# Patient Record
Sex: Female | Born: 1940
Health system: Southern US, Community
[De-identification: ages and names within clinical notes are randomized; demographics above are authoritative.]

## PROBLEM LIST (undated history)

## (undated) DIAGNOSIS — E785 Hyperlipidemia, unspecified: Secondary | ICD-10-CM

## (undated) DIAGNOSIS — I10 Essential (primary) hypertension: Secondary | ICD-10-CM

## (undated) DIAGNOSIS — M1990 Unspecified osteoarthritis, unspecified site: Secondary | ICD-10-CM

## (undated) DIAGNOSIS — R319 Hematuria, unspecified: Secondary | ICD-10-CM

## (undated) DIAGNOSIS — K219 Gastro-esophageal reflux disease without esophagitis: Secondary | ICD-10-CM

## (undated) DIAGNOSIS — E039 Hypothyroidism, unspecified: Secondary | ICD-10-CM

## (undated) HISTORY — PX: EYE SURGERY: SHX253

## (undated) HISTORY — PX: OTHER SURGICAL HISTORY: SHX169

## (undated) HISTORY — DX: Hematuria, unspecified: R31.9

## (undated) HISTORY — PX: JOINT REPLACEMENT: SHX530

## (undated) HISTORY — PX: BACK SURGERY: SHX140

## (undated) HISTORY — PX: HAND SURGERY: SHX662

## (undated) HISTORY — PX: APPENDECTOMY: SHX54

## (undated) HISTORY — PX: VAGINAL HYSTERECTOMY: SHX2639

## (undated) HISTORY — PX: ABDOMINAL HYSTERECTOMY: SHX81

## (undated) HISTORY — DX: Hyperlipidemia, unspecified: E78.5

---

## 1998-12-09 ENCOUNTER — Other Ambulatory Visit: Admission: RE | Admit: 1998-12-09 | Discharge: 1998-12-09 | Payer: Self-pay | Admitting: Obstetrics and Gynecology

## 2000-03-21 ENCOUNTER — Other Ambulatory Visit: Admission: RE | Admit: 2000-03-21 | Discharge: 2000-03-21 | Payer: Self-pay | Admitting: Obstetrics and Gynecology

## 2000-05-16 ENCOUNTER — Ambulatory Visit (HOSPITAL_COMMUNITY): Admission: RE | Admit: 2000-05-16 | Discharge: 2000-05-16 | Payer: Self-pay | Admitting: Orthopedic Surgery

## 2000-05-16 ENCOUNTER — Encounter: Payer: Self-pay | Admitting: Orthopedic Surgery

## 2000-05-31 ENCOUNTER — Encounter: Payer: Self-pay | Admitting: Orthopedic Surgery

## 2000-05-31 ENCOUNTER — Ambulatory Visit (HOSPITAL_COMMUNITY): Admission: RE | Admit: 2000-05-31 | Discharge: 2000-05-31 | Payer: Self-pay | Admitting: Orthopedic Surgery

## 2000-06-17 ENCOUNTER — Encounter: Payer: Self-pay | Admitting: Orthopedic Surgery

## 2000-06-17 ENCOUNTER — Ambulatory Visit (HOSPITAL_COMMUNITY): Admission: RE | Admit: 2000-06-17 | Discharge: 2000-06-17 | Payer: Self-pay | Admitting: Orthopedic Surgery

## 2000-07-01 ENCOUNTER — Ambulatory Visit (HOSPITAL_COMMUNITY): Admission: RE | Admit: 2000-07-01 | Discharge: 2000-07-01 | Payer: Self-pay | Admitting: Internal Medicine

## 2000-07-01 ENCOUNTER — Encounter (HOSPITAL_BASED_OUTPATIENT_CLINIC_OR_DEPARTMENT_OTHER): Payer: Self-pay | Admitting: Internal Medicine

## 2000-10-10 ENCOUNTER — Encounter: Admission: RE | Admit: 2000-10-10 | Discharge: 2000-10-10 | Payer: Self-pay | Admitting: Obstetrics and Gynecology

## 2000-10-10 ENCOUNTER — Encounter: Payer: Self-pay | Admitting: Obstetrics and Gynecology

## 2001-04-03 ENCOUNTER — Other Ambulatory Visit: Admission: RE | Admit: 2001-04-03 | Discharge: 2001-04-03 | Payer: Self-pay | Admitting: Obstetrics and Gynecology

## 2001-11-10 ENCOUNTER — Encounter: Admission: RE | Admit: 2001-11-10 | Discharge: 2001-11-10 | Payer: Self-pay | Admitting: Obstetrics and Gynecology

## 2001-11-10 ENCOUNTER — Encounter: Payer: Self-pay | Admitting: Obstetrics and Gynecology

## 2002-04-05 ENCOUNTER — Other Ambulatory Visit: Admission: RE | Admit: 2002-04-05 | Discharge: 2002-04-05 | Payer: Self-pay | Admitting: Obstetrics and Gynecology

## 2002-11-15 ENCOUNTER — Encounter: Payer: Self-pay | Admitting: Obstetrics and Gynecology

## 2002-11-15 ENCOUNTER — Encounter: Admission: RE | Admit: 2002-11-15 | Discharge: 2002-11-15 | Payer: Self-pay | Admitting: Obstetrics and Gynecology

## 2003-05-13 ENCOUNTER — Encounter: Admission: RE | Admit: 2003-05-13 | Discharge: 2003-05-13 | Payer: Self-pay | Admitting: Obstetrics and Gynecology

## 2003-05-13 ENCOUNTER — Encounter: Payer: Self-pay | Admitting: Obstetrics and Gynecology

## 2003-11-26 ENCOUNTER — Encounter: Admission: RE | Admit: 2003-11-26 | Discharge: 2003-11-26 | Payer: Self-pay | Admitting: Obstetrics and Gynecology

## 2004-12-07 ENCOUNTER — Encounter: Admission: RE | Admit: 2004-12-07 | Discharge: 2004-12-07 | Payer: Self-pay | Admitting: Internal Medicine

## 2005-01-27 ENCOUNTER — Ambulatory Visit: Payer: Self-pay | Admitting: Infectious Diseases

## 2005-01-28 ENCOUNTER — Ambulatory Visit (HOSPITAL_COMMUNITY): Admission: RE | Admit: 2005-01-28 | Discharge: 2005-01-28 | Payer: Self-pay | Admitting: Infectious Diseases

## 2005-03-28 ENCOUNTER — Observation Stay (HOSPITAL_COMMUNITY): Admission: EM | Admit: 2005-03-28 | Discharge: 2005-03-29 | Payer: Self-pay | Admitting: Emergency Medicine

## 2006-02-22 ENCOUNTER — Encounter: Admission: RE | Admit: 2006-02-22 | Discharge: 2006-02-22 | Payer: Self-pay | Admitting: Internal Medicine

## 2006-03-09 ENCOUNTER — Encounter: Admission: RE | Admit: 2006-03-09 | Discharge: 2006-03-09 | Payer: Self-pay | Admitting: Internal Medicine

## 2006-08-15 ENCOUNTER — Ambulatory Visit (HOSPITAL_COMMUNITY): Admission: RE | Admit: 2006-08-15 | Discharge: 2006-08-16 | Payer: Self-pay | Admitting: Surgery

## 2006-08-15 ENCOUNTER — Encounter (INDEPENDENT_AMBULATORY_CARE_PROVIDER_SITE_OTHER): Payer: Self-pay | Admitting: *Deleted

## 2006-11-25 ENCOUNTER — Encounter: Admission: RE | Admit: 2006-11-25 | Discharge: 2006-11-25 | Payer: Self-pay | Admitting: Obstetrics and Gynecology

## 2007-03-23 ENCOUNTER — Encounter: Admission: RE | Admit: 2007-03-23 | Discharge: 2007-03-23 | Payer: Self-pay | Admitting: Internal Medicine

## 2008-04-30 ENCOUNTER — Encounter: Admission: RE | Admit: 2008-04-30 | Discharge: 2008-04-30 | Payer: Self-pay | Admitting: Internal Medicine

## 2009-05-01 ENCOUNTER — Encounter: Admission: RE | Admit: 2009-05-01 | Discharge: 2009-05-01 | Payer: Self-pay | Admitting: Internal Medicine

## 2009-11-07 DIAGNOSIS — I319 Disease of pericardium, unspecified: Secondary | ICD-10-CM | POA: Insufficient documentation

## 2009-11-07 DIAGNOSIS — E785 Hyperlipidemia, unspecified: Secondary | ICD-10-CM | POA: Insufficient documentation

## 2009-11-07 DIAGNOSIS — K2990 Gastroduodenitis, unspecified, without bleeding: Secondary | ICD-10-CM | POA: Insufficient documentation

## 2009-11-14 DIAGNOSIS — Z7901 Long term (current) use of anticoagulants: Secondary | ICD-10-CM | POA: Insufficient documentation

## 2010-05-06 ENCOUNTER — Encounter: Admission: RE | Admit: 2010-05-06 | Discharge: 2010-05-06 | Payer: Self-pay | Admitting: Internal Medicine

## 2010-07-21 ENCOUNTER — Emergency Department (HOSPITAL_COMMUNITY): Admission: EM | Admit: 2010-07-21 | Discharge: 2010-07-21 | Payer: Self-pay | Admitting: Emergency Medicine

## 2010-12-30 LAB — BASIC METABOLIC PANEL
BUN: 23 mg/dL (ref 6–23)
CO2: 26 mEq/L (ref 19–32)
Calcium: 9.5 mg/dL (ref 8.4–10.5)
Chloride: 108 mEq/L (ref 96–112)
Creatinine, Ser: 0.62 mg/dL (ref 0.4–1.2)
GFR calc Af Amer: >60 mL/min (ref >60)
GFR calc non Af Amer: >60 mL/min (ref >60)
Glucose, Bld: 108 mg/dL — ABNORMAL HIGH (ref 70–99)
Potassium: 4.2 mEq/L (ref 3.5–5.1)
Sodium: 142 mEq/L (ref 135–145)

## 2010-12-30 LAB — DIFFERENTIAL
Basophils Absolute: 0 10*3/uL (ref 0.0–0.1)
Basophils Relative: 0 % (ref 0–1)
Eosinophils Absolute: 0.1 10*3/uL (ref 0.0–0.7)
Eosinophils Relative: 2 % (ref 0–5)
Lymphocytes Relative: 39 % (ref 12–46)
Lymphs Abs: 2 10*3/uL (ref 0.7–4.0)
Monocytes Absolute: 0.4 10*3/uL (ref 0.1–1.0)
Monocytes Relative: 9 % (ref 3–12)
Neutro Abs: 2.6 10*3/uL (ref 1.7–7.7)
Neutrophils Relative %: 50 % (ref 43–77)

## 2010-12-30 LAB — URINE MICROSCOPIC-ADD ON

## 2010-12-30 LAB — URINALYSIS, ROUTINE W REFLEX MICROSCOPIC
Bilirubin Urine: NEGATIVE
Ketones, ur: NEGATIVE mg/dL
Leukocytes, UA: NEGATIVE
Nitrite: NEGATIVE
Protein, ur: NEGATIVE mg/dL
Specific Gravity, Urine: 1.018 (ref 1.005–1.030)
Urine Glucose, Fasting: NEGATIVE mg/dL
Urobilinogen, UA: 0.2 mg/dL (ref 0.0–1.0)
pH: 7 (ref 5.0–8.0)

## 2010-12-30 LAB — CBC
HCT: 40.5 % (ref 36.0–46.0)
Hemoglobin: 13.4 g/dL (ref 12.0–15.0)
MCH: 31.3 pg (ref 26.0–34.0)
MCHC: 33.1 g/dL (ref 30.0–36.0)
MCV: 94.6 fL (ref 78.0–100.0)
Platelets: 287 10*3/uL (ref 150–400)
RBC: 4.28 MIL/uL (ref 3.87–5.11)
RDW: 13 % (ref 11.5–15.5)
WBC: 5.1 10*3/uL (ref 4.0–10.5)

## 2010-12-30 LAB — PROTIME-INR
INR: 0.97 (ref 0.00–1.49)
Prothrombin Time: 13.1 seconds (ref 11.6–15.2)

## 2010-12-30 LAB — TYPE AND SCREEN
ABO/RH(D): A POS
Antibody Screen: NEGATIVE

## 2010-12-30 LAB — ABO/RH: ABO/RH(D): A POS

## 2010-12-30 LAB — APTT: aPTT: 27 seconds (ref 24–37)

## 2011-01-04 ENCOUNTER — Ambulatory Visit (HOSPITAL_COMMUNITY)
Admission: RE | Admit: 2011-01-04 | Discharge: 2011-01-06 | Payer: Self-pay | Source: Home / Self Care | Attending: Orthopedic Surgery | Admitting: Orthopedic Surgery

## 2011-01-11 LAB — CBC
HCT: 36.3 % (ref 36.0–46.0)
Hemoglobin: 12.2 g/dL (ref 12.0–15.0)
MCH: 32 pg (ref 26.0–34.0)
MCHC: 33.6 g/dL (ref 30.0–36.0)
MCV: 95.3 fL (ref 78.0–100.0)
Platelets: 254 10*3/uL (ref 150–400)
RBC: 3.81 MIL/uL — ABNORMAL LOW (ref 3.87–5.11)
RDW: 13 % (ref 11.5–15.5)
WBC: 7.4 10*3/uL (ref 4.0–10.5)

## 2011-01-11 LAB — BASIC METABOLIC PANEL
BUN: 10 mg/dL (ref 6–23)
CO2: 28 mEq/L (ref 19–32)
Calcium: 8.9 mg/dL (ref 8.4–10.5)
Chloride: 105 mEq/L (ref 96–112)
Creatinine, Ser: 0.62 mg/dL (ref 0.4–1.2)
GFR calc Af Amer: >60 mL/min (ref >60)
GFR calc non Af Amer: >60 mL/min (ref >60)
Glucose, Bld: 125 mg/dL — ABNORMAL HIGH (ref 70–99)
Potassium: 3.7 mEq/L (ref 3.5–5.1)
Sodium: 140 mEq/L (ref 135–145)

## 2011-01-17 ENCOUNTER — Encounter (HOSPITAL_BASED_OUTPATIENT_CLINIC_OR_DEPARTMENT_OTHER): Payer: Self-pay | Admitting: Internal Medicine

## 2011-01-18 NOTE — H&P (Signed)
NAMESHARLENE, MCCLUSKEY              ACCOUNT NO.:  [id number]  MEDICAL RECORD NO.:  [id number]          PATIENT TYPE:  INP  LOCATION:  NA                           FACILITY:  [facility]  PHYSICIAN:  Madlyn Frankel. Stephanie Baker, M.D.  DATE OF BIRTH:  [birth year]  DATE OF ADMISSION: DATE OF DISCHARGE:                             HISTORY & PHYSICAL   ADMISSION DIAGNOSIS:  Right knee medial osteoarthritis with a planned right hemi-knee arthroplasty.  BRIEF HISTORY:  This patient is well known to this practice and has been seen by Dr. Shelle Iron with this practice for some time due to right knee pain.  She has had progressive problems and pain on the medial side of her knee and underwent an MRI which Dr. Charlann Baker reviewed with her at last appointment.  She has been using a cane and feels like the discomfort worsened to the point that she has failed conservative treatment and ready to proceed with hemiarthroplasty on that side.  PAST MEDICAL HISTORY:  Significant for: 1. Impaired vision.  She does wear corrective lenses. 2. She has hypertension, is well controlled. 3. She is postmenopausal and she has generalized osteoarthritis with     some joint pain, swelling, and morning stiffness.  SURGICAL HISTORY:  Significant for: 1. Repair of MP joint of her left thumb. 2. She had an appendectomy in the past and hysterectomy in the past.  CURRENT MEDICATION LIST:  Benicar HCT 40/12.5 mg every day, amlodipine 3.5 mg a day, potassium 20 mEq a day, vitamin D daily, Biotin daily, Premarin and fish oil omega 3 daily, low-dose aspirin every day, and meloxicam 7.5 daily.  ALLERGIES:  She has no medicine allergies.  SOCIAL HISTORY:  The patient is married.  She was a physical therapist. She has a past history of tobacco use.  She drinks alcohol socially. She has no history of street drug abuse.  FAMILY HISTORY:  Her father died at 8 of melanoma.  Mother had a heart attack at 90.  She has 2 children.  DISPOSITION PLAN:   Home after discharge.  REVIEW OF SYSTEMS:  Notable for the difficulties described in the history of present illness and past medical history.  Review of systems is otherwise unremarkable.  PHYSICAL EXAM:  GENERAL:  Shows the patient to be at 550 pounds. General health is good.  She is postmenopausal. VITAL SIGNS:  Her blood pressure today is 104/66, respirations 20, pulse 80. HEENT:  Shows corrective lenses only. NECK AND CHEST:  Unremarkable.  Clear to auscultation. HEART:  S1, S2.  No murmurs, rubs, or gallops.  She does have history of hypertension. ABDOMEN:  Soft, nondistended. GI/GU:  Otherwise unremarkable. EXTREMITIES:  Exam shows history osteoarthritis. DERMATOLOGIC/NEUROLOGIC:  She is intact.  LABS:  EKG and chest x-ray are pending through [facility].  IMPRESSION:  Right medial knee osteoarthritis with pending hemiarthroplasty.  PLAN:  She will be admitted on the 9th for the hemiarthroplasty with Dr. Charlann Baker.  Her questions were encouraged and answered.     Russell L. Webb Silversmith, RN   ______________________________ Madlyn Frankel Stephanie Baker, M.D.    RLW/MEDQ  D:  12/31/2010  T:  12/31/2010  Job:  [id number]  Electronically Signed by Lauree Chandler NP-C on 01/15/2011 09:46:46 AM Electronically Signed by Durene Romans M.D. on 01/18/2011 09:33:01 AM

## 2011-01-21 LAB — SURGICAL PCR SCREEN: MRSA, PCR: NEGATIVE

## 2011-04-19 ENCOUNTER — Other Ambulatory Visit (HOSPITAL_BASED_OUTPATIENT_CLINIC_OR_DEPARTMENT_OTHER): Payer: Self-pay | Admitting: Internal Medicine

## 2011-04-19 DIAGNOSIS — Z1231 Encounter for screening mammogram for malignant neoplasm of breast: Secondary | ICD-10-CM

## 2011-05-12 ENCOUNTER — Ambulatory Visit
Admission: RE | Admit: 2011-05-12 | Discharge: 2011-05-12 | Disposition: A | Discharge status: Home / Self Care | Payer: Medicare Other | Source: Ambulatory Visit | Attending: Internal Medicine | Admitting: Internal Medicine

## 2011-05-12 DIAGNOSIS — Z1231 Encounter for screening mammogram for malignant neoplasm of breast: Secondary | ICD-10-CM

## 2011-05-14 NOTE — Op Note (Signed)
NAMEDEXTER, SAUSER              ACCOUNT NO.:  [id number]   MEDICAL RECORD NO.:  [id number]          PATIENT TYPE:  AMB   LOCATION:  DAY                          FACILITY:  [facility]   PHYSICIAN:  Currie Paris, M.D.DATE OF BIRTH:  March 28, 1941   DATE OF PROCEDURE:  08/15/2006  DATE OF DISCHARGE:                                 OPERATIVE REPORT   PREOPERATIVE DIAGNOSIS:  Chronic appendicitis.   POSTOPERATIVE DIAGNOSIS:  Chronic appendicitis.   OPERATION:  Laparoscopic appendectomy.   SURGEON:  Dr. Jamey Ripa.   ANESTHESIA:  General.   CLINICAL HISTORY:  This is a 70 year old lady who about 7 or 8 weeks ago had  an episode of appendicitis which presented as a phlegmon.  She was treated  nonoperatively with IV antibiotics and oral antibiotics.  Follow-up CT scan  has showed marked resolution of the inflammatory process.  She was brought  in electively for an interval appendectomy.   DESCRIPTION OF PROCEDURE:  The patient was seen in the holding area, and she  had no further questions.  We confirmed appendectomy as the planned  procedure.   She was taken to the operating room, and after satisfactory general  anesthesia had been obtained, Foley catheter was placed and the abdomen  prepped and draped.  The time-out occurred.   A 0.25% plain Marcaine for each incision.  The umbilical incision was made.  The fascia identified and opened, and the peritoneal cavity entered under  direct vision.  A pursestring was placed, the [facility] introduced and the  abdomen insufflated to 15.  With the camera placed, I put a 5-mm trocar in  the right upper quadrant and a 10/11 in the left lower quadrant.  Exploration of the abdomen with a scope showed no gross abnormalities.  There was some inflammatory process over by the appendix.   With the camera in the left lower quadrant port, I was able to free up some  of the chronic inflammatory changes around lateral edge of the cecum and  identify the  tip of the appendix.  With that grasped, I was able to see that  there was a little fat from the terminal ileum that was stuck to the  appendix, and this was taken down with the harmonic.  This gave me a little  better exposure, and it appeared she had perforated her appendix near the  tip.  I was able to grasp the appendix just below that.  I then was able to  divide the mesoappendix with the harmonic.   Once I had this divided down to the base the appendix, the GIA was placed  through the umbilical port and the appendix divided at the base.  Appendix  was placed in a bag and brought out the umbilical port.   The abdomen was reinsufflated.  I irrigated and made sure the area was dry,  that staple line appeared intact and there  were no other problems.  The ports were then removed.  The 5-mm port was  removed, and there was no bleeding.  I then removed the 10/11 port under  direct vision.  There was no bleeding.  The umbilical port was removed and  the  abdomen deflated and a pursestring tied down to close this.  The skin was  closed with 4-0 Monocryl subcuticular.  I alsoput a fascial suture in left  lower quadrant incision as well using a 0 Vicryl.   The patient tolerated the procedure well.  There were no complications, and  all counts were correct.      Currie Paris, M.D.  Electronically Signed     CJS/MEDQ  D:  08/15/2006  T:  08/15/2006  Job:  [id number]   cc:   Barry Dienes. Eloise Harman, M.D.  Fax: [phone]   Vania Rea. Jarold Motto, MD, FACG, FACP  [address]  [place]  [place] [address]

## 2011-05-14 NOTE — H&P (Signed)
NAMENICOLETTA, HUSH NO.:  [id number]   MEDICAL RECORD NO.:  [id number]          PATIENT TYPE:  INP   LOCATION:  2017                         FACILITY:  [facility]   PHYSICIAN:  Darden Palmer., M.D.DATE OF BIRTH:  [birth year]   DATE OF ADMISSION:  03/28/2005  DATE OF DISCHARGE:                                HISTORY & PHYSICAL   HISTORY:  This is a 70 year old female who was admitted as an emergency  patient for evaluation of chest pain. The patient has a long history of  hypertension which has been somewhat difficult to control by her primary  doctor and was recently started on Norvasc. She reports a 2 to 3 day history  of atypical right-sided chest pain. She describes this pain as being worse  with lying down and it was described as an intense right-sided pain that  would occasionally become worse and stabbing. The pain has persisted but  intensified last night. It worsened when she was up or about and perhaps  when she would bend over. Associated with this would be a numbness and heavy  feeling involving her right arm. Because of the pain she presented to the  emergency room. She is having minimal pain now but still some pleuritic  features to it and is admitted to rule out a myocardial infarction.   PAST MEDICAL HISTORY:  1.  Remarkable for hypertension.  2.  Remote history of hyperlipidemia.  3.  There have been some mild reflux in the past.   PREVIOUS SURGERY:  Hysterectomy and hand surgery.   ALLERGIES:  None.   CURRENT MEDICATIONS:  1.  Micardis 80 mg daily.  2.  Toprol XL 12.5 mg daily.  3.  Norvasc 2.5 mg daily.  4.  Aspirin daily.  5.  Premarin.   FAMILY HISTORY:  Mother died of an infarction at age 62. Father died of  melanoma at age 66. She is the oldest of four children. Her three siblings  are healthy except for a sister with diabetes.   SOCIAL HISTORY:  She smoked heavily but quit 15 years ago, smoked 1 to 1.5  packs of cigarettes  per day for around 30 years. She drinks 2 to 3 glasses  of wine per day. She is married. Her husband is a retired Company secretary. 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   REVIEW OF SYSTEMS:  A 10 pound weight gain. No skin problems. No eye, ear,  nose or throat problems. No difficulty swallowing. Remote history of hiatal  hernia and reflux symptoms but these do not resemble that. No urinary tract  infections, or other symptoms.  Occasional mild arthritis. No history of  stroke or TIA.   PHYSICAL EXAMINATION:  GENERAL:  She is a pleasant female appearing her  stated age and in no acute distress.  VITAL SIGNS:  Blood pressure is 160/70, pulse 72.  SKIN:  Warm and dry.  HEENT:  PERRLA, CNS clear. Fundi not examined. Pharynx negative.  NECK:  Supple without masses, JVD, thyromegaly or bruits.  LUNGS:  Clear to A&P.  CARDIAC EXAM:  Normal S1 and S2, no S3. No murmur.  ABDOMEN:  Soft, nontender, no mass, hepatosplenomegaly or aneurysm. Femoral  distal pulses 2+, no edema.   A 12-lead EKG shows left anterior fascicular block, there is left atrial  enlargement also noted.  Chest x-ray is normal. She has a slight monocytosis  on her lab work. CBC and initial cardiac enzymes were all normal.   IMPRESSION:  1.  Right-sided chest pain with some atypical features. Rule out pulmonary      embolus, myocardial infarction or primary pulmonary problem.  2.  Hypertension which has been difficult to control.  3.  History of hyperlipidemia previously tried on Zetia. Questionable      intolerance to statins in the past but incomplete data base.   RECOMMENDATIONS:  Admit, begin Lovenox, increase Norvasc. Check CT scan of  chest. Further work up per Dr. Patty Sermons.      WST/MEDQ  D:  03/28/2005  T:  03/28/2005  Job:  [id number]   cc:   Barry Dienes. Eloise Harman, M.D.  [address]  [place]  [place] [address]  Fax: [phone]   Cassell Clement, M.D.  [address]  [place]  [place] [address]  Fax: [phone]

## 2012-01-07 DIAGNOSIS — M19079 Primary osteoarthritis, unspecified ankle and foot: Secondary | ICD-10-CM | POA: Diagnosis not present

## 2012-03-01 DIAGNOSIS — D1801 Hemangioma of skin and subcutaneous tissue: Secondary | ICD-10-CM | POA: Diagnosis not present

## 2012-03-01 DIAGNOSIS — L821 Other seborrheic keratosis: Secondary | ICD-10-CM | POA: Diagnosis not present

## 2012-03-01 DIAGNOSIS — D239 Other benign neoplasm of skin, unspecified: Secondary | ICD-10-CM | POA: Diagnosis not present

## 2012-03-03 DIAGNOSIS — H02059 Trichiasis without entropian unspecified eye, unspecified eyelid: Secondary | ICD-10-CM | POA: Diagnosis not present

## 2012-03-03 DIAGNOSIS — H01009 Unspecified blepharitis unspecified eye, unspecified eyelid: Secondary | ICD-10-CM | POA: Diagnosis not present

## 2012-04-03 ENCOUNTER — Other Ambulatory Visit: Payer: Self-pay | Admitting: Internal Medicine

## 2012-04-03 DIAGNOSIS — Z1231 Encounter for screening mammogram for malignant neoplasm of breast: Secondary | ICD-10-CM

## 2012-04-06 DIAGNOSIS — H04129 Dry eye syndrome of unspecified lacrimal gland: Secondary | ICD-10-CM | POA: Diagnosis not present

## 2012-04-06 DIAGNOSIS — H01009 Unspecified blepharitis unspecified eye, unspecified eyelid: Secondary | ICD-10-CM | POA: Diagnosis not present

## 2012-05-12 DIAGNOSIS — M1710 Unilateral primary osteoarthritis, unspecified knee: Secondary | ICD-10-CM | POA: Diagnosis not present

## 2012-05-12 DIAGNOSIS — IMO0002 Reserved for concepts with insufficient information to code with codable children: Secondary | ICD-10-CM | POA: Diagnosis not present

## 2012-05-15 ENCOUNTER — Ambulatory Visit
Admission: RE | Admit: 2012-05-15 | Discharge: 2012-05-15 | Disposition: A | Discharge status: Home / Self Care | Payer: PRIVATE HEALTH INSURANCE | Source: Ambulatory Visit | Attending: Internal Medicine | Admitting: Internal Medicine

## 2012-05-15 DIAGNOSIS — Z1231 Encounter for screening mammogram for malignant neoplasm of breast: Secondary | ICD-10-CM | POA: Diagnosis not present

## 2012-09-18 DIAGNOSIS — L820 Inflamed seborrheic keratosis: Secondary | ICD-10-CM | POA: Diagnosis not present

## 2012-09-18 DIAGNOSIS — D485 Neoplasm of uncertain behavior of skin: Secondary | ICD-10-CM | POA: Diagnosis not present

## 2012-09-18 DIAGNOSIS — L819 Disorder of pigmentation, unspecified: Secondary | ICD-10-CM | POA: Diagnosis not present

## 2012-09-18 DIAGNOSIS — L578 Other skin changes due to chronic exposure to nonionizing radiation: Secondary | ICD-10-CM | POA: Diagnosis not present

## 2012-10-10 DIAGNOSIS — Z23 Encounter for immunization: Secondary | ICD-10-CM | POA: Diagnosis not present

## 2012-10-20 DIAGNOSIS — IMO0002 Reserved for concepts with insufficient information to code with codable children: Secondary | ICD-10-CM | POA: Diagnosis not present

## 2012-10-20 DIAGNOSIS — M1710 Unilateral primary osteoarthritis, unspecified knee: Secondary | ICD-10-CM | POA: Diagnosis not present

## 2012-10-25 DIAGNOSIS — M1710 Unilateral primary osteoarthritis, unspecified knee: Secondary | ICD-10-CM | POA: Diagnosis not present

## 2012-10-25 DIAGNOSIS — IMO0002 Reserved for concepts with insufficient information to code with codable children: Secondary | ICD-10-CM | POA: Diagnosis not present

## 2012-10-26 ENCOUNTER — Encounter (HOSPITAL_COMMUNITY): Payer: Self-pay | Admitting: Pharmacy Technician

## 2012-11-01 ENCOUNTER — Encounter (HOSPITAL_COMMUNITY): Payer: Self-pay

## 2012-11-01 ENCOUNTER — Encounter (HOSPITAL_COMMUNITY)
Admission: RE | Admit: 2012-11-01 | Discharge: 2012-11-01 | Disposition: A | Discharge status: Home / Self Care | Payer: Medicare Other | Source: Ambulatory Visit | Attending: Orthopedic Surgery | Admitting: Orthopedic Surgery

## 2012-11-01 ENCOUNTER — Ambulatory Visit (HOSPITAL_COMMUNITY)
Admission: RE | Admit: 2012-11-01 | Discharge: 2012-11-01 | Disposition: A | Discharge status: Home / Self Care | Payer: Medicare Other | Source: Ambulatory Visit | Attending: Orthopedic Surgery | Admitting: Orthopedic Surgery

## 2012-11-01 DIAGNOSIS — I10 Essential (primary) hypertension: Secondary | ICD-10-CM | POA: Insufficient documentation

## 2012-11-01 DIAGNOSIS — Z87891 Personal history of nicotine dependence: Secondary | ICD-10-CM | POA: Insufficient documentation

## 2012-11-01 DIAGNOSIS — Z01818 Encounter for other preprocedural examination: Secondary | ICD-10-CM | POA: Insufficient documentation

## 2012-11-01 HISTORY — DX: Essential (primary) hypertension: I10

## 2012-11-01 HISTORY — DX: Gastro-esophageal reflux disease without esophagitis: K21.9

## 2012-11-01 LAB — URINALYSIS, ROUTINE W REFLEX MICROSCOPIC
Bilirubin Urine: NEGATIVE
Nitrite: NEGATIVE
Protein, ur: NEGATIVE mg/dL
Specific Gravity, Urine: 1.018 (ref 1.005–1.030)
Urobilinogen, UA: 0.2 mg/dL (ref 0.0–1.0)

## 2012-11-01 LAB — CBC
Hemoglobin: 14.1 g/dL (ref 12.0–15.0)
RBC: 4.5 MIL/uL (ref 3.87–5.11)

## 2012-11-01 LAB — BASIC METABOLIC PANEL
GFR calc Af Amer: >90 mL/min (ref >90)
GFR calc non Af Amer: 89 mL/min — ABNORMAL LOW (ref >90)
Potassium: 3.8 mEq/L (ref 3.5–5.1)
Sodium: 137 mEq/L (ref 135–145)

## 2012-11-01 LAB — SURGICAL PCR SCREEN: Staphylococcus aureus: NEGATIVE

## 2012-11-01 LAB — APTT: aPTT: 28 seconds (ref 24–37)

## 2012-11-01 LAB — PROTIME-INR
INR: 0.89 (ref 0.00–1.49)
Prothrombin Time: 12 seconds (ref 11.6–15.2)

## 2012-11-01 NOTE — Patient Instructions (Signed)
[address] T Ammirati  11/01/2012   Your procedure is scheduled on:  11/14/12  Tuesday   Surgery  [id number]  Report to [facility] at    0515   AM.  Call this number if you have problems the morning of surgery: [phone]      Remember:   Do not eat food  Or drink :After Midnight.   Monday NIGHT   Take these medicines the morning of surgery with A SIP OF WATER: AMLODIPINE   .  Contacts, dentures or partial plates can not be worn to surgery  Leave suitcase in the car. After surgery it may be brought to your room.  For patients admitted to the hospital, checkout time is 11:00 AM day of  discharge.             SPECIAL INSTRUCTIONS- SEE [place] PREPARING FOR SURGERY INSTRUCTION SHEET-     DO NOT WEAR JEWELRY, LOTIONS, POWDERS, OR PERFUMES.  WOMEN-- DO NOT SHAVE LEGS OR UNDERARMS FOR 12 HOURS BEFORE SHOWERS. MEN MAY SHAVE FACE.  Patients discharged the day of surgery will not be allowed to drive home. IF going home the day of surgery, you must have a driver and someone to stay with you for the first 24 hours  Name and phone number of your driver: will be admitted                                                                       Please read over the following fact sheets that you were given: MRSA Information, Incentive Spirometry Sheet, Blood Transfusion Sheet  Information                                                                                   Pattiann Solanki  PST [phone]  [phone]

## 2012-11-01 NOTE — Progress Notes (Signed)
Faxed abnormal urine with micro, CMET to Dr Charlann Boxer with note at 1230 that patient is leaving to go out of country and will be unavailable after 0530 tomorrow  with confirmation was received.   1505- patient notified of neg pcr at her request- she stated she was informed by Dr Boneta Lucks office that medication has been called in for UTI

## 2012-11-02 NOTE — Progress Notes (Signed)
Clearance Dr Paterson on chart 

## 2012-11-12 NOTE — H&P (Signed)
TOTAL KNEE ADMISSION H&P  Patient is being admitted for left medial unicompartment knee arthroplasty.  Subjective:  Chief Complaint:  Left knee medial compartment OA / pain.  HPI: Stephanie Baker, 71 y.o. female, has a history of pain and functional disability in the left knee due to arthritis and has failed non-surgical conservative treatments for greater than 12 weeks to includeNSAID's and/or analgesics and activity modification.  Onset of symptoms was gradual, starting 3 years ago with gradually worsening course since that time. The patient noted no past surgery on the left knee(s).  Patient currently rates pain in the left knee(s) at 7 out of 10 with activity. Patient has worsening of pain with activity and weight bearing, pain that interferes with activities of daily living, crepitus and joint swelling.  Patient has evidence of periarticular osteophytes and joint space narrowing by imaging studies. Risks, benefits and expectations were discussed with the patient. Patient understand the risks, benefits and expectations and wishes to proceed with surgery.   D/C Plans:  Home with HHPT  Post-op Meds:   Rx given for ASA, Robaxin, Celebrex, Iron, Colace and MiraLax  Tranexamic Acid:  To be given  Decadron:   To be given  FYI:  Hydrocodone is ok to try at first, if causes any effects then switch to Toradol and tramadol  Past Medical History  Diagnosis Date  . Hypertension   . GERD (gastroesophageal reflux disease)     Past Surgical History  Procedure Date  . Vaginal hysterectomy   . Appendectomy   . Joint replacement     right knee  . Thumb surgery     left  . Eye surgery     bilateral cataract extraction with IOL    No prescriptions prior to admission   No Known Allergies  History  Substance Use Topics  . Smoking status: Never Smoker   . Smokeless tobacco: Never Used  . Alcohol Use: Yes     Comment: wine nightly   1 glass       Review of Systems  Constitutional:  Negative.   HENT: Negative.   Eyes: Negative.   Respiratory: Negative.   Cardiovascular: Negative.   Gastrointestinal: Negative.   Genitourinary: Negative.   Musculoskeletal: Positive for joint pain.  Skin: Negative.   Neurological: Negative.   Endo/Heme/Allergies: Negative.   Psychiatric/Behavioral: Negative.     Objective:  Physical Exam  Constitutional: She is oriented to person, place, and time. She appears well-developed and well-nourished.  HENT:  Head: Normocephalic and atraumatic.  Mouth/Throat: Oropharynx is clear and moist.  Eyes: Pupils are equal, round, and reactive to light.  Neck: Neck supple. No JVD present. No tracheal deviation present. No thyromegaly present.  Cardiovascular: Normal rate, regular rhythm, normal heart sounds and intact distal pulses.   Respiratory: Effort normal and breath sounds normal. No stridor. No respiratory distress. She has no wheezes.  GI: Soft. There is no tenderness. There is no guarding.  Lymphadenopathy:    She has no cervical adenopathy.  Neurological: She is alert and oriented to person, place, and time.  Skin: Skin is warm and dry.  Psychiatric: She has a normal mood and affect.    Vital signs in last 24 hours: BP : 110/70 ; HR : 77 ; Resp : 14 ;   Imaging Review Plain radiographs demonstrate mild degenerative joint disease of the left knee(s). The overall alignment is neutral. The bone quality appears to be good for age and reported activity level.  Assessment/Plan:  End stage arthritis, left knee   The patient history, physical examination, clinical judgment of the provider and imaging studies are consistent with end stage degenerative joint disease of the left knee(s) and total knee arthroplasty is deemed medically necessary. The treatment options including medical management, injection therapy arthroscopy and arthroplasty were discussed at length. The risks and benefits of total knee arthroplasty were presented and  reviewed. The risks due to aseptic loosening, infection, stiffness, patella tracking problems, thromboembolic complications and other imponderables were discussed. The patient acknowledged the explanation, agreed to proceed with the plan and consent was signed. Patient is being admitted for inpatient treatment for surgery, pain control, PT, OT, prophylactic antibiotics, VTE prophylaxis, progressive ambulation and ADL's and discharge planning. The patient is planning to be discharged home with home health services.     Anastasio Auerbach Marcelis Wissner   PAC  11/12/2012, 7:32 PM

## 2012-11-13 MED ORDER — TRANEXAMIC ACID 100 MG/ML IV SOLN
15.0000 mg/kg | Freq: Once | INTRAVENOUS | Status: AC
  Administered 2012-11-14: 1050 mg via INTRAVENOUS
  Filled 2012-11-13: qty 10.5

## 2012-11-14 ENCOUNTER — Encounter (HOSPITAL_COMMUNITY)
Admission: RE | Disposition: A | Discharge status: Home Health | Payer: Self-pay | Source: Ambulatory Visit | Attending: Orthopedic Surgery

## 2012-11-14 ENCOUNTER — Encounter (HOSPITAL_COMMUNITY): Payer: Self-pay | Admitting: Anesthesiology

## 2012-11-14 ENCOUNTER — Encounter (HOSPITAL_COMMUNITY): Payer: Self-pay | Admitting: *Deleted

## 2012-11-14 ENCOUNTER — Inpatient Hospital Stay (HOSPITAL_COMMUNITY)
Admission: RE | Admit: 2012-11-14 | Discharge: 2012-11-15 | DRG: 470 | Disposition: A | Discharge status: Home Health | Payer: Medicare Other | Source: Ambulatory Visit | Attending: Orthopedic Surgery | Admitting: Orthopedic Surgery

## 2012-11-14 ENCOUNTER — Inpatient Hospital Stay (HOSPITAL_COMMUNITY): Payer: Medicare Other | Admitting: Anesthesiology

## 2012-11-14 DIAGNOSIS — Z9089 Acquired absence of other organs: Secondary | ICD-10-CM | POA: Diagnosis not present

## 2012-11-14 DIAGNOSIS — Z96652 Presence of left artificial knee joint: Secondary | ICD-10-CM

## 2012-11-14 DIAGNOSIS — Z90710 Acquired absence of both cervix and uterus: Secondary | ICD-10-CM | POA: Diagnosis not present

## 2012-11-14 DIAGNOSIS — E663 Overweight: Secondary | ICD-10-CM | POA: Diagnosis present

## 2012-11-14 DIAGNOSIS — Z6825 Body mass index (BMI) 25.0-25.9, adult: Secondary | ICD-10-CM

## 2012-11-14 DIAGNOSIS — M1710 Unilateral primary osteoarthritis, unspecified knee: Secondary | ICD-10-CM | POA: Diagnosis not present

## 2012-11-14 DIAGNOSIS — K219 Gastro-esophageal reflux disease without esophagitis: Secondary | ICD-10-CM | POA: Diagnosis not present

## 2012-11-14 DIAGNOSIS — D62 Acute posthemorrhagic anemia: Secondary | ICD-10-CM | POA: Diagnosis not present

## 2012-11-14 DIAGNOSIS — Z96659 Presence of unspecified artificial knee joint: Secondary | ICD-10-CM | POA: Diagnosis not present

## 2012-11-14 DIAGNOSIS — I10 Essential (primary) hypertension: Secondary | ICD-10-CM | POA: Diagnosis not present

## 2012-11-14 DIAGNOSIS — Z79899 Other long term (current) drug therapy: Secondary | ICD-10-CM | POA: Diagnosis not present

## 2012-11-14 DIAGNOSIS — IMO0002 Reserved for concepts with insufficient information to code with codable children: Secondary | ICD-10-CM | POA: Diagnosis not present

## 2012-11-14 DIAGNOSIS — M13869 Other specified arthritis, unspecified knee: Secondary | ICD-10-CM | POA: Diagnosis not present

## 2012-11-14 DIAGNOSIS — D500 Iron deficiency anemia secondary to blood loss (chronic): Secondary | ICD-10-CM

## 2012-11-14 HISTORY — PX: PARTIAL KNEE ARTHROPLASTY: SHX2174

## 2012-11-14 LAB — TYPE AND SCREEN: ABO/RH(D): A POS

## 2012-11-14 SURGERY — ARTHROPLASTY, KNEE, UNICOMPARTMENTAL
Anesthesia: Spinal | Site: Knee | Laterality: Left | Wound class: Clean

## 2012-11-14 MED ORDER — RIVAROXABAN 10 MG PO TABS
10.0000 mg | ORAL_TABLET | Freq: Every day | ORAL | Status: DC
  Administered 2012-11-15: 10 mg via ORAL
  Filled 2012-11-14 (×2): qty 1

## 2012-11-14 MED ORDER — HYDROCHLOROTHIAZIDE 12.5 MG PO CAPS
12.5000 mg | ORAL_CAPSULE | Freq: Every day | ORAL | Status: DC
  Administered 2012-11-14 – 2012-11-15 (×2): 12.5 mg via ORAL
  Filled 2012-11-14 (×2): qty 1

## 2012-11-14 MED ORDER — ACETAMINOPHEN 650 MG RE SUPP: 650.0000 mg | Freq: Four times a day (QID) | RECTAL | Status: DC | PRN

## 2012-11-14 MED ORDER — KETOROLAC TROMETHAMINE 30 MG/ML IJ SOLN
INTRAMUSCULAR | Status: DC | PRN
  Administered 2012-11-14: 30 mg

## 2012-11-14 MED ORDER — POTASSIUM CHLORIDE CRYS ER 20 MEQ PO TBCR
20.0000 meq | EXTENDED_RELEASE_TABLET | Freq: Every day | ORAL | Status: DC
  Administered 2012-11-14 – 2012-11-15 (×2): 20 meq via ORAL
  Filled 2012-11-14 (×2): qty 1

## 2012-11-14 MED ORDER — BUPIVACAINE-EPINEPHRINE 0.25% -1:200000 IJ SOLN
INTRAMUSCULAR | Status: DC | PRN
  Administered 2012-11-14: 30 mL

## 2012-11-14 MED ORDER — ONDANSETRON HCL 4 MG/2ML IJ SOLN: 4.0000 mg | Freq: Four times a day (QID) | INTRAMUSCULAR | Status: DC | PRN

## 2012-11-14 MED ORDER — HYDROMORPHONE HCL PF 1 MG/ML IJ SOLN: 0.2500 mg | INTRAMUSCULAR | Status: DC | PRN

## 2012-11-14 MED ORDER — ONDANSETRON HCL 4 MG PO TABS: 4.0000 mg | ORAL_TABLET | Freq: Four times a day (QID) | ORAL | Status: DC | PRN

## 2012-11-14 MED ORDER — EPHEDRINE SULFATE 50 MG/ML IJ SOLN
INTRAMUSCULAR | Status: DC | PRN
  Administered 2012-11-14: 5 mg via INTRAVENOUS

## 2012-11-14 MED ORDER — OLMESARTAN MEDOXOMIL-HCTZ 40-12.5 MG PO TABS: 1.0000 | ORAL_TABLET | Freq: Every day | ORAL | Status: DC

## 2012-11-14 MED ORDER — AMLODIPINE BESYLATE 2.5 MG PO TABS
2.5000 mg | ORAL_TABLET | Freq: Every day | ORAL | Status: DC
  Administered 2012-11-15: 2.5 mg via ORAL
  Filled 2012-11-14: qty 1

## 2012-11-14 MED ORDER — SODIUM CHLORIDE 0.9 % IV SOLN
INTRAVENOUS | Status: DC
  Administered 2012-11-14 – 2012-11-15 (×2): via INTRAVENOUS

## 2012-11-14 MED ORDER — ONDANSETRON HCL 4 MG/2ML IJ SOLN
INTRAMUSCULAR | Status: DC | PRN
  Administered 2012-11-14: 4 mg via INTRAVENOUS

## 2012-11-14 MED ORDER — DOCUSATE SODIUM 100 MG PO CAPS
100.0000 mg | ORAL_CAPSULE | Freq: Two times a day (BID) | ORAL | Status: DC
  Administered 2012-11-14 – 2012-11-15 (×2): 100 mg via ORAL

## 2012-11-14 MED ORDER — TRAMADOL HCL 50 MG PO TABS: 50.0000 mg | ORAL_TABLET | Freq: Four times a day (QID) | ORAL | Status: DC | PRN

## 2012-11-14 MED ORDER — DEXAMETHASONE SODIUM PHOSPHATE 10 MG/ML IJ SOLN
10.0000 mg | Freq: Once | INTRAMUSCULAR | Status: AC
  Administered 2012-11-14: 10 mg via INTRAVENOUS

## 2012-11-14 MED ORDER — METHOCARBAMOL 100 MG/ML IJ SOLN
500.0000 mg | Freq: Four times a day (QID) | INTRAMUSCULAR | Status: DC | PRN
  Administered 2012-11-14: 500 mg via INTRAVENOUS
  Filled 2012-11-14: qty 5

## 2012-11-14 MED ORDER — FERROUS SULFATE 325 (65 FE) MG PO TABS
325.0000 mg | ORAL_TABLET | Freq: Three times a day (TID) | ORAL | Status: DC
  Administered 2012-11-14 – 2012-11-15 (×3): 325 mg via ORAL
  Filled 2012-11-14 (×6): qty 1

## 2012-11-14 MED ORDER — SODIUM CHLORIDE 0.9 % IV SOLN: INTRAVENOUS | Status: DC

## 2012-11-14 MED ORDER — ZOLPIDEM TARTRATE 5 MG PO TABS
5.0000 mg | ORAL_TABLET | Freq: Every evening | ORAL | Status: DC | PRN
  Administered 2012-11-14: 5 mg via ORAL
  Filled 2012-11-14: qty 1

## 2012-11-14 MED ORDER — CELECOXIB 200 MG PO CAPS
200.0000 mg | ORAL_CAPSULE | Freq: Two times a day (BID) | ORAL | Status: DC
  Administered 2012-11-14 – 2012-11-15 (×3): 200 mg via ORAL
  Filled 2012-11-14 (×4): qty 1

## 2012-11-14 MED ORDER — ACETAMINOPHEN 325 MG PO TABS: 650.0000 mg | ORAL_TABLET | Freq: Four times a day (QID) | ORAL | Status: DC | PRN

## 2012-11-14 MED ORDER — CEFAZOLIN SODIUM-DEXTROSE 2-3 GM-% IV SOLR
2.0000 g | INTRAVENOUS | Status: AC
  Administered 2012-11-14: 2 g via INTRAVENOUS

## 2012-11-14 MED ORDER — MEPERIDINE HCL 50 MG/ML IJ SOLN: 6.2500 mg | INTRAMUSCULAR | Status: DC | PRN

## 2012-11-14 MED ORDER — DIPHENHYDRAMINE HCL 12.5 MG/5ML PO ELIX: 25.0000 mg | ORAL_SOLUTION | Freq: Four times a day (QID) | ORAL | Status: DC | PRN

## 2012-11-14 MED ORDER — LACTATED RINGERS IV SOLN: INTRAVENOUS | Status: DC

## 2012-11-14 MED ORDER — FENTANYL CITRATE 0.05 MG/ML IJ SOLN
INTRAMUSCULAR | Status: DC | PRN
  Administered 2012-11-14: 100 ug via INTRAVENOUS

## 2012-11-14 MED ORDER — PHENOL 1.4 % MT LIQD
1.0000 | OROMUCOSAL | Status: DC | PRN
  Filled 2012-11-14: qty 177

## 2012-11-14 MED ORDER — MENTHOL 3 MG MT LOZG
1.0000 | LOZENGE | OROMUCOSAL | Status: DC | PRN
  Filled 2012-11-14: qty 9

## 2012-11-14 MED ORDER — PROPOFOL 10 MG/ML IV EMUL
INTRAVENOUS | Status: DC | PRN
  Administered 2012-11-14: 75 ug/kg/min via INTRAVENOUS

## 2012-11-14 MED ORDER — ACETAMINOPHEN 10 MG/ML IV SOLN
INTRAVENOUS | Status: DC | PRN
  Administered 2012-11-14: 1000 mg via INTRAVENOUS

## 2012-11-14 MED ORDER — POLYETHYLENE GLYCOL 3350 17 G PO PACK
17.0000 g | PACK | Freq: Every day | ORAL | Status: DC
  Administered 2012-11-14: 17 g via ORAL

## 2012-11-14 MED ORDER — TRAMADOL HCL 50 MG PO TABS
50.0000 mg | ORAL_TABLET | Freq: Four times a day (QID) | ORAL | Status: DC | PRN
  Administered 2012-11-14: 100 mg via ORAL
  Administered 2012-11-14 (×2): 50 mg via ORAL
  Administered 2012-11-15: 100 mg via ORAL
  Filled 2012-11-14 (×2): qty 1
  Filled 2012-11-14 (×2): qty 2

## 2012-11-14 MED ORDER — PROMETHAZINE HCL 25 MG/ML IJ SOLN: 6.2500 mg | INTRAMUSCULAR | Status: DC | PRN

## 2012-11-14 MED ORDER — MIDAZOLAM HCL 5 MG/5ML IJ SOLN
INTRAMUSCULAR | Status: DC | PRN
  Administered 2012-11-14: 2 mg via INTRAVENOUS

## 2012-11-14 MED ORDER — BUPIVACAINE IN DEXTROSE 0.75-8.25 % IT SOLN
INTRATHECAL | Status: DC | PRN
  Administered 2012-11-14: 1.6 mL via INTRATHECAL

## 2012-11-14 MED ORDER — ALUMINUM HYDROXIDE GEL 320 MG/5ML PO SUSP
15.0000 mL | ORAL | Status: DC | PRN
  Filled 2012-11-14: qty 30

## 2012-11-14 MED ORDER — METHOCARBAMOL 500 MG PO TABS
500.0000 mg | ORAL_TABLET | Freq: Four times a day (QID) | ORAL | Status: DC | PRN
  Administered 2012-11-14 – 2012-11-15 (×2): 500 mg via ORAL
  Filled 2012-11-14 (×2): qty 1

## 2012-11-14 MED ORDER — IRBESARTAN 300 MG PO TABS
300.0000 mg | ORAL_TABLET | Freq: Every day | ORAL | Status: DC
  Administered 2012-11-14 – 2012-11-15 (×2): 300 mg via ORAL
  Filled 2012-11-14 (×2): qty 1

## 2012-11-14 MED ORDER — LACTATED RINGERS IV SOLN
INTRAVENOUS | Status: DC | PRN
  Administered 2012-11-14 (×3): via INTRAVENOUS

## 2012-11-14 MED ORDER — HYDROMORPHONE HCL PF 1 MG/ML IJ SOLN: 0.5000 mg | INTRAMUSCULAR | Status: DC | PRN

## 2012-11-14 MED ORDER — CEFAZOLIN SODIUM 1-5 GM-% IV SOLN
1.0000 g | Freq: Four times a day (QID) | INTRAVENOUS | Status: AC
  Administered 2012-11-14 (×2): 1 g via INTRAVENOUS
  Filled 2012-11-14 (×2): qty 50

## 2012-11-14 MED ORDER — 0.9 % SODIUM CHLORIDE (POUR BTL) OPTIME
TOPICAL | Status: DC | PRN
  Administered 2012-11-14: 1000 mL

## 2012-11-14 SURGICAL SUPPLY — 48 items
BAG ZIPLOCK 12X15 (MISCELLANEOUS) ×2 IMPLANT
BANDAGE ELASTIC 6 VELCRO ST LF (GAUZE/BANDAGES/DRESSINGS) ×2 IMPLANT
BANDAGE ESMARK 6X9 LF (GAUZE/BANDAGES/DRESSINGS) ×1 IMPLANT
BLADE SAW RECIPROCATING 77.5 (BLADE) ×2 IMPLANT
BLADE SAW SGTL 13.0X1.19X90.0M (BLADE) ×2 IMPLANT
BNDG ESMARK 6X9 LF (GAUZE/BANDAGES/DRESSINGS) ×2
BOWL SMART MIX CTS (DISPOSABLE) ×2 IMPLANT
CEMENT HV SMART SET (Cement) ×2 IMPLANT
CLOTH BEACON ORANGE TIMEOUT ST (SAFETY) ×2 IMPLANT
COVER SURGICAL LIGHT HANDLE (MISCELLANEOUS) ×2 IMPLANT
CUFF TOURN SGL QUICK 34 (TOURNIQUET CUFF) ×1
CUFF TRNQT CYL 34X4X40X1 (TOURNIQUET CUFF) ×1 IMPLANT
DERMABOND ADVANCED (GAUZE/BANDAGES/DRESSINGS) ×1
DERMABOND ADVANCED .7 DNX12 (GAUZE/BANDAGES/DRESSINGS) ×1 IMPLANT
DRAPE EXTREMITY T 121X128X90 (DRAPE) ×2 IMPLANT
DRAPE POUCH INSTRU U-SHP 10X18 (DRAPES) ×2 IMPLANT
DRSG AQUACEL AG ADV 3.5X 6 (GAUZE/BANDAGES/DRESSINGS) ×2 IMPLANT
DRSG TEGADERM 4X4.75 (GAUZE/BANDAGES/DRESSINGS) ×2 IMPLANT
DURAPREP 26ML APPLICATOR (WOUND CARE) ×2 IMPLANT
ELECT REM PT RETURN 9FT ADLT (ELECTROSURGICAL) ×2
ELECTRODE REM PT RTRN 9FT ADLT (ELECTROSURGICAL) ×1 IMPLANT
EVACUATOR 1/8 PVC DRAIN (DRAIN) ×2 IMPLANT
FACESHIELD LNG OPTICON STERILE (SAFETY) ×8 IMPLANT
GAUZE SPONGE 2X2 8PLY STRL LF (GAUZE/BANDAGES/DRESSINGS) ×1 IMPLANT
GLOVE BIOGEL PI IND STRL 7.5 (GLOVE) ×1 IMPLANT
GLOVE BIOGEL PI IND STRL 8 (GLOVE) ×1 IMPLANT
GLOVE BIOGEL PI INDICATOR 7.5 (GLOVE) ×1
GLOVE BIOGEL PI INDICATOR 8 (GLOVE) ×1
GLOVE ECLIPSE 8.0 STRL XLNG CF (GLOVE) ×2 IMPLANT
GLOVE ORTHO TXT STRL SZ7.5 (GLOVE) ×4 IMPLANT
GOWN BRE IMP PREV XXLGXLNG (GOWN DISPOSABLE) ×4 IMPLANT
GOWN STRL NON-REIN LRG LVL3 (GOWN DISPOSABLE) ×2 IMPLANT
KIT BASIN OR (CUSTOM PROCEDURE TRAY) ×2 IMPLANT
LEGGING LITHOTOMY PAIR STRL (DRAPES) ×2 IMPLANT
MANIFOLD NEPTUNE II (INSTRUMENTS) ×2 IMPLANT
NDL SAFETY ECLIPSE 18X1.5 (NEEDLE) ×1 IMPLANT
NEEDLE HYPO 18GX1.5 SHARP (NEEDLE) ×1
PACK TOTAL JOINT (CUSTOM PROCEDURE TRAY) ×2 IMPLANT
POSITIONER SURGICAL ARM (MISCELLANEOUS) ×2 IMPLANT
SPONGE GAUZE 2X2 STER 10/PKG (GAUZE/BANDAGES/DRESSINGS) ×1
SUCTION FRAZIER TIP 10 FR DISP (SUCTIONS) ×2 IMPLANT
SUT MNCRL AB 4-0 PS2 18 (SUTURE) ×2 IMPLANT
SUT VIC AB 1 CT1 36 (SUTURE) ×2 IMPLANT
SUT VIC AB 2-0 CT1 27 (SUTURE) ×2
SUT VIC AB 2-0 CT1 TAPERPNT 27 (SUTURE) ×2 IMPLANT
SYR 50ML LL SCALE MARK (SYRINGE) ×2 IMPLANT
TOWEL OR 17X26 10 PK STRL BLUE (TOWEL DISPOSABLE) ×4 IMPLANT
TRAY FOLEY CATH 14FRSI W/METER (CATHETERS) ×2 IMPLANT

## 2012-11-14 NOTE — Progress Notes (Signed)
Utilization review completed.  

## 2012-11-14 NOTE — Transfer of Care (Signed)
Immediate Anesthesia Transfer of Care Note  Patient: Stephanie Baker  Procedure(s) Performed: Procedure(s) (LRB) with comments: UNICOMPARTMENTAL KNEE (Left)  Patient Location: PACU  Anesthesia Type:Regional  Level of Consciousness: awake, alert  and oriented  Airway & Oxygen Therapy: Patient Spontanous Breathing and Patient connected to face mask oxygen  Post-op Assessment: Report given to PACU RN and Post -op Vital signs reviewed and stable  Post vital signs: Reviewed and stable  Complications: No apparent anesthesia complications

## 2012-11-14 NOTE — Op Note (Signed)
NAME: ARIANNE KLINGE    MEDICAL RECORD NO.: [id number]   FACILITY: [facility]   DATE OF BIRTH: February 23, 1941  PHYSICIAN: Madlyn Frankel. Charlann Boxer, M.D.    DATE OF PROCEDURE: 11/14/2012    OPERATIVE REPORT   PREOPERATIVE DIAGNOSIS: Left knee medial compartment osteoarthritis.   POSTOPERATIVE DIAGNOSIS: Left knee medial compartment osteoarthritis.  PROCEDURE: Left medial partial knee replacement utilizing Biomet Oxford knee  component, size small femur, a left medial size A tibial tray with a size 4 insert.   SURGEON: Madlyn Frankel. Charlann Boxer, M.D.   ASSISTANT: Leilani Able, PAC.   Please note that Mr. Chabon was present for the entirety of the case,  utilized for preoperative positioning, perioperative retractor  management, general facilitation of the case and primary wound closure.   ANESTHESIA: Spinal.   SPECIMENS: None.   COMPLICATIONS: None.  DRAINS: 1 medium HV   TOURNIQUET TIME: 40 minutes at 250 mmHg.   INDICATIONS FOR PROCEDURE: Mrs Pazos is a 82 patient of mine with history of right partial knee that has done very well.  She now presents for evaluation of left knee pain.  She presented with primary complaints of pain on the medial side of their knee. Radiographs revealed advanced medial compartment arthritis with specifically an antero-medial wear pattern.  There was bone on bone changes noted with subchondral sclerosis and osteophytes present. The patient has had progressive problems failing to respond to conservative measures of medications, injections and activity modification. Risks of infection, DVT, component failure, need for future revision surgery were all discussed and reviewed.  Consent was obtained for benefit of pain relief.   PROCEDURE IN DETAIL: The patient was brought to the operative theater.  Once adequate anesthesia, preoperative antibiotics, 2gm Ancef administered, the patient was positioned in supine position with a left thigh tourniquet  placed. The left lower  extremity was prepped and draped in sterile  fashion with the leg on the Oxford leg holder.  The leg was allowed to flex to 120 degrees. A time-out  was performed identifying the patient, planned procedure, and extremity.  The leg was exsanguinated, tourniquet elevated to 250 mmHg. A midline  incision was made from the proximal pole of the patella to the tibial tubercle. A  soft tissue plane was created and partial median arthrotomy was then  made to allow for subluxation of the patella. Following initial synovectomy and  debridement, the osteophytes were removed off the medial aspect of the  knee.   Attention was first directed to the tibia. The tibial  extramedullary guide was positioned over the anterior crest of the tibia  and pinned into position, and using a measured resection guide from the  Oxford system, a 4 mm resection was made off the proximal tibia. First  the reciprocating saw along the medial aspect of the tibial spines, then the oscillating saw.    At this point, I sized this cut surface seem to be best fit for a size small tibial tray.  With the retractors out of the wound and the knee held at 90 degrees the 4 feeler gauge had appropriate tension on the medial ligament.   At this point, the femoral canal was opened with a drill and the  intramedullary rod passed. Then using the guide for a small femoral resection off  the posterior aspect of the femur was positioned over the mid portion of the medial femoral condyle.  The orientation was set using the guide that mates the femoral guide to the intramedullary rod.  The 2 drill holes were made into the distal femur.  The posterior guide was then impacted into place and the posterior  femoral cut made.  At this point, I milled the distal femur with a size 4 spigot in place. At this point, we did a trial reduction of the small femur, size A tibial tray and a 4 feeler gauge. At 90 degrees of  flexion and at 20 degrees of flexion  the knee had symmetric tension on  the ligaments.   Given these findings, the trial femoral component was removed. Final preparation of tibia was carried out by pinning it in position. Then  using a reciprocating saw I removed bone for the keel. Further bone was  removed with an osteotome.  Trial reduction was now carried out with the small femur, the size A keeled tibia, and a 4 lollipop insert. The balance of the  ligaments appeared to be symmetric at 20 degrees and 90 degrees. Given  all these findings, the trial components were removed.   Cement was mixed. The final components were opened. The knee was irrigated with  normal saline solution. Then final debridements of the  soft tissue was carried out, I also drilled the sclerotic bone with a drill.  The final components were cemented with a single batch of cement in a  two-stage technique with the tibial component cemented first. The knee  was then brought  to 45 degrees of flexion with a 4 feeler gauge, held with pressure for a minute and half.  After this the femoral component was cemented in place.  The knee was again held at 45 degrees of flexion while the cement fully cured.  Excess cement was removed throughout the knee. Tourniquet was let down  after 40 minutes. After the cement had fully cured and excessive cement  was removed throughout the knee there was no visualized cement present.   The final size small left 4 insert was chosen and snapped into position. We re-irrigated  the knee. I placed a medium Hemovac drain deep. The extensor mechanism  was then reapproximated using a #1 Vicryl with the knee in flexion. The  remaining wound was closed with 2-0 Vicryl and a running 4-0 Monocryl.  The knee was cleaned, dried, and dressed sterilely using Dermabond and  Aquacel dressing. The drain site was dressed separately. The patient  was brought to the recovery room, Ace wrap in place, tolerating the  procedure well. He will be in  the hospital for overnight observation.  We will initiate physical therapy and progress to ambulate.     Madlyn Frankel Charlann Boxer, M.D.

## 2012-11-14 NOTE — Anesthesia Preprocedure Evaluation (Addendum)
Anesthesia Evaluation  Patient identified by MRN, date of birth, ID band Patient awake    Reviewed: Allergy & Precautions, H&P , NPO status , Patient's Chart, lab work & pertinent test results  Airway Mallampati: II TM Distance: >3 FB Neck ROM: Full    Dental No notable dental hx.    Pulmonary neg pulmonary ROS,  breath sounds clear to auscultation  Pulmonary exam normal       Cardiovascular hypertension, Pt. on medications negative cardio ROS  Rhythm:Regular Rate:Normal     Neuro/Psych negative neurological ROS  negative psych ROS   GI/Hepatic negative GI ROS, Neg liver ROS,   Endo/Other  negative endocrine ROS  Renal/GU negative Renal ROS  negative genitourinary   Musculoskeletal negative musculoskeletal ROS (+)   Abdominal   Peds negative pediatric ROS (+)  Hematology negative hematology ROS (+)   Anesthesia Other Findings   Reproductive/Obstetrics negative OB ROS                          Anesthesia Physical Anesthesia Plan  ASA: II  Anesthesia Plan: Spinal   Post-op Pain Management:    Induction:   Airway Management Planned: Simple Face Mask  Additional Equipment:   Intra-op Plan:   Post-operative Plan:   Informed Consent: I have reviewed the patients History and Physical, chart, labs and discussed the procedure including the risks, benefits and alternatives for the proposed anesthesia with the patient or authorized representative who has indicated his/her understanding and acceptance.   Dental advisory given  Plan Discussed with: CRNA  Anesthesia Plan Comments:         Anesthesia Quick Evaluation  

## 2012-11-14 NOTE — Interval H&P Note (Signed)
History and Physical Interval Note:  11/14/2012 7:24 AM  Stephanie Baker  has presented today for surgery, with the diagnosis of Left Medial Ostoarthitis of Knee  The various methods of treatment have been discussed with the patient and family. After consideration of risks, benefits and other options for treatment, the patient has consented to  Procedure(s) (LRB) with comments: UNICOMPARTMENTAL KNEE (Left) as a surgical intervention .  The patient's history has been reviewed, patient examined, no change in status, stable for surgery.  I have reviewed the patient's chart and labs.  Questions were answered to the patient's satisfaction.     Shelda Pal

## 2012-11-14 NOTE — Evaluation (Signed)
Physical Therapy Evaluation Patient Details Name: Stephanie Baker MRN: [id number] DOB: Jan 16, 1941 Today's Date: 11/14/2012 Time: 4098-1191 PT Time Calculation (min): 14 min  PT Assessment / Plan / Recommendation Clinical Impression  71 yo female s/p L uni-compartmental knee replacement-POD 0. Mobilizing fairly well-limited by mild-moderate lightheadedness. Anticipate pt will progress well during stay. Recommend HHPT to improve strength, activity tolerance and maximize independence with functional mobility.     PT Assessment  Patient needs continued PT services    Follow Up Recommendations  Home health PT    Does the patient have the potential to tolerate intense rehabilitation      Barriers to Discharge        Equipment Recommendations  None recommended by PT    Recommendations for Other Services     Frequency 7X/week    Precautions / Restrictions Precautions Precautions: Knee Required Braces or Orthoses: Knee Immobilizer - Left Knee Immobilizer - Left: Discontinue once straight leg raise with < 10 degree lag Restrictions Weight Bearing Restrictions: No LLE Weight Bearing: Weight bearing as tolerated   Pertinent Vitals/Pain 4/10 L knee      Mobility  Bed Mobility Bed Mobility: Supine to Sit Supine to Sit: 4: Min assist Details for Bed Mobility Assistance: Assist for L LE off bed. VCs safety, technique.  Transfers Transfers: Sit to Stand;Stand to Sit Sit to Stand: 4: Min assist;From bed Stand to Sit: 4: Min assist;To bed Details for Transfer Assistance: VCs safety, technique, hand placement. Assist to rise, stabilize, control descent.  Ambulation/Gait Ambulation/Gait Assistance: 4: Min assist Ambulation Distance (Feet): 55 Feet Assistive device: Rolling walker Ambulation/Gait Assistance Details: assist to stabilize. VCs safety, sequence, step length. Pt c/o mild-moderate lightheadedness while ambulating.  Gait Pattern: Step-to pattern;Decreased stride  length;Decreased step length - right;Decreased step length - left;Antalgic    Shoulder Instructions     Exercises     PT Diagnosis: Difficulty walking;Abnormality of gait;Acute pain  PT Problem List: Decreased strength;Decreased activity tolerance;Decreased range of motion;Decreased mobility;Pain PT Treatment Interventions: DME instruction;Gait training;Stair training;Functional mobility training;Therapeutic activities;Therapeutic exercise;Patient/family education   PT Goals Acute Rehab PT Goals PT Goal Formulation: With patient Time For Goal Achievement: 11/21/12 Potential to Achieve Goals: Good Pt will go Supine/Side to Sit: with supervision PT Goal: Supine/Side to Sit - Progress: Goal set today Pt will go Sit to Supine/Side: with supervision PT Goal: Sit to Supine/Side - Progress: Goal set today Pt will go Sit to Stand: with supervision PT Goal: Sit to Stand - Progress: Goal set today Pt will Ambulate: 51 - 150 feet;with supervision;with least restrictive assistive device PT Goal: Ambulate - Progress: Goal set today Pt will Go Up / Down Stairs: 3-5 stairs;with supervision;with rail(s);with least restrictive assistive device PT Goal: Up/Down Stairs - Progress: Goal set today  Visit Information  Last PT Received On: 11/14/12 Assistance Needed: +1    Subjective Data  Subjective: "I'm ready" Patient Stated Goal: Home   Prior Functioning  Home Living Lives With: Spouse Available Help at Discharge: Family Type of Home:  (condo) Home Access: Stairs to enter Secretary/administrator of Steps: 3+1 Entrance Stairs-Rails: Right;Left Home Layout: Two level;Able to live on main level with bedroom/bathroom Home Adaptive Equipment: Walker - rolling;Shower chair with back;Crutches;Straight cane;Bedside commode/3-in-1 Prior Function Level of Independence: Independent Able to Take Stairs?: Yes Driving: Yes Vocation: Retired Musician: No difficulties     Cognition  Overall Cognitive Status: Appears within functional limits for tasks assessed/performed Arousal/Alertness: Awake/alert Orientation Level: Appears intact for tasks assessed Behavior  During Session: [facility] for tasks performed    Extremity/Trunk Assessment Right Lower Extremity Assessment RLE ROM/Strength/Tone: [facility] for tasks assessed Left Lower Extremity Assessment LLE ROM/Strength/Tone: Deficits LLE ROM/Strength/Tone Deficits: SLR 3-/5, moves ankle well.  Trunk Assessment Trunk Assessment: Normal   Balance    End of Session PT - End of Session Equipment Utilized During Treatment: Gait belt Activity Tolerance: Patient tolerated treatment well Patient left: in chair;with call bell/phone within reach;with family/visitor present  GP     Rebeca Alert [facility] 11/14/2012, 4:29 PM [id number]

## 2012-11-14 NOTE — Plan of Care (Signed)
Problem: Consults Goal: Diagnosis- Total Joint Replacement Partial/Uniknee     

## 2012-11-14 NOTE — Progress Notes (Signed)
Orthopedic Tech Progress Note Patient Details:  Stephanie Baker [birth year] [id number]  Patient ID: [name], female   DOB: 1941-03-01, 71 y.o.   MRN: [id number]   [name] 11/14/2012, 11:10 AM LEFT KNEE IMMOBILIZER

## 2012-11-14 NOTE — Anesthesia Procedure Notes (Signed)
Spinal  Patient location during procedure: OR End time: 11/14/2012 7:40 AM Staffing CRNA/Resident: Enriqueta Shutter Performed by: anesthesiologist and resident/CRNA  Preanesthetic Checklist Completed: patient identified, site marked, surgical consent, pre-op evaluation, timeout performed, IV checked, risks and benefits discussed and monitors and equipment checked Spinal Block Patient position: sitting Prep: Betadine Patient monitoring: heart rate, continuous pulse ox and blood pressure Approach: midline Location: L3-4 Injection technique: single-shot Needle Needle type: Sprotte  Needle gauge: 24 G Needle length: 9 cm Assessment Sensory level: T6 Additional Notes Expiration date of kit checked and confirmed. Patient tolerated procedure well, without complications.

## 2012-11-14 NOTE — Anesthesia Postprocedure Evaluation (Signed)
  Anesthesia Post-op Note  Patient: Stephanie Baker  Procedure(s) Performed: Procedure(s) (LRB): UNICOMPARTMENTAL KNEE (Left)  Patient Location: PACU  Anesthesia Type: Spinal  Level of Consciousness: awake and alert   Airway and Oxygen Therapy: Patient Spontanous Breathing  Post-op Pain: mild  Post-op Assessment: Post-op Vital signs reviewed, Patient's Cardiovascular Status Stable, Respiratory Function Stable, Patent Airway and No signs of Nausea or vomiting  Post-op Vital Signs: stable  Complications: No apparent anesthesia complications

## 2012-11-15 DIAGNOSIS — Z96652 Presence of left artificial knee joint: Secondary | ICD-10-CM

## 2012-11-15 DIAGNOSIS — E663 Overweight: Secondary | ICD-10-CM

## 2012-11-15 DIAGNOSIS — D500 Iron deficiency anemia secondary to blood loss (chronic): Secondary | ICD-10-CM

## 2012-11-15 LAB — BASIC METABOLIC PANEL
BUN: 18 mg/dL (ref 6–23)
GFR calc Af Amer: >90 mL/min (ref >90)
GFR calc non Af Amer: >90 mL/min (ref >90)
Potassium: 3.7 mEq/L (ref 3.5–5.1)
Sodium: 136 mEq/L (ref 135–145)

## 2012-11-15 LAB — CBC
MCHC: 34.5 g/dL (ref 30.0–36.0)
Platelets: 264 10*3/uL (ref 150–400)
RDW: 13 % (ref 11.5–15.5)

## 2012-11-15 MED ORDER — FERROUS SULFATE 325 (65 FE) MG PO TABS: 325.0000 mg | ORAL_TABLET | Freq: Three times a day (TID) | ORAL | Status: DC

## 2012-11-15 MED ORDER — POLYETHYLENE GLYCOL 3350 17 G PO PACK: 17.0000 g | PACK | Freq: Every day | ORAL | Status: DC

## 2012-11-15 MED ORDER — CELECOXIB 200 MG PO CAPS: 200.0000 mg | ORAL_CAPSULE | Freq: Two times a day (BID) | ORAL | Status: DC

## 2012-11-15 MED ORDER — ASPIRIN EC 325 MG PO TBEC: 325.0000 mg | DELAYED_RELEASE_TABLET | Freq: Two times a day (BID) | ORAL | Status: DC

## 2012-11-15 MED ORDER — METHOCARBAMOL 500 MG PO TABS: 500.0000 mg | ORAL_TABLET | Freq: Four times a day (QID) | ORAL | Status: DC | PRN

## 2012-11-15 MED ORDER — TRAMADOL HCL 50 MG PO TABS: 50.0000 mg | ORAL_TABLET | Freq: Four times a day (QID) | ORAL | Status: DC | PRN

## 2012-11-15 MED ORDER — DSS 100 MG PO CAPS: 100.0000 mg | ORAL_CAPSULE | Freq: Two times a day (BID) | ORAL | Status: DC

## 2012-11-15 NOTE — Discharge Summary (Signed)
Physician Discharge Summary  Patient ID: Stephanie Baker MRN: [id number] DOB/AGE: [birth year] 71 y.o.  Admit date: 11/14/2012 Discharge date: 11/15/2012   Procedures:  Procedure(s) (LRB): UNICOMPARTMENTAL KNEE (Left)  Attending Physician:  Dr. Durene Romans   Admission Diagnoses:   Left knee medial compartment OA / pain  Discharge Diagnoses:  Principal Problem:  *S/P left UKR Active Problems:  Expected blood loss anemia  Overweight (BMI 25.0-29.9) HTN GERD  HPI: Stephanie Baker, 71 y.o. female, has a history of pain and functional disability in the left knee due to arthritis and has failed non-surgical conservative treatments for greater than 12 weeks to includeNSAID's and/or analgesics and activity modification. Onset of symptoms was gradual, starting 3 years ago with gradually worsening course since that time. The patient noted no past surgery on the left knee(s). Patient currently rates pain in the left knee(s) at 7 out of 10 with activity. Patient has worsening of pain with activity and weight bearing, pain that interferes with activities of daily living, crepitus and joint swelling. Patient has evidence of periarticular osteophytes and joint space narrowing by imaging studies. Risks, benefits and expectations were discussed with the patient. Patient understand the risks, benefits and expectations and wishes to proceed with surgery.  PCP: Garlan Fillers, MD   Discharged Condition: good  Hospital Course:  Patient underwent the above stated procedure on 11/14/2012. Patient tolerated the procedure well and brought to the recovery room in good condition and subsequently to the floor.  POD #1 BP: 108/61 ; Pulse: 56 ; Temp: 97.3 F (36.3 C) ; Resp: 16  Pt's foley was removed, as well as the hemovac drain removed. IV was changed to a saline lock. Patient reports pain as mild, pain well controlled. No events throughout the night. Ready to be discharged home. Neurovascular  intact, dorsiflexion/plantar flexion intact, incision: dressing C/D/I, no cellulitis present and compartment soft.   LABS  Basename  11/15/12 0425   HGB  11.9  HCT  34.5    Discharge Exam: General appearance: alert, cooperative and no distress Extremities: Homans sign is negative, no sign of DVT, no edema, redness or tenderness in the calves or thighs and no ulcers, gangrene or trophic changes  Disposition: Home or Self Care with follow up in 2 weeks   Follow-up Information    Follow up with Shelda Pal, MD. In 2 weeks.   Contact information:   [address] [place] [address] [phone]          Discharge Orders    Future Orders Please Complete By Expires   Diet - low sodium heart healthy      Call MD / Call 911      Comments:   If you experience chest pain or shortness of breath, CALL 911 and be transported to the hospital emergency room.  If you develope a fever above 101 F, pus (white drainage) or increased drainage or redness at the wound, or calf pain, call your surgeon's office.   Discharge instructions      Comments:   Maintain surgical dressing for 10-14 days, then replace with gauze and tape. Keep the area dry and clean until follow up. Follow up in 2 weeks at [facility]. Call with any questions or concerns.   Constipation Prevention      Comments:   Drink plenty of fluids.  Prune juice may be helpful.  You may use a stool softener, such as Colace (over the counter) 100 mg twice a  day.  Use MiraLax (over the counter) for constipation as needed.   Increase activity slowly as tolerated      TED hose      Comments:   Use stockings (TED hose) for 2 weeks on both leg(s).  You may remove them at night for sleeping.   Change dressing      Comments:   Maintain surgical dressing for 10-14 days, then change the dressing daily with sterile 4 x 4 inch gauze dressing and tape. Keep the area dry and clean.      Discharge Medication List as  of 11/15/2012 12:25 PM    START taking these medications   Details  celecoxib (CELEBREX) 200 MG capsule Take 1 capsule (200 mg total) by mouth every 12 (twelve) hours., Starting 11/15/2012, Until Discontinued, No Print    docusate sodium 100 MG CAPS Take 100 mg by mouth 2 (two) times daily., Starting 11/15/2012, Until Discontinued, No Print    ferrous sulfate 325 (65 FE) MG tablet Take 1 tablet (325 mg total) by mouth 3 (three) times daily after meals., Starting 11/15/2012, Until Discontinued, No Print    methocarbamol (ROBAXIN) 500 MG tablet Take 1 tablet (500 mg total) by mouth every 6 (six) hours as needed (muscle spasms)., Starting 11/15/2012, Until Discontinued, No Print    polyethylene glycol (MIRALAX / GLYCOLAX) packet Take 17 g by mouth daily., Starting 11/15/2012, Until Discontinued, No Print    traMADol (ULTRAM) 50 MG tablet Take 1-2 tablets (50-100 mg total) by mouth every 6 (six) hours as needed., Starting 11/15/2012, Until Discontinued, Print      CONTINUE these medications which have CHANGED   Details  aspirin EC 325 MG tablet Take 1 tablet (325 mg total) by mouth 2 (two) times daily. X 4 weeks, Starting 11/15/2012, Until Discontinued, No Print      CONTINUE these medications which have NOT CHANGED   Details  amLODipine (NORVASC) 2.5 MG tablet Take 2.5 mg by mouth daily before breakfast., Until Discontinued, Historical Med    Biotin 5000 MCG CAPS Take 1 capsule by mouth daily., Until Discontinued, Historical Med    CALCIUM & MAGNESIUM CARBONATES PO Take 1 tablet by mouth at bedtime., Until Discontinued, Historical Med    Cholecalciferol (VITAMIN D) 2000 UNITS CAPS Take 1 capsule by mouth at bedtime., Until Discontinued, Historical Med    Evening Primrose Oil CAPS Take 1 capsule by mouth 2 (two) times daily., Until Discontinued, Historical Med    glucosamine-chondroitin 500-400 MG tablet Take 1-2 tablets by mouth 2 (two) times daily. Takes 2 in the morning and 1 at  night, Until Discontinued, Historical Med    Misc Natural Products (TART CHERRY ADVANCED PO) Take 1 tablet by mouth daily., Until Discontinued, Historical Med    olmesartan-hydrochlorothiazide (BENICAR HCT) 40-12.5 MG per tablet Take 1 tablet by mouth daily before breakfast., Until Discontinued, Historical Med    potassium chloride SA (K-DUR,KLOR-CON) 20 MEQ tablet Take 20 mEq by mouth daily before breakfast., Until Discontinued, Historical Med    Vitamin D, Ergocalciferol, (DRISDOL) 50000 UNITS CAPS Take 50,000 Units by mouth every 30 (thirty) days. On the first of the month, Until Discontinued, Historical Med    fish oil-omega-3 fatty acids 1000 MG capsule Take 1 g by mouth 3 (three) times daily., Until Discontinued, Historical Med    Multiple Vitamins-Minerals (ULTRA VITA TIME IRON FREE PO) Take 1 tablet by mouth daily., Until Discontinued, Historical Med         Signed: Anastasio Auerbach. Herma Uballe  PAC  11/15/2012, 6:48 PM

## 2012-11-15 NOTE — Evaluation (Signed)
Occupational Therapy Evaluation Patient Details Name: Stephanie Baker MRN: [id number] DOB: January 23, 1941 Today's Date: 11/15/2012 Time: 4098-1191 OT Time Calculation (min): 26 min  OT Assessment / Plan / Recommendation Clinical Impression  Pt doing well POD 1 LTKR. All education completed. Pt will have necessary level of A from family upon d/c. Pt has all necessary DME.    OT Assessment  Patient does not need any further OT services    Follow Up Recommendations  No OT follow up    Barriers to Discharge      Equipment Recommendations  None recommended by OT    Recommendations for Other Services    Frequency       Precautions / Restrictions Precautions Precautions: Knee Required Braces or Orthoses: Knee Immobilizer - Left Knee Immobilizer - Left: Discontinue once straight leg raise with < 10 degree lag Restrictions Weight Bearing Restrictions: No LLE Weight Bearing: Weight bearing as tolerated   Pertinent Vitals/Pain Pt initially c/o dizziness-BP 132/75. C/o mild pain. Repositioned and cold applied.    ADL  Grooming: Performed;Teeth care;Supervision/safety Where Assessed - Grooming: Supported standing Toilet Transfer: Research scientist (life sciences) Method: Sit to Barista: Raised toilet seat with arms (or 3-in-1 over toilet) Toileting - Clothing Manipulation and Hygiene: Performed;Supervision/safety Where Assessed - Engineer, mining and Hygiene: Sit to stand from 3-in-1 or toilet Tub/Shower Transfer: Performed;Supervision/safety Tub/Shower Transfer Method: Science writer: Walk in Scientist, research (physical sciences) Used: Rolling walker;Gait belt Transfers/Ambulation Related to ADLs: Pt ambulated to the bathroom with supervision. ADL Comments: Pt educated on commode transfer, shower transfer with good return demo. Pt stated husband would assist with LB ADLs.    OT Diagnosis:    OT Problem List:   OT Treatment  Interventions:     OT Goals    Visit Information  Last OT Received On: 11/15/12 Assistance Needed: +1    Subjective Data  Subjective: I'm leaving today. Patient Stated Goal: Not asked.   Prior Functioning     Home Living Lives With: Spouse Available Help at Discharge: Family Type of Home: Other (Comment) (condo) Home Access: Stairs to enter Entrance Stairs-Number of Steps: 3+1 Entrance Stairs-Rails: Right;Left Home Layout: Two level;Able to live on main level with bedroom/bathroom Bathroom Shower/Tub: Health visitor: Standard Home Adaptive Equipment: Shower chair with back;Walker - rolling;Crutches;Straight cane;Bedside commode/3-in-1 Prior Function Level of Independence: Independent Able to Take Stairs?: Yes Driving: Yes Vocation: Retired Musician: No difficulties Dominant Hand: Right         Vision/Perception     Cognition  Overall Cognitive Status: Appears within functional limits for tasks assessed/performed Arousal/Alertness: Awake/alert Orientation Level: Appears intact for tasks assessed Behavior During Session: [facility] for tasks performed    Extremity/Trunk Assessment Right Upper Extremity Assessment RUE ROM/Strength/Tone: [facility] for tasks assessed Left Upper Extremity Assessment LUE ROM/Strength/Tone: WFL for tasks assessed     Mobility Bed Mobility Supine to Sit: 5: Supervision;HOB flat Details for Bed Mobility Assistance: min cues for technique and use of RLE to self assist. Transfers Sit to Stand: 5: Supervision;With upper extremity assist;From bed;From chair/3-in-1 Stand to Sit: 5: Supervision;With upper extremity assist;With armrests;To chair/3-in-1 Details for Transfer Assistance: VCs safety, technique, hand placement.      Shoulder Instructions     Exercise     Balance     End of Session OT - End of Session Equipment Utilized During Treatment: Gait belt Activity Tolerance: Patient tolerated treatment  well Patient left: in chair;with call bell/phone within reach  GO  Gladstone Rosas A OTR/L [phone] 11/15/2012, 10:46 AM

## 2012-11-15 NOTE — Progress Notes (Signed)
Physical Therapy Treatment Patient Details Name: Stephanie Baker MRN: [id number] DOB: 1941/06/01 Today's Date: 11/15/2012 Time: 8657-8469 PT Time Calculation (min): 14 min  PT Assessment / Plan / Recommendation Comments on Treatment Session  All education completed. Practiced steps. No further questions/concerns from pt. Ready for d/c home. HHPT    Follow Up Recommendations  Home health PT     Does the patient have the potential to tolerate intense rehabilitation     Barriers to Discharge        Equipment Recommendations  None recommended by PT;None recommended by OT    Recommendations for Other Services    Frequency 7X/week   Plan Discharge plan remains appropriate    Precautions / Restrictions Precautions Precautions: Knee Required Braces or Orthoses: Knee Immobilizer - Left (DC'd KI 11/15/12-able to SLR) Knee Immobilizer - Left: Discontinue once straight leg raise with < 10 degree lag Restrictions Weight Bearing Restrictions: No LLE Weight Bearing: Weight bearing as tolerated   Pertinent Vitals/Pain 2-3/10 L knee    Mobility  Bed Mobility Supine to Sit: 5: Supervision;HOB flat Details for Bed Mobility Assistance: min cues for technique and use of RLE to self assist. Transfers Transfers: Sit to Stand;Stand to Sit Sit to Stand: 5: Supervision;From chair/3-in-1 Stand to Sit: 5: Supervision;To chair/3-in-1 Details for Transfer Assistance: VCs safety, technique, hand placement.  Ambulation/Gait Ambulation/Gait Assistance: 5: Supervision Ambulation Distance (Feet): 200 Feet Assistive device: Rolling walker Ambulation/Gait Assistance Details: Min-guard only due to pt feeling a little lightheaded from pain meds. Slow gait speed.  Gait Pattern: Step-to pattern;Step-through pattern;Antalgic Stairs: Yes Stairs Assistance: 4: Min guard Stair Management Technique: One rail Left;With crutches Number of Stairs: 4     Exercises Total Joint Exercises Ankle Circles/Pumps:  AROM;Both;15 reps;Seated Quad Sets: AROM;Both;15 reps;Seated Short Arc Quad: AROM;Left;15 reps;Seated Heel Slides: AAROM;Left;15 reps;Seated Hip ABduction/ADduction: AROM;Left;15 reps;Seated Straight Leg Raises: AROM;Left;15 reps;Seated   PT Diagnosis:    PT Problem List:   PT Treatment Interventions:     PT Goals Acute Rehab PT Goals Pt will go Supine/Side to Sit: with modified independence PT Goal: Supine/Side to Sit - Progress: Progressing toward goal Pt will go Sit to Stand: with supervision PT Goal: Sit to Stand - Progress: Met Pt will Ambulate: 51 - 150 feet;with supervision;with least restrictive assistive device PT Goal: Ambulate - Progress: Met Pt will Go Up / Down Stairs: 3-5 stairs;with supervision;with least restrictive assistive device PT Goal: Up/Down Stairs - Progress: Progressing toward goal  Visit Information  Last PT Received On: 11/15/12 Assistance Needed: +1    Subjective Data  Subjective: "I'm ready" Patient Stated Goal: Home   Cognition  Overall Cognitive Status: Appears within functional limits for tasks assessed/performed Arousal/Alertness: Awake/alert Orientation Level: Appears intact for tasks assessed Behavior During Session: [facility] for tasks performed    Balance     End of Session PT - End of Session Equipment Utilized During Treatment: Gait belt Activity Tolerance: Patient tolerated treatment well Patient left: in chair;with call bell/phone within reach   GP     Rebeca Alert [facility] 11/15/2012, 12:33 PM [phone]

## 2012-11-15 NOTE — Care Management Note (Signed)
    Page 1 of 2   11/15/2012     6:17:35 PM   CARE MANAGEMENT NOTE 11/15/2012  Patient:  Stephanie Baker, Stephanie Baker   Account Number:  [id number]  Date Initiated:  11/15/2012  Documentation initiated by:  Colleen Can  Subjective/Objective Assessment:   dx osteoarthritis knee-left; partial knee replacemnt     Action/Plan:   CM spoke with patient. Plans are to return to her home in [place] where spouse will be caregiver. She will need RW. Maryjean Ka for [facility] services   Anticipated DC Date:  11/15/2012   Anticipated DC Plan:  HOME W HOME HEALTH SERVICES  In-house referral  Clinical Social Worker      DC Planning Services  CM consult      [facility] Choice  HOME HEALTH  DURABLE MEDICAL EQUIPMENT   Choice offered to / List presented to:  C-1 Patient   DME arranged  Levan Hurst      DME agency  [facility]     HH arranged  HH-2 PT      [facility] agency  [facility]   Status of service:  Completed, signed off Medicare Important Message given?  NA - LOS <3 / Initial given by admissions (If response is "NO", the following Medicare IM given date fields will be blank) Date Medicare IM given:   Date Additional Medicare IM given:    Discharge Disposition:  HOME W HOME HEALTH SERVICES  Per UR Regulation:    If discussed at Long Length of Stay Meetings, dates discussed:    Comments:  11/15/2012 Damaris Schooner RN CCM [phone] Genevieve Norlander will provide [facility] services with start date of tomorrow 11/16/2012. RW has been del to pt's room.

## 2012-11-15 NOTE — Progress Notes (Signed)
   Subjective: 1 Day Post-Op Procedure(s) (LRB): UNICOMPARTMENTAL KNEE (Left)   Patient reports pain as mild, pain well controlled. No events throughout the night. Ready to be discharged home.  Objective:   VITALS:   Filed Vitals:   11/15/12  BP: 108/61  Pulse: 56  Temp: 97.3 F (36.3 C)   Resp: 16    Neurovascular intact Dorsiflexion/Plantar flexion intact Incision: dressing C/D/I No cellulitis present Compartment soft  LABS  Basename 11/15/12 0425  HGB 11.9*  HCT 34.5*  WBC 11.4*  PLT 264     Basename 11/15/12 0425  NA 136  K 3.7  BUN 18  CREATININE 0.54  GLUCOSE 124*     Assessment/Plan: 1 Day Post-Op Procedure(s) (LRB): UNICOMPARTMENTAL KNEE (Left) Foley cath d/c'ed HV drain d/c'ed Advance diet Up with therapy D/C IV fluids Discharge home with home health Follow up in 2 weeks at [facility]. Follow up with OLIN,Samadhi Mahurin D in 2 weeks.  Contact information:  [facility] [address] [place] [address] [phone]    Expected ABLA  Treated with iron and will observe  Overweight (BMI 25-29.9)  Estimated Body mass index is 25.79 kg/(m^2) as calculated from the following:   Height as of this encounter: 5\' 5" (1.651 m).   Weight as of this encounter: 155 lb(70.308 kg). Patient also counseled that weight may inhibit the healing process Patient counseled that losing weight will help with future health issues      Anastasio Auerbach. Shiva Karis   PAC  11/15/2012, 11:14 AM

## 2012-11-15 NOTE — Progress Notes (Signed)
Physical Therapy Treatment Patient Details Name: Stephanie Baker MRN: [id number] DOB: 04-17-1941 Today's Date: 11/15/2012 Time: 6213-0865 PT Time Calculation (min): 23 min  PT Assessment / Plan / Recommendation Comments on Treatment Session  Progressing well. Plan is for d/c home later today. Will practice steps on next session. HHPT.     Follow Up Recommendations  Home health PT     Does the patient have the potential to tolerate intense rehabilitation     Barriers to Discharge        Equipment Recommendations  None recommended by PT;None recommended by OT    Recommendations for Other Services    Frequency 7X/week   Plan Discharge plan remains appropriate    Precautions / Restrictions Precautions Precautions: Knee Required Braces or Orthoses: Knee Immobilizer - Left (DC'd KI 11/15/12-able to SLR) Knee Immobilizer - Left: Discontinue once straight leg raise with < 10 degree lag Restrictions Weight Bearing Restrictions: No LLE Weight Bearing: Weight bearing as tolerated   Pertinent Vitals/Pain 2-3/10 L knee    Mobility  Bed Mobility Supine to Sit: 5: Supervision;HOB flat Details for Bed Mobility Assistance: min cues for technique and use of RLE to self assist. Transfers Transfers: Sit to Stand;Stand to Sit Sit to Stand: 5: Supervision;From chair/3-in-1 Stand to Sit: 5: Supervision;To chair/3-in-1 Details for Transfer Assistance: VCs safety, technique, hand placement.  Ambulation/Gait Ambulation/Gait Assistance: 4: Min guard Ambulation Distance (Feet): 120 Feet Ambulation/Gait Assistance Details: Min-guard only due to pt feeling a little lightheaded from pain meds. Slow gait speed.  Gait Pattern: Step-to pattern;Step-through pattern;Decreased stride length    Exercises Total Joint Exercises Ankle Circles/Pumps: AROM;Both;15 reps;Seated Quad Sets: AROM;Both;15 reps;Seated Short Arc Quad: AROM;Left;15 reps;Seated Heel Slides: AAROM;Left;15 reps;Seated Hip  ABduction/ADduction: AROM;Left;15 reps;Seated Straight Leg Raises: AROM;Left;15 reps;Seated   PT Diagnosis:    PT Problem List:   PT Treatment Interventions:     PT Goals Acute Rehab PT Goals Pt will go Supine/Side to Sit: with modified independence PT Goal: Supine/Side to Sit - Progress: Progressing toward goal Pt will go Sit to Stand: with supervision PT Goal: Sit to Stand - Progress: Progressing toward goal Pt will Ambulate: 51 - 150 feet;with supervision;with least restrictive assistive device PT Goal: Ambulate - Progress: Progressing toward goal  Visit Information  Last PT Received On: 11/15/12 Assistance Needed: +1    Subjective Data  Subjective: "I'm going home today" Patient Stated Goal: Home   Cognition  Overall Cognitive Status: Appears within functional limits for tasks assessed/performed Arousal/Alertness: Awake/alert Orientation Level: Appears intact for tasks assessed Behavior During Session: [facility] for tasks performed    Balance     End of Session PT - End of Session Equipment Utilized During Treatment: Gait belt Activity Tolerance: Patient tolerated treatment well Patient left: in chair;with call bell/phone within reach   GP     Rebeca Alert [facility] 11/15/2012, 11:53 AM [phone]

## 2012-11-15 NOTE — Progress Notes (Signed)
CSW consulted for SNF placement. PN reviewed. PT is recommending HHPT following hospital d/c. RNCM will assist with d/c planning. CSW is available to assist with d/c planning if plan changes an SNF is required.  Cori Razor LCSW [phone]

## 2012-11-16 DIAGNOSIS — Z96659 Presence of unspecified artificial knee joint: Secondary | ICD-10-CM | POA: Diagnosis not present

## 2012-11-16 DIAGNOSIS — IMO0001 Reserved for inherently not codable concepts without codable children: Secondary | ICD-10-CM | POA: Diagnosis not present

## 2012-11-16 DIAGNOSIS — Z7982 Long term (current) use of aspirin: Secondary | ICD-10-CM | POA: Diagnosis not present

## 2012-11-16 DIAGNOSIS — M1710 Unilateral primary osteoarthritis, unspecified knee: Secondary | ICD-10-CM | POA: Diagnosis not present

## 2012-11-16 DIAGNOSIS — I10 Essential (primary) hypertension: Secondary | ICD-10-CM | POA: Diagnosis not present

## 2012-11-16 DIAGNOSIS — Z471 Aftercare following joint replacement surgery: Secondary | ICD-10-CM | POA: Diagnosis not present

## 2012-11-16 DIAGNOSIS — IMO0002 Reserved for concepts with insufficient information to code with codable children: Secondary | ICD-10-CM | POA: Diagnosis not present

## 2012-11-17 DIAGNOSIS — Z471 Aftercare following joint replacement surgery: Secondary | ICD-10-CM | POA: Diagnosis not present

## 2012-11-17 DIAGNOSIS — Z96659 Presence of unspecified artificial knee joint: Secondary | ICD-10-CM | POA: Diagnosis not present

## 2012-11-17 DIAGNOSIS — IMO0001 Reserved for inherently not codable concepts without codable children: Secondary | ICD-10-CM | POA: Diagnosis not present

## 2012-11-17 DIAGNOSIS — I10 Essential (primary) hypertension: Secondary | ICD-10-CM | POA: Diagnosis not present

## 2012-11-17 DIAGNOSIS — Z7982 Long term (current) use of aspirin: Secondary | ICD-10-CM | POA: Diagnosis not present

## 2012-11-21 DIAGNOSIS — R82998 Other abnormal findings in urine: Secondary | ICD-10-CM | POA: Diagnosis not present

## 2012-11-21 DIAGNOSIS — Z471 Aftercare following joint replacement surgery: Secondary | ICD-10-CM | POA: Diagnosis not present

## 2012-11-21 DIAGNOSIS — Z7982 Long term (current) use of aspirin: Secondary | ICD-10-CM | POA: Diagnosis not present

## 2012-11-21 DIAGNOSIS — I10 Essential (primary) hypertension: Secondary | ICD-10-CM | POA: Diagnosis not present

## 2012-11-21 DIAGNOSIS — Z96659 Presence of unspecified artificial knee joint: Secondary | ICD-10-CM | POA: Diagnosis not present

## 2012-11-21 DIAGNOSIS — IMO0001 Reserved for inherently not codable concepts without codable children: Secondary | ICD-10-CM | POA: Diagnosis not present

## 2012-11-21 DIAGNOSIS — R300 Dysuria: Secondary | ICD-10-CM | POA: Diagnosis not present

## 2012-11-22 DIAGNOSIS — Z7982 Long term (current) use of aspirin: Secondary | ICD-10-CM | POA: Diagnosis not present

## 2012-11-22 DIAGNOSIS — Z471 Aftercare following joint replacement surgery: Secondary | ICD-10-CM | POA: Diagnosis not present

## 2012-11-22 DIAGNOSIS — IMO0001 Reserved for inherently not codable concepts without codable children: Secondary | ICD-10-CM | POA: Diagnosis not present

## 2012-11-22 DIAGNOSIS — Z96659 Presence of unspecified artificial knee joint: Secondary | ICD-10-CM | POA: Diagnosis not present

## 2012-11-22 DIAGNOSIS — I10 Essential (primary) hypertension: Secondary | ICD-10-CM | POA: Diagnosis not present

## 2012-11-24 DIAGNOSIS — IMO0001 Reserved for inherently not codable concepts without codable children: Secondary | ICD-10-CM | POA: Diagnosis not present

## 2012-11-24 DIAGNOSIS — Z471 Aftercare following joint replacement surgery: Secondary | ICD-10-CM | POA: Diagnosis not present

## 2012-11-24 DIAGNOSIS — Z96659 Presence of unspecified artificial knee joint: Secondary | ICD-10-CM | POA: Diagnosis not present

## 2012-11-24 DIAGNOSIS — Z7982 Long term (current) use of aspirin: Secondary | ICD-10-CM | POA: Diagnosis not present

## 2012-11-24 DIAGNOSIS — I10 Essential (primary) hypertension: Secondary | ICD-10-CM | POA: Diagnosis not present

## 2012-11-27 DIAGNOSIS — Z96659 Presence of unspecified artificial knee joint: Secondary | ICD-10-CM | POA: Diagnosis not present

## 2012-11-27 DIAGNOSIS — Z7982 Long term (current) use of aspirin: Secondary | ICD-10-CM | POA: Diagnosis not present

## 2012-11-27 DIAGNOSIS — Z471 Aftercare following joint replacement surgery: Secondary | ICD-10-CM | POA: Diagnosis not present

## 2012-11-27 DIAGNOSIS — I10 Essential (primary) hypertension: Secondary | ICD-10-CM | POA: Diagnosis not present

## 2012-11-27 DIAGNOSIS — IMO0001 Reserved for inherently not codable concepts without codable children: Secondary | ICD-10-CM | POA: Diagnosis not present

## 2012-11-28 DIAGNOSIS — IMO0001 Reserved for inherently not codable concepts without codable children: Secondary | ICD-10-CM | POA: Diagnosis not present

## 2012-11-28 DIAGNOSIS — Z471 Aftercare following joint replacement surgery: Secondary | ICD-10-CM | POA: Diagnosis not present

## 2012-11-28 DIAGNOSIS — Z7982 Long term (current) use of aspirin: Secondary | ICD-10-CM | POA: Diagnosis not present

## 2012-11-28 DIAGNOSIS — I10 Essential (primary) hypertension: Secondary | ICD-10-CM | POA: Diagnosis not present

## 2012-11-28 DIAGNOSIS — Z96659 Presence of unspecified artificial knee joint: Secondary | ICD-10-CM | POA: Diagnosis not present

## 2012-11-30 DIAGNOSIS — Z4789 Encounter for other orthopedic aftercare: Secondary | ICD-10-CM | POA: Diagnosis not present

## 2012-12-04 DIAGNOSIS — E785 Hyperlipidemia, unspecified: Secondary | ICD-10-CM | POA: Diagnosis not present

## 2012-12-04 DIAGNOSIS — IMO0002 Reserved for concepts with insufficient information to code with codable children: Secondary | ICD-10-CM | POA: Diagnosis not present

## 2012-12-04 DIAGNOSIS — E559 Vitamin D deficiency, unspecified: Secondary | ICD-10-CM | POA: Diagnosis not present

## 2012-12-04 DIAGNOSIS — R82998 Other abnormal findings in urine: Secondary | ICD-10-CM | POA: Diagnosis not present

## 2012-12-04 DIAGNOSIS — I10 Essential (primary) hypertension: Secondary | ICD-10-CM | POA: Diagnosis not present

## 2012-12-04 DIAGNOSIS — M1710 Unilateral primary osteoarthritis, unspecified knee: Secondary | ICD-10-CM | POA: Diagnosis not present

## 2012-12-05 DIAGNOSIS — H02059 Trichiasis without entropian unspecified eye, unspecified eyelid: Secondary | ICD-10-CM | POA: Diagnosis not present

## 2012-12-05 DIAGNOSIS — H04129 Dry eye syndrome of unspecified lacrimal gland: Secondary | ICD-10-CM | POA: Diagnosis not present

## 2012-12-05 DIAGNOSIS — H43819 Vitreous degeneration, unspecified eye: Secondary | ICD-10-CM | POA: Diagnosis not present

## 2012-12-05 DIAGNOSIS — H01009 Unspecified blepharitis unspecified eye, unspecified eyelid: Secondary | ICD-10-CM | POA: Diagnosis not present

## 2012-12-07 DIAGNOSIS — M1710 Unilateral primary osteoarthritis, unspecified knee: Secondary | ICD-10-CM | POA: Diagnosis not present

## 2012-12-07 DIAGNOSIS — IMO0002 Reserved for concepts with insufficient information to code with codable children: Secondary | ICD-10-CM | POA: Diagnosis not present

## 2012-12-11 DIAGNOSIS — I10 Essential (primary) hypertension: Secondary | ICD-10-CM | POA: Diagnosis not present

## 2012-12-11 DIAGNOSIS — Z1331 Encounter for screening for depression: Secondary | ICD-10-CM | POA: Diagnosis not present

## 2012-12-11 DIAGNOSIS — Z79899 Other long term (current) drug therapy: Secondary | ICD-10-CM | POA: Diagnosis not present

## 2012-12-11 DIAGNOSIS — Z0000 Encounter for general adult medical examination without abnormal findings: Secondary | ICD-10-CM | POA: Diagnosis not present

## 2012-12-11 DIAGNOSIS — E785 Hyperlipidemia, unspecified: Secondary | ICD-10-CM | POA: Diagnosis not present

## 2012-12-12 DIAGNOSIS — M1710 Unilateral primary osteoarthritis, unspecified knee: Secondary | ICD-10-CM | POA: Diagnosis not present

## 2012-12-12 DIAGNOSIS — IMO0002 Reserved for concepts with insufficient information to code with codable children: Secondary | ICD-10-CM | POA: Diagnosis not present

## 2012-12-14 DIAGNOSIS — Z1212 Encounter for screening for malignant neoplasm of rectum: Secondary | ICD-10-CM | POA: Diagnosis not present

## 2013-01-10 DIAGNOSIS — Z96659 Presence of unspecified artificial knee joint: Secondary | ICD-10-CM | POA: Diagnosis not present

## 2013-01-22 ENCOUNTER — Encounter: Payer: Self-pay | Admitting: Gastroenterology

## 2013-02-14 ENCOUNTER — Encounter: Payer: Self-pay | Admitting: Gastroenterology

## 2013-03-06 DIAGNOSIS — H04129 Dry eye syndrome of unspecified lacrimal gland: Secondary | ICD-10-CM | POA: Diagnosis not present

## 2013-03-06 DIAGNOSIS — H01009 Unspecified blepharitis unspecified eye, unspecified eyelid: Secondary | ICD-10-CM | POA: Diagnosis not present

## 2013-03-12 ENCOUNTER — Ambulatory Visit (AMBULATORY_SURGERY_CENTER): Payer: Medicare Other | Admitting: *Deleted

## 2013-03-12 VITALS — Ht 65.0 in | Wt 151.2 lb

## 2013-03-12 DIAGNOSIS — Z1211 Encounter for screening for malignant neoplasm of colon: Secondary | ICD-10-CM

## 2013-03-12 MED ORDER — MOVIPREP 100 G PO SOLR: ORAL | Status: DC

## 2013-03-20 DIAGNOSIS — L821 Other seborrheic keratosis: Secondary | ICD-10-CM | POA: Diagnosis not present

## 2013-03-20 DIAGNOSIS — D1801 Hemangioma of skin and subcutaneous tissue: Secondary | ICD-10-CM | POA: Diagnosis not present

## 2013-03-20 DIAGNOSIS — L739 Follicular disorder, unspecified: Secondary | ICD-10-CM | POA: Diagnosis not present

## 2013-03-20 DIAGNOSIS — L7211 Pilar cyst: Secondary | ICD-10-CM | POA: Diagnosis not present

## 2013-03-20 DIAGNOSIS — D235 Other benign neoplasm of skin of trunk: Secondary | ICD-10-CM | POA: Diagnosis not present

## 2013-03-26 ENCOUNTER — Encounter: Payer: Medicare Other | Admitting: Gastroenterology

## 2013-04-11 ENCOUNTER — Encounter: Payer: Self-pay | Admitting: Gastroenterology

## 2013-04-11 ENCOUNTER — Ambulatory Visit (AMBULATORY_SURGERY_CENTER): Payer: Medicare Other | Admitting: Gastroenterology

## 2013-04-11 VITALS — BP 107/64 | HR 57 | Temp 97.2°F | Resp 25 | Ht 65.0 in | Wt 151.0 lb

## 2013-04-11 DIAGNOSIS — Z9889 Other specified postprocedural states: Secondary | ICD-10-CM | POA: Diagnosis not present

## 2013-04-11 DIAGNOSIS — D649 Anemia, unspecified: Secondary | ICD-10-CM | POA: Diagnosis not present

## 2013-04-11 DIAGNOSIS — Z1211 Encounter for screening for malignant neoplasm of colon: Secondary | ICD-10-CM | POA: Diagnosis not present

## 2013-04-11 DIAGNOSIS — D126 Benign neoplasm of colon, unspecified: Secondary | ICD-10-CM | POA: Diagnosis not present

## 2013-04-11 MED ORDER — SODIUM CHLORIDE 0.9 % IV SOLN: 500.0000 mL | INTRAVENOUS | Status: DC

## 2013-04-11 NOTE — Patient Instructions (Signed)
Colon polyp x 1 removed today, see polyp handout given. Await pathology results. Resume current medications. Call us with any questions or concerns.   Thank you!  YOU HAD AN ENDOSCOPIC PROCEDURE TODAY AT THE [facility]: Refer to the procedure report that was given to you for any specific questions about what was found during the examination.  If the procedure report does not answer your questions, please call your gastroenterologist to clarify.  If you requested that your care partner not be given the details of your procedure findings, then the procedure report has been included in a sealed envelope for you to review at your convenience later.  YOU SHOULD EXPECT: Some feelings of bloating in the abdomen. Passage of more gas than usual.  Walking can help get rid of the air that was put into your GI tract during the procedure and reduce the bloating. If you had a lower endoscopy (such as a colonoscopy or flexible sigmoidoscopy) you may notice spotting of blood in your stool or on the toilet paper. If you underwent a bowel prep for your procedure, then you may not have a normal bowel movement for a few days.  DIET: Your first meal following the procedure should be a light meal and then it is ok to progress to your normal diet.  A half-sandwich or bowl of soup is an example of a good first meal.  Heavy or fried foods are harder to digest and may make you feel nauseous or bloated.  Likewise meals heavy in dairy and vegetables can cause extra gas to form and this can also increase the bloating.  Drink plenty of fluids but you should avoid alcoholic beverages for 24 hours.  ACTIVITY: Your care partner should take you home directly after the procedure.  You should plan to take it easy, moving slowly for the rest of the day.  You can resume normal activity the day after the procedure however you should NOT DRIVE or use heavy machinery for 24 hours (because of the sedation medicines used during the  test).    SYMPTOMS TO REPORT IMMEDIATELY: A gastroenterologist can be reached at any hour.  During normal business hours, 8:30 AM to 5:00 PM Monday through Friday, call [phone].  After hours and on weekends, please call the GI answering service at [phone] who will take a message and have the physician on call contact you.   Following lower endoscopy (colonoscopy or flexible sigmoidoscopy):  Excessive amounts of blood in the stool  Significant tenderness or worsening of abdominal pains  Swelling of the abdomen that is new, acute  Fever of 100F or higher  Following upper endoscopy (EGD)  Vomiting of blood or coffee ground material  New chest pain or pain under the shoulder blades  Painful or persistently difficult swallowing  New shortness of breath  Fever of 100F or higher  Black, tarry-looking stools  FOLLOW UP: If any biopsies were taken you will be contacted by phone or by letter within the next 1-3 weeks.  Call your gastroenterologist if you have not heard about the biopsies in 3 weeks.  Our staff will call the home number listed on your records the next business day following your procedure to check on you and address any questions or concerns that you may have at that time regarding the information given to you following your procedure. This is a courtesy call and so if there is no answer at the home number and we have not heard  from you through the emergency physician on call, we will assume that you have returned to your regular daily activities without incident.  SIGNATURES/CONFIDENTIALITY: You and/or your care partner have signed paperwork which will be entered into your electronic medical record.  These signatures attest to the fact that that the information above on your After Visit Summary has been reviewed and is understood.  Full responsibility of the confidentiality of this discharge information lies with you and/or your care-partner.

## 2013-04-11 NOTE — Progress Notes (Signed)
NO EGG OR SOY ALLERGY. EWM 

## 2013-04-11 NOTE — Progress Notes (Signed)
Called to room to assist during endoscopic procedure.  Patient ID and intended procedure confirmed with present staff. Received instructions for my participation in the procedure from the performing physician.  

## 2013-04-11 NOTE — Op Note (Signed)
[facility] [address] [place], [address]   COLONOSCOPY PROCEDURE REPORT  PATIENT: Stephanie Baker, Stephanie Baker  MR#: [id number] BIRTHDATE: 1941-05-31 , 71  yrs. old GENDER: Female ENDOSCOPIST: Mardella Layman, MD, [facility] REFERRED BY: PROCEDURE DATE:  04/11/2013 PROCEDURE:   Colonoscopy with snare polypectomy ASA CLASS:   Class II INDICATIONS:Average risk patient for colon cancer. MEDICATIONS: propofol (Diprivan) 250mg  IV  DESCRIPTION OF PROCEDURE:   After the risks and benefits and of the procedure were explained, informed consent was obtained.  A digital rectal exam revealed no abnormalities of the rectum.    The LB [id number]  endoscope was introduced through the anus and advanced to the cecum, which was identified by both the appendix and ileocecal valve .  The quality of the prep was excellent, using MoviPrep .  The instrument was then slowly withdrawn as the colon was fully examined.     COLON FINDINGS: A smooth sessile polyp ranging between 3-61mm in size was found at the cecum.  A polypectomy was performed with a cold snare.  The resection was complete and the polyp tissue was completely retrieved.   The colon was otherwise normal.  There was no diverticulosis, inflammation, polyps or cancers unless previously stated.     Retroflexed views revealed no abnormalities. The scope was then withdrawn from the patient and the procedure completed.  COMPLICATIONS: There were no complications. ENDOSCOPIC IMPRESSION: 1.   Sessile polyp ranging between 3-51mm in size was found at the cecum; polypectomy was performed with a cold snare.Marland KitchenMarland KitchenR/O adenoma. 2.   The colon was otherwise normal  RECOMMENDATIONS: 1.  Await pathology results 2.  Repeat colonoscopy in 5 years if polyp adenomatous; otherwise 10 years   REPEAT EXAM:  XB:JYNWGN Eloise Harman, MD  _______________________________ eSigned:  Mardella Layman, MD, [facility] 04/11/2013 11:18 AM

## 2013-04-11 NOTE — Progress Notes (Signed)
Patient did not have preoperative order for IV antibiotic SSI prophylaxis. (G8918)  Patient did not experience any of the following events: a burn prior to discharge; a fall within the facility; wrong site/side/patient/procedure/implant event; or a hospital transfer or hospital admission upon discharge from the facility. (G8907)  

## 2013-04-12 ENCOUNTER — Telehealth: Payer: Self-pay | Admitting: *Deleted

## 2013-04-12 NOTE — Telephone Encounter (Signed)
  Follow up Call-  Call back number 04/11/2013  Post procedure Call Back phone  # [phone]  Permission to leave phone message Yes     Patient questions:  Do you have a fever, pain , or abdominal swelling? no Pain Score  0 *  Have you tolerated food without any problems? yes  Have you been able to return to your normal activities? yes  Do you have any questions about your discharge instructions: Diet   no Medications  no Follow up visit  no  Do you have questions or concerns about your Care? no  Actions: * If pain score is 4 or above: No action needed, pain <4.

## 2013-04-18 ENCOUNTER — Encounter: Payer: Self-pay | Admitting: Gastroenterology

## 2013-04-23 ENCOUNTER — Other Ambulatory Visit: Payer: Self-pay

## 2013-04-23 DIAGNOSIS — Z1231 Encounter for screening mammogram for malignant neoplasm of breast: Secondary | ICD-10-CM

## 2013-05-07 DIAGNOSIS — H04129 Dry eye syndrome of unspecified lacrimal gland: Secondary | ICD-10-CM | POA: Diagnosis not present

## 2013-05-07 DIAGNOSIS — H01009 Unspecified blepharitis unspecified eye, unspecified eyelid: Secondary | ICD-10-CM | POA: Diagnosis not present

## 2013-05-10 ENCOUNTER — Ambulatory Visit
Admission: RE | Admit: 2013-05-10 | Discharge: 2013-05-10 | Disposition: A | Discharge status: Home / Self Care | Payer: Medicare Other | Source: Ambulatory Visit

## 2013-05-10 DIAGNOSIS — Z1231 Encounter for screening mammogram for malignant neoplasm of breast: Secondary | ICD-10-CM | POA: Diagnosis not present

## 2013-07-30 DIAGNOSIS — H04129 Dry eye syndrome of unspecified lacrimal gland: Secondary | ICD-10-CM | POA: Diagnosis not present

## 2013-07-30 DIAGNOSIS — H02059 Trichiasis without entropian unspecified eye, unspecified eyelid: Secondary | ICD-10-CM | POA: Diagnosis not present

## 2013-07-30 DIAGNOSIS — H01009 Unspecified blepharitis unspecified eye, unspecified eyelid: Secondary | ICD-10-CM | POA: Diagnosis not present

## 2013-10-02 ENCOUNTER — Other Ambulatory Visit: Payer: Self-pay | Admitting: Internal Medicine

## 2013-10-02 DIAGNOSIS — Z6825 Body mass index (BMI) 25.0-25.9, adult: Secondary | ICD-10-CM | POA: Diagnosis not present

## 2013-10-02 DIAGNOSIS — E041 Nontoxic single thyroid nodule: Secondary | ICD-10-CM | POA: Diagnosis not present

## 2013-10-02 DIAGNOSIS — Z23 Encounter for immunization: Secondary | ICD-10-CM | POA: Diagnosis not present

## 2013-10-03 ENCOUNTER — Ambulatory Visit
Admission: RE | Admit: 2013-10-03 | Discharge: 2013-10-03 | Disposition: A | Discharge status: Home / Self Care | Payer: Medicare Other | Source: Ambulatory Visit | Attending: Internal Medicine | Admitting: Internal Medicine

## 2013-10-03 DIAGNOSIS — E041 Nontoxic single thyroid nodule: Secondary | ICD-10-CM | POA: Diagnosis not present

## 2013-10-04 ENCOUNTER — Other Ambulatory Visit: Payer: Self-pay | Admitting: Internal Medicine

## 2013-10-04 DIAGNOSIS — E041 Nontoxic single thyroid nodule: Secondary | ICD-10-CM

## 2013-10-08 ENCOUNTER — Other Ambulatory Visit (HOSPITAL_COMMUNITY): Payer: Medicare Other

## 2013-10-09 ENCOUNTER — Other Ambulatory Visit (HOSPITAL_COMMUNITY)
Admission: RE | Admit: 2013-10-09 | Discharge: 2013-10-09 | Disposition: A | Discharge status: Home / Self Care | Payer: Medicare Other | Source: Ambulatory Visit | Attending: Interventional Radiology | Admitting: Interventional Radiology

## 2013-10-09 ENCOUNTER — Ambulatory Visit
Admission: RE | Admit: 2013-10-09 | Discharge: 2013-10-09 | Disposition: A | Discharge status: Home / Self Care | Payer: Medicare Other | Source: Ambulatory Visit | Attending: Internal Medicine | Admitting: Internal Medicine

## 2013-10-09 DIAGNOSIS — E049 Nontoxic goiter, unspecified: Secondary | ICD-10-CM | POA: Diagnosis not present

## 2013-10-09 DIAGNOSIS — E041 Nontoxic single thyroid nodule: Secondary | ICD-10-CM | POA: Insufficient documentation

## 2013-10-10 DIAGNOSIS — M19049 Primary osteoarthritis, unspecified hand: Secondary | ICD-10-CM | POA: Diagnosis not present

## 2013-10-11 DIAGNOSIS — M19049 Primary osteoarthritis, unspecified hand: Secondary | ICD-10-CM | POA: Diagnosis not present

## 2013-10-17 DIAGNOSIS — M19049 Primary osteoarthritis, unspecified hand: Secondary | ICD-10-CM | POA: Diagnosis not present

## 2013-10-23 DIAGNOSIS — M19049 Primary osteoarthritis, unspecified hand: Secondary | ICD-10-CM | POA: Diagnosis not present

## 2013-11-06 DIAGNOSIS — M19039 Primary osteoarthritis, unspecified wrist: Secondary | ICD-10-CM | POA: Diagnosis not present

## 2013-11-06 DIAGNOSIS — G8918 Other acute postprocedural pain: Secondary | ICD-10-CM | POA: Diagnosis not present

## 2013-11-19 DIAGNOSIS — M19039 Primary osteoarthritis, unspecified wrist: Secondary | ICD-10-CM | POA: Diagnosis not present

## 2013-12-03 DIAGNOSIS — Z4789 Encounter for other orthopedic aftercare: Secondary | ICD-10-CM | POA: Diagnosis not present

## 2013-12-03 DIAGNOSIS — M19039 Primary osteoarthritis, unspecified wrist: Secondary | ICD-10-CM | POA: Diagnosis not present

## 2013-12-10 DIAGNOSIS — E559 Vitamin D deficiency, unspecified: Secondary | ICD-10-CM | POA: Diagnosis not present

## 2013-12-10 DIAGNOSIS — R82998 Other abnormal findings in urine: Secondary | ICD-10-CM | POA: Diagnosis not present

## 2013-12-10 DIAGNOSIS — E041 Nontoxic single thyroid nodule: Secondary | ICD-10-CM | POA: Diagnosis not present

## 2013-12-10 DIAGNOSIS — E785 Hyperlipidemia, unspecified: Secondary | ICD-10-CM | POA: Diagnosis not present

## 2013-12-10 DIAGNOSIS — I10 Essential (primary) hypertension: Secondary | ICD-10-CM | POA: Diagnosis not present

## 2013-12-17 DIAGNOSIS — E559 Vitamin D deficiency, unspecified: Secondary | ICD-10-CM | POA: Diagnosis not present

## 2013-12-17 DIAGNOSIS — R3121 Asymptomatic microscopic hematuria: Secondary | ICD-10-CM | POA: Insufficient documentation

## 2013-12-17 DIAGNOSIS — E785 Hyperlipidemia, unspecified: Secondary | ICD-10-CM | POA: Diagnosis not present

## 2013-12-17 DIAGNOSIS — Z1331 Encounter for screening for depression: Secondary | ICD-10-CM | POA: Diagnosis not present

## 2013-12-17 DIAGNOSIS — M19039 Primary osteoarthritis, unspecified wrist: Secondary | ICD-10-CM | POA: Diagnosis not present

## 2013-12-17 DIAGNOSIS — Z79899 Other long term (current) drug therapy: Secondary | ICD-10-CM | POA: Diagnosis not present

## 2013-12-17 DIAGNOSIS — E041 Nontoxic single thyroid nodule: Secondary | ICD-10-CM | POA: Diagnosis not present

## 2013-12-17 DIAGNOSIS — E039 Hypothyroidism, unspecified: Secondary | ICD-10-CM | POA: Diagnosis not present

## 2013-12-17 DIAGNOSIS — I10 Essential (primary) hypertension: Secondary | ICD-10-CM | POA: Diagnosis not present

## 2013-12-17 DIAGNOSIS — R3129 Other microscopic hematuria: Secondary | ICD-10-CM | POA: Diagnosis not present

## 2013-12-17 DIAGNOSIS — Z0000 Encounter for general adult medical examination without abnormal findings: Secondary | ICD-10-CM | POA: Diagnosis not present

## 2013-12-25 DIAGNOSIS — E559 Vitamin D deficiency, unspecified: Secondary | ICD-10-CM | POA: Diagnosis not present

## 2013-12-25 DIAGNOSIS — Z780 Asymptomatic menopausal state: Secondary | ICD-10-CM | POA: Diagnosis not present

## 2013-12-28 DIAGNOSIS — Z1212 Encounter for screening for malignant neoplasm of rectum: Secondary | ICD-10-CM | POA: Diagnosis not present

## 2013-12-31 DIAGNOSIS — M19039 Primary osteoarthritis, unspecified wrist: Secondary | ICD-10-CM | POA: Diagnosis not present

## 2013-12-31 DIAGNOSIS — Z4789 Encounter for other orthopedic aftercare: Secondary | ICD-10-CM | POA: Diagnosis not present

## 2014-01-08 DIAGNOSIS — W57XXXA Bitten or stung by nonvenomous insect and other nonvenomous arthropods, initial encounter: Secondary | ICD-10-CM | POA: Diagnosis not present

## 2014-01-08 DIAGNOSIS — T148 Other injury of unspecified body region: Secondary | ICD-10-CM | POA: Diagnosis not present

## 2014-01-09 DIAGNOSIS — N281 Cyst of kidney, acquired: Secondary | ICD-10-CM | POA: Diagnosis not present

## 2014-01-09 DIAGNOSIS — R3129 Other microscopic hematuria: Secondary | ICD-10-CM | POA: Diagnosis not present

## 2014-01-09 DIAGNOSIS — M19039 Primary osteoarthritis, unspecified wrist: Secondary | ICD-10-CM | POA: Diagnosis not present

## 2014-01-11 DIAGNOSIS — L821 Other seborrheic keratosis: Secondary | ICD-10-CM | POA: Diagnosis not present

## 2014-01-11 DIAGNOSIS — L723 Sebaceous cyst: Secondary | ICD-10-CM | POA: Diagnosis not present

## 2014-01-11 DIAGNOSIS — D239 Other benign neoplasm of skin, unspecified: Secondary | ICD-10-CM | POA: Diagnosis not present

## 2014-01-11 DIAGNOSIS — T148 Other injury of unspecified body region: Secondary | ICD-10-CM | POA: Diagnosis not present

## 2014-01-11 DIAGNOSIS — L739 Follicular disorder, unspecified: Secondary | ICD-10-CM | POA: Diagnosis not present

## 2014-01-11 DIAGNOSIS — L738 Other specified follicular disorders: Secondary | ICD-10-CM | POA: Diagnosis not present

## 2014-01-11 DIAGNOSIS — D1801 Hemangioma of skin and subcutaneous tissue: Secondary | ICD-10-CM | POA: Diagnosis not present

## 2014-01-14 DIAGNOSIS — N281 Cyst of kidney, acquired: Secondary | ICD-10-CM | POA: Diagnosis not present

## 2014-01-17 DIAGNOSIS — M19039 Primary osteoarthritis, unspecified wrist: Secondary | ICD-10-CM | POA: Diagnosis not present

## 2014-01-18 DIAGNOSIS — R3129 Other microscopic hematuria: Secondary | ICD-10-CM | POA: Diagnosis not present

## 2014-01-18 DIAGNOSIS — N281 Cyst of kidney, acquired: Secondary | ICD-10-CM | POA: Diagnosis not present

## 2014-01-21 DIAGNOSIS — H01009 Unspecified blepharitis unspecified eye, unspecified eyelid: Secondary | ICD-10-CM | POA: Diagnosis not present

## 2014-01-21 DIAGNOSIS — H04129 Dry eye syndrome of unspecified lacrimal gland: Secondary | ICD-10-CM | POA: Diagnosis not present

## 2014-01-21 DIAGNOSIS — M19039 Primary osteoarthritis, unspecified wrist: Secondary | ICD-10-CM | POA: Diagnosis not present

## 2014-01-21 DIAGNOSIS — H2640 Unspecified secondary cataract: Secondary | ICD-10-CM | POA: Diagnosis not present

## 2014-01-21 DIAGNOSIS — Z961 Presence of intraocular lens: Secondary | ICD-10-CM | POA: Diagnosis not present

## 2014-02-18 DIAGNOSIS — M19039 Primary osteoarthritis, unspecified wrist: Secondary | ICD-10-CM | POA: Diagnosis not present

## 2014-02-25 DIAGNOSIS — Z4789 Encounter for other orthopedic aftercare: Secondary | ICD-10-CM | POA: Diagnosis not present

## 2014-02-25 DIAGNOSIS — M19039 Primary osteoarthritis, unspecified wrist: Secondary | ICD-10-CM | POA: Diagnosis not present

## 2014-03-08 DIAGNOSIS — Z4789 Encounter for other orthopedic aftercare: Secondary | ICD-10-CM | POA: Diagnosis not present

## 2014-03-08 DIAGNOSIS — M19049 Primary osteoarthritis, unspecified hand: Secondary | ICD-10-CM | POA: Diagnosis not present

## 2014-03-08 DIAGNOSIS — M19039 Primary osteoarthritis, unspecified wrist: Secondary | ICD-10-CM | POA: Diagnosis not present

## 2014-03-26 DIAGNOSIS — M19039 Primary osteoarthritis, unspecified wrist: Secondary | ICD-10-CM | POA: Diagnosis not present

## 2014-04-02 ENCOUNTER — Other Ambulatory Visit: Payer: Self-pay

## 2014-04-02 DIAGNOSIS — Z1231 Encounter for screening mammogram for malignant neoplasm of breast: Secondary | ICD-10-CM

## 2014-04-15 DIAGNOSIS — Z4789 Encounter for other orthopedic aftercare: Secondary | ICD-10-CM | POA: Diagnosis not present

## 2014-04-30 DIAGNOSIS — E039 Hypothyroidism, unspecified: Secondary | ICD-10-CM | POA: Diagnosis not present

## 2014-05-02 DIAGNOSIS — Z23 Encounter for immunization: Secondary | ICD-10-CM | POA: Diagnosis not present

## 2014-05-09 DIAGNOSIS — M76899 Other specified enthesopathies of unspecified lower limb, excluding foot: Secondary | ICD-10-CM | POA: Diagnosis not present

## 2014-05-13 ENCOUNTER — Ambulatory Visit
Admission: RE | Admit: 2014-05-13 | Discharge: 2014-05-13 | Disposition: A | Discharge status: Home / Self Care | Payer: Medicare Other | Source: Ambulatory Visit

## 2014-05-13 ENCOUNTER — Encounter (INDEPENDENT_AMBULATORY_CARE_PROVIDER_SITE_OTHER): Payer: Self-pay

## 2014-05-13 DIAGNOSIS — Z1231 Encounter for screening mammogram for malignant neoplasm of breast: Secondary | ICD-10-CM

## 2014-05-27 DIAGNOSIS — Z4789 Encounter for other orthopedic aftercare: Secondary | ICD-10-CM | POA: Diagnosis not present

## 2014-07-01 DIAGNOSIS — M25539 Pain in unspecified wrist: Secondary | ICD-10-CM | POA: Diagnosis not present

## 2014-07-01 DIAGNOSIS — Z4789 Encounter for other orthopedic aftercare: Secondary | ICD-10-CM | POA: Diagnosis not present

## 2014-07-17 IMAGING — US US THYROID BIOPSY
1 series · 14 of 15 positions shown · non-contrast
Comparison: 10/03/2013

CLINICAL DATA: Dominant complex cystic left thyroid nodule

ULTRASOUND GUIDED NEEDLE ASPIRATE BIOPSY OF THE THYROID GLAND

[Series 1: us thyroid biopsy · 0.08mm/px · 15 acquisitions, 14 frames shown]
[im 1/15]
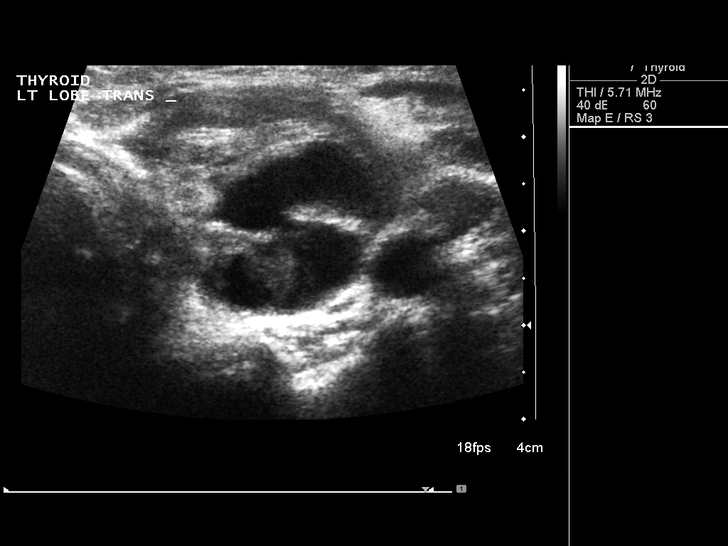
[im 2/15]
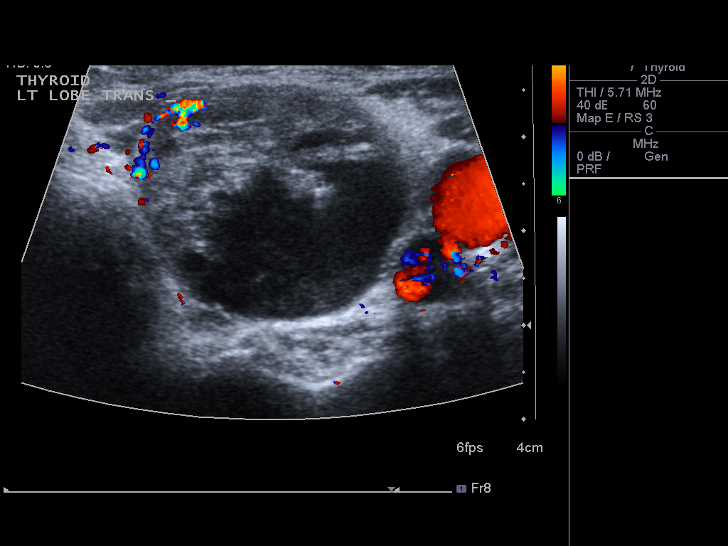
[im 3/15]
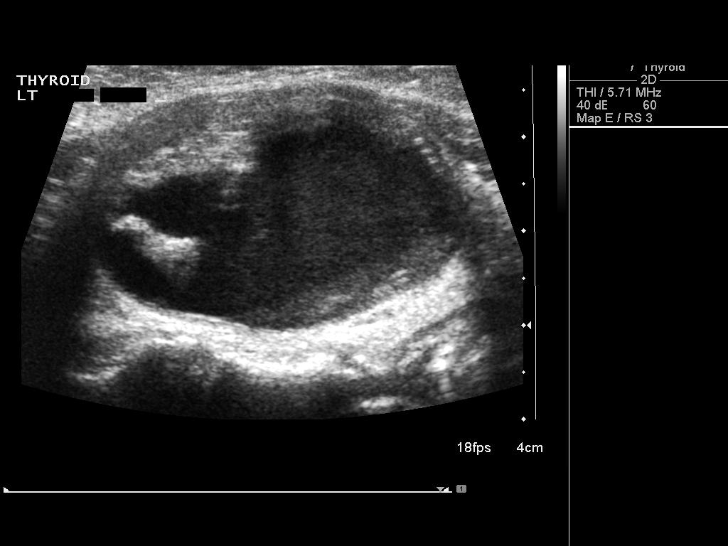
[im 4/15]
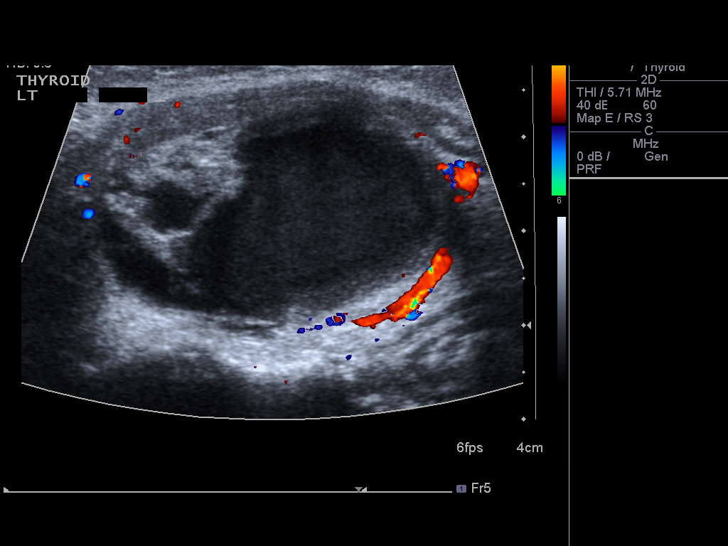
[im 5/15]
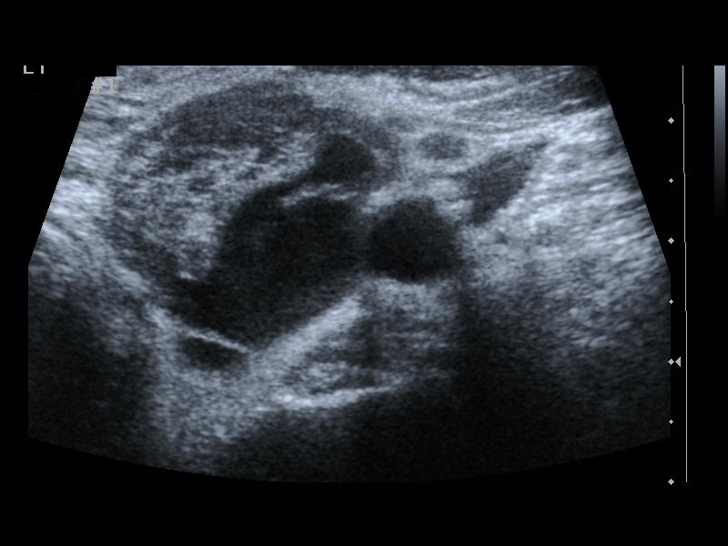
[im 6/15]
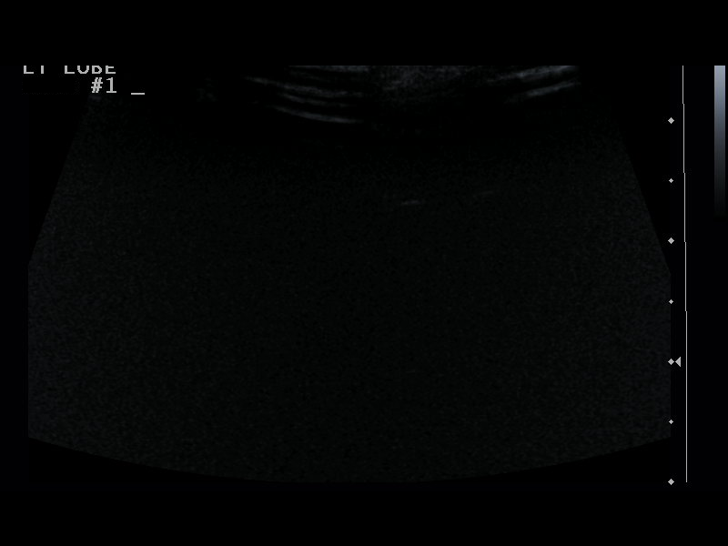
[im 7/15]
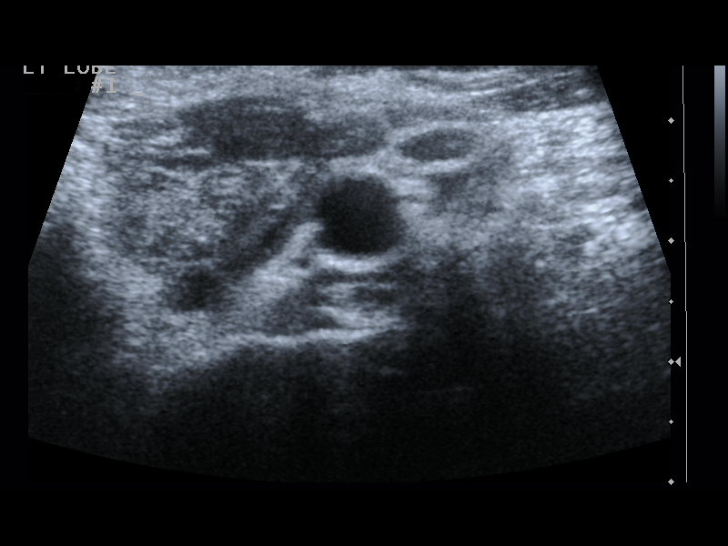
[im 9/15]
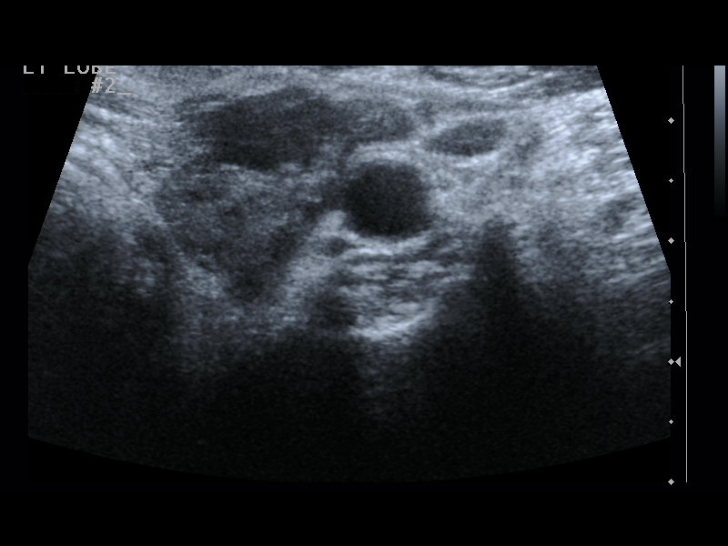
[im 10/15]
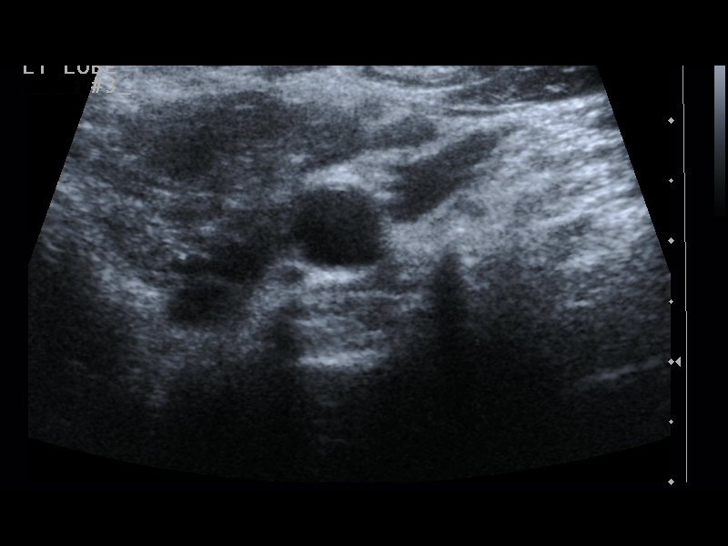
[im 11/15]
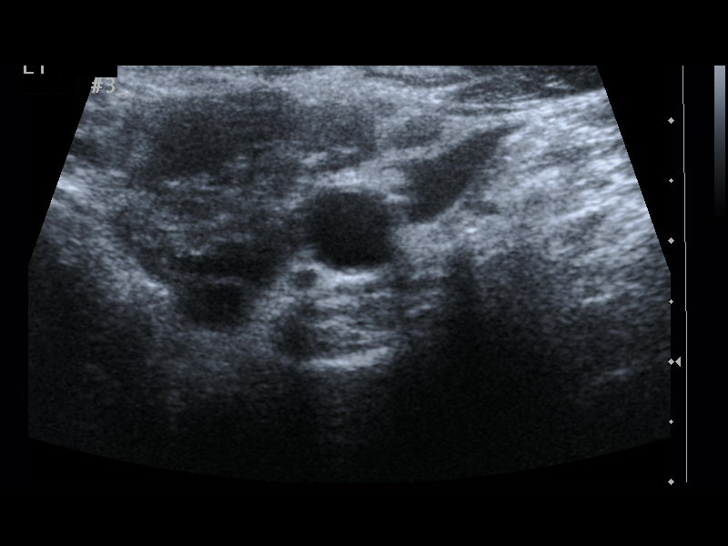
[im 12/15]
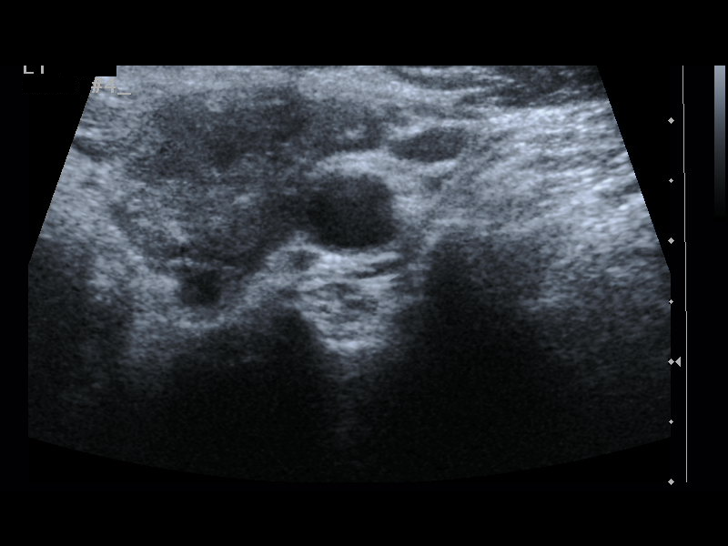
[im 13/15]
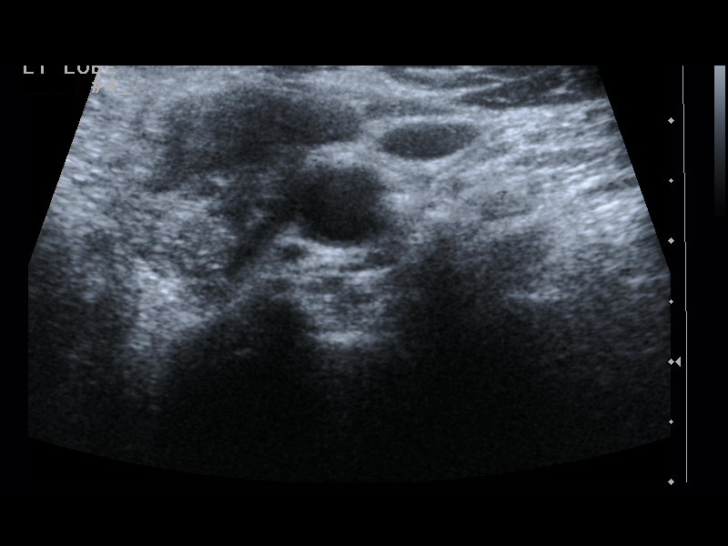
[im 14/15]
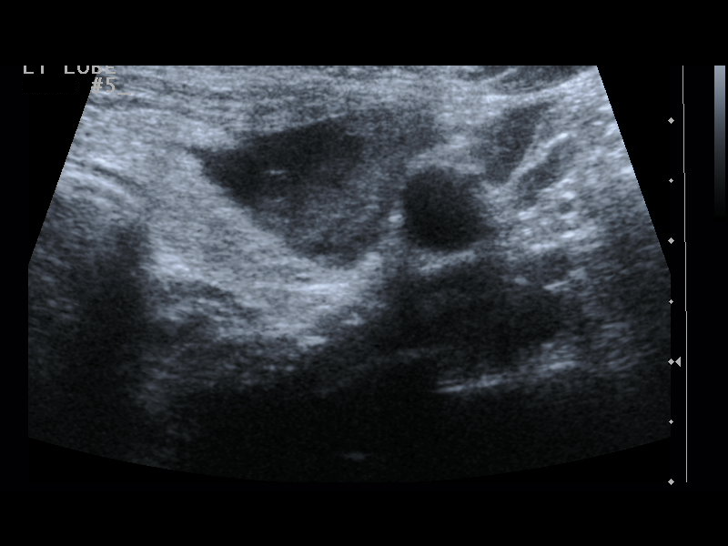
[im 15/15]
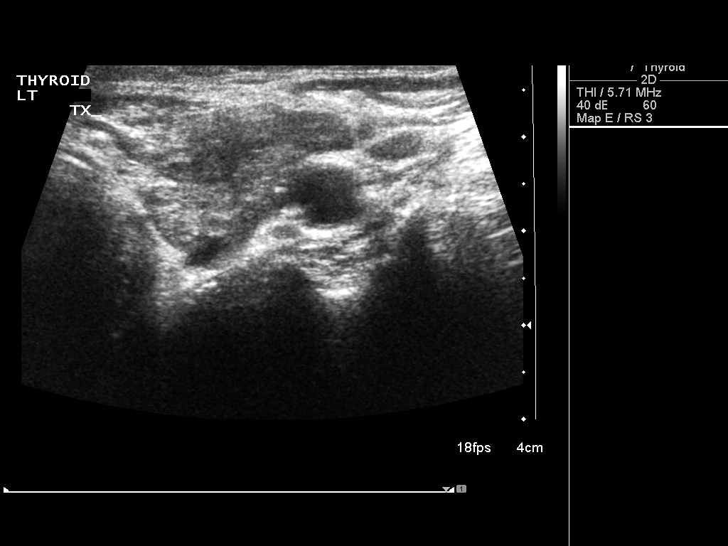

[14 of 15 positions shown; findings below may reference images not displayed]

Thyroid biopsy was thoroughly discussed with the patient and
questions were answered.  The benefits, risks, alternatives, and
complications were also discussed.  The patient understands and
wishes to proceed with the procedure.  Written consent was
obtained.

Ultrasound was performed to localize and mark an adequate site for
the biopsy.  The patient was then prepped and draped in a normal
sterile fashion.  Local anesthesia was provided with 1% lidocaine.
Using direct ultrasound guidance, 5 passes were made using 18 and
25 gauge needles into the nodule within the left lobe of the
thyroid.  Ultrasound was used to confirm needle placements on all
occasions.  Specimens were sent to Pathology for analysis.

Complications:  No immediate
FINDINGS: Imaging confirms needle placement in the dominant left
complex cystic nodule for cyst aspiration and FNA biopsy
IMPRESSION: Ultrasound guided needle aspirate biopsy performed of the dominant
left complex cystic thyroid nodule.

## 2014-07-29 DIAGNOSIS — M545 Low back pain, unspecified: Secondary | ICD-10-CM | POA: Diagnosis not present

## 2014-08-27 DIAGNOSIS — M545 Low back pain, unspecified: Secondary | ICD-10-CM | POA: Diagnosis not present

## 2014-08-27 DIAGNOSIS — M5126 Other intervertebral disc displacement, lumbar region: Secondary | ICD-10-CM | POA: Diagnosis not present

## 2014-08-27 DIAGNOSIS — M4310 Spondylolisthesis, site unspecified: Secondary | ICD-10-CM | POA: Diagnosis not present

## 2014-08-27 DIAGNOSIS — Z8739 Personal history of other diseases of the musculoskeletal system and connective tissue: Secondary | ICD-10-CM | POA: Diagnosis not present

## 2014-09-10 DIAGNOSIS — Z23 Encounter for immunization: Secondary | ICD-10-CM | POA: Diagnosis not present

## 2014-10-01 DIAGNOSIS — M5126 Other intervertebral disc displacement, lumbar region: Secondary | ICD-10-CM | POA: Diagnosis not present

## 2014-10-01 DIAGNOSIS — M4306 Spondylolysis, lumbar region: Secondary | ICD-10-CM | POA: Diagnosis not present

## 2014-10-01 DIAGNOSIS — M5416 Radiculopathy, lumbar region: Secondary | ICD-10-CM | POA: Diagnosis not present

## 2014-10-30 DIAGNOSIS — M4306 Spondylolysis, lumbar region: Secondary | ICD-10-CM | POA: Diagnosis not present

## 2014-10-30 DIAGNOSIS — M4606 Spinal enthesopathy, lumbar region: Secondary | ICD-10-CM | POA: Diagnosis not present

## 2014-10-30 DIAGNOSIS — M2141 Flat foot [pes planus] (acquired), right foot: Secondary | ICD-10-CM | POA: Diagnosis not present

## 2014-10-30 DIAGNOSIS — M19079 Primary osteoarthritis, unspecified ankle and foot: Secondary | ICD-10-CM | POA: Diagnosis not present

## 2014-10-30 DIAGNOSIS — M5126 Other intervertebral disc displacement, lumbar region: Secondary | ICD-10-CM | POA: Diagnosis not present

## 2014-10-30 DIAGNOSIS — M4120 Other idiopathic scoliosis, site unspecified: Secondary | ICD-10-CM | POA: Diagnosis not present

## 2014-11-27 DIAGNOSIS — Z8739 Personal history of other diseases of the musculoskeletal system and connective tissue: Secondary | ICD-10-CM | POA: Diagnosis not present

## 2014-11-28 DIAGNOSIS — M5136 Other intervertebral disc degeneration, lumbar region: Secondary | ICD-10-CM | POA: Insufficient documentation

## 2014-11-28 DIAGNOSIS — Z6826 Body mass index (BMI) 26.0-26.9, adult: Secondary | ICD-10-CM | POA: Diagnosis not present

## 2014-11-28 DIAGNOSIS — E785 Hyperlipidemia, unspecified: Secondary | ICD-10-CM | POA: Diagnosis not present

## 2014-11-28 DIAGNOSIS — M4806 Spinal stenosis, lumbar region: Secondary | ICD-10-CM | POA: Diagnosis not present

## 2014-11-28 DIAGNOSIS — E559 Vitamin D deficiency, unspecified: Secondary | ICD-10-CM | POA: Diagnosis not present

## 2014-11-28 DIAGNOSIS — I10 Essential (primary) hypertension: Secondary | ICD-10-CM | POA: Diagnosis not present

## 2014-11-28 DIAGNOSIS — Z01818 Encounter for other preprocedural examination: Secondary | ICD-10-CM | POA: Diagnosis not present

## 2014-11-28 DIAGNOSIS — E039 Hypothyroidism, unspecified: Secondary | ICD-10-CM | POA: Diagnosis not present

## 2014-12-05 DIAGNOSIS — M5136 Other intervertebral disc degeneration, lumbar region: Secondary | ICD-10-CM | POA: Diagnosis not present

## 2014-12-05 DIAGNOSIS — T380X5A Adverse effect of glucocorticoids and synthetic analogues, initial encounter: Secondary | ICD-10-CM | POA: Diagnosis not present

## 2014-12-05 DIAGNOSIS — M4186 Other forms of scoliosis, lumbar region: Secondary | ICD-10-CM | POA: Diagnosis not present

## 2014-12-05 DIAGNOSIS — E039 Hypothyroidism, unspecified: Secondary | ICD-10-CM | POA: Diagnosis not present

## 2014-12-05 DIAGNOSIS — E785 Hyperlipidemia, unspecified: Secondary | ICD-10-CM | POA: Diagnosis not present

## 2014-12-05 DIAGNOSIS — I10 Essential (primary) hypertension: Secondary | ICD-10-CM | POA: Diagnosis not present

## 2014-12-05 DIAGNOSIS — Z96653 Presence of artificial knee joint, bilateral: Secondary | ICD-10-CM | POA: Diagnosis present

## 2014-12-05 DIAGNOSIS — M4316 Spondylolisthesis, lumbar region: Secondary | ICD-10-CM | POA: Diagnosis present

## 2014-12-05 DIAGNOSIS — M4716 Other spondylosis with myelopathy, lumbar region: Secondary | ICD-10-CM | POA: Diagnosis not present

## 2014-12-05 DIAGNOSIS — R001 Bradycardia, unspecified: Secondary | ICD-10-CM | POA: Diagnosis not present

## 2014-12-05 DIAGNOSIS — M4180 Other forms of scoliosis, site unspecified: Secondary | ICD-10-CM | POA: Diagnosis present

## 2014-12-05 DIAGNOSIS — R739 Hyperglycemia, unspecified: Secondary | ICD-10-CM | POA: Diagnosis not present

## 2014-12-05 DIAGNOSIS — Z79899 Other long term (current) drug therapy: Secondary | ICD-10-CM | POA: Diagnosis not present

## 2014-12-05 DIAGNOSIS — Z7982 Long term (current) use of aspirin: Secondary | ICD-10-CM | POA: Diagnosis not present

## 2014-12-05 DIAGNOSIS — M4806 Spinal stenosis, lumbar region: Secondary | ICD-10-CM | POA: Diagnosis not present

## 2014-12-05 DIAGNOSIS — Z87891 Personal history of nicotine dependence: Secondary | ICD-10-CM | POA: Diagnosis not present

## 2014-12-09 DIAGNOSIS — M47817 Spondylosis without myelopathy or radiculopathy, lumbosacral region: Secondary | ICD-10-CM | POA: Diagnosis not present

## 2014-12-09 DIAGNOSIS — K5900 Constipation, unspecified: Secondary | ICD-10-CM | POA: Diagnosis not present

## 2014-12-09 DIAGNOSIS — M961 Postlaminectomy syndrome, not elsewhere classified: Secondary | ICD-10-CM | POA: Diagnosis not present

## 2014-12-09 DIAGNOSIS — E785 Hyperlipidemia, unspecified: Secondary | ICD-10-CM | POA: Diagnosis not present

## 2014-12-09 DIAGNOSIS — Z79899 Other long term (current) drug therapy: Secondary | ICD-10-CM | POA: Diagnosis not present

## 2014-12-09 DIAGNOSIS — I10 Essential (primary) hypertension: Secondary | ICD-10-CM | POA: Diagnosis not present

## 2014-12-09 DIAGNOSIS — Z981 Arthrodesis status: Secondary | ICD-10-CM | POA: Diagnosis not present

## 2014-12-09 DIAGNOSIS — E039 Hypothyroidism, unspecified: Secondary | ICD-10-CM | POA: Diagnosis not present

## 2014-12-09 DIAGNOSIS — M5416 Radiculopathy, lumbar region: Secondary | ICD-10-CM | POA: Diagnosis not present

## 2014-12-10 DIAGNOSIS — M961 Postlaminectomy syndrome, not elsewhere classified: Secondary | ICD-10-CM | POA: Diagnosis not present

## 2014-12-10 DIAGNOSIS — K5900 Constipation, unspecified: Secondary | ICD-10-CM | POA: Diagnosis not present

## 2014-12-10 DIAGNOSIS — E039 Hypothyroidism, unspecified: Secondary | ICD-10-CM | POA: Diagnosis not present

## 2014-12-10 DIAGNOSIS — E785 Hyperlipidemia, unspecified: Secondary | ICD-10-CM | POA: Diagnosis not present

## 2014-12-10 DIAGNOSIS — M5416 Radiculopathy, lumbar region: Secondary | ICD-10-CM | POA: Diagnosis not present

## 2014-12-10 DIAGNOSIS — I10 Essential (primary) hypertension: Secondary | ICD-10-CM | POA: Diagnosis not present

## 2014-12-11 DIAGNOSIS — M961 Postlaminectomy syndrome, not elsewhere classified: Secondary | ICD-10-CM | POA: Diagnosis not present

## 2014-12-11 DIAGNOSIS — M5416 Radiculopathy, lumbar region: Secondary | ICD-10-CM | POA: Diagnosis not present

## 2014-12-11 DIAGNOSIS — K5900 Constipation, unspecified: Secondary | ICD-10-CM | POA: Diagnosis not present

## 2014-12-11 DIAGNOSIS — I10 Essential (primary) hypertension: Secondary | ICD-10-CM | POA: Diagnosis not present

## 2014-12-11 DIAGNOSIS — E785 Hyperlipidemia, unspecified: Secondary | ICD-10-CM | POA: Diagnosis not present

## 2014-12-11 DIAGNOSIS — E039 Hypothyroidism, unspecified: Secondary | ICD-10-CM | POA: Diagnosis not present

## 2014-12-24 DIAGNOSIS — M5126 Other intervertebral disc displacement, lumbar region: Secondary | ICD-10-CM | POA: Diagnosis not present

## 2015-01-07 DIAGNOSIS — E785 Hyperlipidemia, unspecified: Secondary | ICD-10-CM | POA: Diagnosis not present

## 2015-01-07 DIAGNOSIS — E041 Nontoxic single thyroid nodule: Secondary | ICD-10-CM | POA: Diagnosis not present

## 2015-01-07 DIAGNOSIS — I10 Essential (primary) hypertension: Secondary | ICD-10-CM | POA: Diagnosis not present

## 2015-01-07 DIAGNOSIS — Z0000 Encounter for general adult medical examination without abnormal findings: Secondary | ICD-10-CM | POA: Diagnosis not present

## 2015-01-07 DIAGNOSIS — E559 Vitamin D deficiency, unspecified: Secondary | ICD-10-CM | POA: Diagnosis not present

## 2015-01-15 DIAGNOSIS — R312 Other microscopic hematuria: Secondary | ICD-10-CM | POA: Diagnosis not present

## 2015-01-15 DIAGNOSIS — I10 Essential (primary) hypertension: Secondary | ICD-10-CM | POA: Diagnosis not present

## 2015-01-15 DIAGNOSIS — Z1389 Encounter for screening for other disorder: Secondary | ICD-10-CM | POA: Diagnosis not present

## 2015-01-15 DIAGNOSIS — Z6825 Body mass index (BMI) 25.0-25.9, adult: Secondary | ICD-10-CM | POA: Diagnosis not present

## 2015-01-15 DIAGNOSIS — E785 Hyperlipidemia, unspecified: Secondary | ICD-10-CM | POA: Diagnosis not present

## 2015-01-15 DIAGNOSIS — M25512 Pain in left shoulder: Secondary | ICD-10-CM | POA: Diagnosis not present

## 2015-01-15 DIAGNOSIS — E559 Vitamin D deficiency, unspecified: Secondary | ICD-10-CM | POA: Diagnosis not present

## 2015-01-15 DIAGNOSIS — M5136 Other intervertebral disc degeneration, lumbar region: Secondary | ICD-10-CM | POA: Diagnosis not present

## 2015-01-15 DIAGNOSIS — E039 Hypothyroidism, unspecified: Secondary | ICD-10-CM | POA: Diagnosis not present

## 2015-01-15 DIAGNOSIS — Z0000 Encounter for general adult medical examination without abnormal findings: Secondary | ICD-10-CM | POA: Diagnosis not present

## 2015-01-20 DIAGNOSIS — M4306 Spondylolysis, lumbar region: Secondary | ICD-10-CM | POA: Diagnosis not present

## 2015-01-21 DIAGNOSIS — R312 Other microscopic hematuria: Secondary | ICD-10-CM | POA: Diagnosis not present

## 2015-01-21 DIAGNOSIS — N281 Cyst of kidney, acquired: Secondary | ICD-10-CM | POA: Diagnosis not present

## 2015-02-12 DIAGNOSIS — Z1212 Encounter for screening for malignant neoplasm of rectum: Secondary | ICD-10-CM | POA: Diagnosis not present

## 2015-02-17 DIAGNOSIS — H26492 Other secondary cataract, left eye: Secondary | ICD-10-CM | POA: Diagnosis not present

## 2015-02-17 DIAGNOSIS — H43813 Vitreous degeneration, bilateral: Secondary | ICD-10-CM | POA: Diagnosis not present

## 2015-02-17 DIAGNOSIS — H01001 Unspecified blepharitis right upper eyelid: Secondary | ICD-10-CM | POA: Diagnosis not present

## 2015-02-17 DIAGNOSIS — Z961 Presence of intraocular lens: Secondary | ICD-10-CM | POA: Diagnosis not present

## 2015-02-19 DIAGNOSIS — M5136 Other intervertebral disc degeneration, lumbar region: Secondary | ICD-10-CM | POA: Diagnosis not present

## 2015-02-19 DIAGNOSIS — Z4789 Encounter for other orthopedic aftercare: Secondary | ICD-10-CM | POA: Diagnosis not present

## 2015-02-21 DIAGNOSIS — M7542 Impingement syndrome of left shoulder: Secondary | ICD-10-CM | POA: Diagnosis not present

## 2015-02-24 DIAGNOSIS — L821 Other seborrheic keratosis: Secondary | ICD-10-CM | POA: Diagnosis not present

## 2015-02-24 DIAGNOSIS — D1801 Hemangioma of skin and subcutaneous tissue: Secondary | ICD-10-CM | POA: Diagnosis not present

## 2015-02-24 DIAGNOSIS — D2272 Melanocytic nevi of left lower limb, including hip: Secondary | ICD-10-CM | POA: Diagnosis not present

## 2015-02-24 DIAGNOSIS — L814 Other melanin hyperpigmentation: Secondary | ICD-10-CM | POA: Diagnosis not present

## 2015-02-24 DIAGNOSIS — D225 Melanocytic nevi of trunk: Secondary | ICD-10-CM | POA: Diagnosis not present

## 2015-02-25 ENCOUNTER — Other Ambulatory Visit: Payer: Self-pay | Admitting: Obstetrics and Gynecology

## 2015-02-25 DIAGNOSIS — Z01419 Encounter for gynecological examination (general) (routine) without abnormal findings: Secondary | ICD-10-CM | POA: Diagnosis not present

## 2015-02-25 DIAGNOSIS — Z124 Encounter for screening for malignant neoplasm of cervix: Secondary | ICD-10-CM | POA: Diagnosis not present

## 2015-02-26 LAB — CYTOLOGY - PAP

## 2015-03-24 DIAGNOSIS — M7502 Adhesive capsulitis of left shoulder: Secondary | ICD-10-CM | POA: Diagnosis not present

## 2015-03-28 DIAGNOSIS — M7502 Adhesive capsulitis of left shoulder: Secondary | ICD-10-CM | POA: Diagnosis not present

## 2015-04-03 DIAGNOSIS — M47816 Spondylosis without myelopathy or radiculopathy, lumbar region: Secondary | ICD-10-CM | POA: Diagnosis not present

## 2015-04-04 DIAGNOSIS — M7502 Adhesive capsulitis of left shoulder: Secondary | ICD-10-CM | POA: Diagnosis not present

## 2015-04-07 DIAGNOSIS — M7502 Adhesive capsulitis of left shoulder: Secondary | ICD-10-CM | POA: Diagnosis not present

## 2015-04-09 ENCOUNTER — Other Ambulatory Visit: Payer: Self-pay

## 2015-04-09 DIAGNOSIS — Z1231 Encounter for screening mammogram for malignant neoplasm of breast: Secondary | ICD-10-CM

## 2015-04-11 DIAGNOSIS — M7502 Adhesive capsulitis of left shoulder: Secondary | ICD-10-CM | POA: Diagnosis not present

## 2015-04-14 DIAGNOSIS — M7502 Adhesive capsulitis of left shoulder: Secondary | ICD-10-CM | POA: Diagnosis not present

## 2015-04-21 DIAGNOSIS — M7502 Adhesive capsulitis of left shoulder: Secondary | ICD-10-CM | POA: Diagnosis not present

## 2015-04-25 DIAGNOSIS — M7502 Adhesive capsulitis of left shoulder: Secondary | ICD-10-CM | POA: Diagnosis not present

## 2015-05-20 DIAGNOSIS — Z8739 Personal history of other diseases of the musculoskeletal system and connective tissue: Secondary | ICD-10-CM | POA: Diagnosis not present

## 2015-05-28 ENCOUNTER — Ambulatory Visit
Admission: RE | Admit: 2015-05-28 | Discharge: 2015-05-28 | Disposition: A | Discharge status: Home / Self Care | Payer: Medicare Other | Source: Ambulatory Visit

## 2015-05-28 DIAGNOSIS — Z1231 Encounter for screening mammogram for malignant neoplasm of breast: Secondary | ICD-10-CM | POA: Diagnosis not present

## 2015-07-03 ENCOUNTER — Encounter: Payer: Self-pay | Admitting: Gastroenterology

## 2015-08-25 DIAGNOSIS — J029 Acute pharyngitis, unspecified: Secondary | ICD-10-CM | POA: Diagnosis not present

## 2015-08-28 DIAGNOSIS — J069 Acute upper respiratory infection, unspecified: Secondary | ICD-10-CM | POA: Diagnosis not present

## 2015-09-08 DIAGNOSIS — Z23 Encounter for immunization: Secondary | ICD-10-CM | POA: Diagnosis not present

## 2015-09-09 DIAGNOSIS — Z23 Encounter for immunization: Secondary | ICD-10-CM | POA: Diagnosis not present

## 2015-10-08 DIAGNOSIS — M542 Cervicalgia: Secondary | ICD-10-CM | POA: Diagnosis not present

## 2015-10-15 DIAGNOSIS — M542 Cervicalgia: Secondary | ICD-10-CM | POA: Diagnosis not present

## 2015-10-18 ENCOUNTER — Other Ambulatory Visit: Payer: Self-pay | Admitting: Specialist

## 2015-10-18 DIAGNOSIS — G919 Hydrocephalus, unspecified: Secondary | ICD-10-CM

## 2015-10-24 ENCOUNTER — Ambulatory Visit
Admission: RE | Admit: 2015-10-24 | Discharge: 2015-10-24 | Disposition: A | Discharge status: Home / Self Care | Payer: Medicare Other | Source: Ambulatory Visit | Attending: Specialist | Admitting: Specialist

## 2015-10-24 DIAGNOSIS — H70002 Acute mastoiditis without complications, left ear: Secondary | ICD-10-CM | POA: Diagnosis not present

## 2015-10-24 DIAGNOSIS — G919 Hydrocephalus, unspecified: Secondary | ICD-10-CM

## 2016-01-13 DIAGNOSIS — M4306 Spondylolysis, lumbar region: Secondary | ICD-10-CM | POA: Diagnosis not present

## 2016-02-04 DIAGNOSIS — W0110XA Fall on same level from slipping, tripping and stumbling with subsequent striking against unspecified object, initial encounter: Secondary | ICD-10-CM | POA: Diagnosis not present

## 2016-02-04 DIAGNOSIS — S0180XA Unspecified open wound of other part of head, initial encounter: Secondary | ICD-10-CM | POA: Diagnosis not present

## 2016-02-09 DIAGNOSIS — Z4802 Encounter for removal of sutures: Secondary | ICD-10-CM | POA: Diagnosis not present

## 2016-02-09 DIAGNOSIS — S0180XD Unspecified open wound of other part of head, subsequent encounter: Secondary | ICD-10-CM | POA: Diagnosis not present

## 2016-02-23 DIAGNOSIS — H04123 Dry eye syndrome of bilateral lacrimal glands: Secondary | ICD-10-CM | POA: Diagnosis not present

## 2016-02-23 DIAGNOSIS — H43813 Vitreous degeneration, bilateral: Secondary | ICD-10-CM | POA: Diagnosis not present

## 2016-02-23 DIAGNOSIS — H26492 Other secondary cataract, left eye: Secondary | ICD-10-CM | POA: Diagnosis not present

## 2016-02-23 DIAGNOSIS — H02052 Trichiasis without entropian right lower eyelid: Secondary | ICD-10-CM | POA: Diagnosis not present

## 2016-03-01 DIAGNOSIS — L821 Other seborrheic keratosis: Secondary | ICD-10-CM | POA: Diagnosis not present

## 2016-03-01 DIAGNOSIS — D1801 Hemangioma of skin and subcutaneous tissue: Secondary | ICD-10-CM | POA: Diagnosis not present

## 2016-03-01 DIAGNOSIS — D225 Melanocytic nevi of trunk: Secondary | ICD-10-CM | POA: Diagnosis not present

## 2016-03-01 DIAGNOSIS — L814 Other melanin hyperpigmentation: Secondary | ICD-10-CM | POA: Diagnosis not present

## 2016-03-01 DIAGNOSIS — D2239 Melanocytic nevi of other parts of face: Secondary | ICD-10-CM | POA: Diagnosis not present

## 2016-03-01 DIAGNOSIS — L738 Other specified follicular disorders: Secondary | ICD-10-CM | POA: Diagnosis not present

## 2016-03-03 DIAGNOSIS — E038 Other specified hypothyroidism: Secondary | ICD-10-CM | POA: Diagnosis not present

## 2016-03-03 DIAGNOSIS — Z0000 Encounter for general adult medical examination without abnormal findings: Secondary | ICD-10-CM | POA: Insufficient documentation

## 2016-03-03 DIAGNOSIS — N390 Urinary tract infection, site not specified: Secondary | ICD-10-CM | POA: Diagnosis not present

## 2016-03-03 DIAGNOSIS — I10 Essential (primary) hypertension: Secondary | ICD-10-CM | POA: Diagnosis not present

## 2016-03-03 DIAGNOSIS — E784 Other hyperlipidemia: Secondary | ICD-10-CM | POA: Diagnosis not present

## 2016-03-03 DIAGNOSIS — R8299 Other abnormal findings in urine: Secondary | ICD-10-CM | POA: Diagnosis not present

## 2016-03-03 DIAGNOSIS — E559 Vitamin D deficiency, unspecified: Secondary | ICD-10-CM | POA: Diagnosis not present

## 2016-03-09 DIAGNOSIS — R635 Abnormal weight gain: Secondary | ICD-10-CM | POA: Insufficient documentation

## 2016-03-09 DIAGNOSIS — E038 Other specified hypothyroidism: Secondary | ICD-10-CM | POA: Diagnosis not present

## 2016-03-09 DIAGNOSIS — M1990 Unspecified osteoarthritis, unspecified site: Secondary | ICD-10-CM | POA: Diagnosis not present

## 2016-03-09 DIAGNOSIS — Z1389 Encounter for screening for other disorder: Secondary | ICD-10-CM | POA: Diagnosis not present

## 2016-03-09 DIAGNOSIS — Z6827 Body mass index (BMI) 27.0-27.9, adult: Secondary | ICD-10-CM | POA: Diagnosis not present

## 2016-03-09 DIAGNOSIS — E784 Other hyperlipidemia: Secondary | ICD-10-CM | POA: Diagnosis not present

## 2016-03-09 DIAGNOSIS — I10 Essential (primary) hypertension: Secondary | ICD-10-CM | POA: Diagnosis not present

## 2016-03-09 DIAGNOSIS — Z0000 Encounter for general adult medical examination without abnormal findings: Secondary | ICD-10-CM | POA: Diagnosis not present

## 2016-03-09 DIAGNOSIS — R3129 Other microscopic hematuria: Secondary | ICD-10-CM | POA: Diagnosis not present

## 2016-03-09 DIAGNOSIS — E559 Vitamin D deficiency, unspecified: Secondary | ICD-10-CM | POA: Diagnosis not present

## 2016-03-10 DIAGNOSIS — Z1212 Encounter for screening for malignant neoplasm of rectum: Secondary | ICD-10-CM | POA: Diagnosis not present

## 2016-03-29 DIAGNOSIS — M5137 Other intervertebral disc degeneration, lumbosacral region: Secondary | ICD-10-CM | POA: Diagnosis not present

## 2016-03-29 DIAGNOSIS — M4186 Other forms of scoliosis, lumbar region: Secondary | ICD-10-CM | POA: Diagnosis not present

## 2016-03-29 DIAGNOSIS — M961 Postlaminectomy syndrome, not elsewhere classified: Secondary | ICD-10-CM | POA: Diagnosis not present

## 2016-04-03 DIAGNOSIS — M4186 Other forms of scoliosis, lumbar region: Secondary | ICD-10-CM | POA: Diagnosis not present

## 2016-04-07 DIAGNOSIS — M5137 Other intervertebral disc degeneration, lumbosacral region: Secondary | ICD-10-CM | POA: Diagnosis not present

## 2016-04-07 DIAGNOSIS — M7061 Trochanteric bursitis, right hip: Secondary | ICD-10-CM | POA: Diagnosis not present

## 2016-04-26 ENCOUNTER — Other Ambulatory Visit: Payer: Self-pay

## 2016-04-26 DIAGNOSIS — Z1231 Encounter for screening mammogram for malignant neoplasm of breast: Secondary | ICD-10-CM

## 2016-06-10 ENCOUNTER — Ambulatory Visit
Admission: RE | Admit: 2016-06-10 | Discharge: 2016-06-10 | Disposition: A | Discharge status: Home / Self Care | Payer: Medicare Other | Source: Ambulatory Visit

## 2016-06-10 DIAGNOSIS — Z1231 Encounter for screening mammogram for malignant neoplasm of breast: Secondary | ICD-10-CM | POA: Diagnosis not present

## 2016-07-21 DIAGNOSIS — M25531 Pain in right wrist: Secondary | ICD-10-CM | POA: Diagnosis not present

## 2016-08-21 DIAGNOSIS — Z23 Encounter for immunization: Secondary | ICD-10-CM | POA: Diagnosis not present

## 2017-02-05 DIAGNOSIS — J209 Acute bronchitis, unspecified: Secondary | ICD-10-CM | POA: Diagnosis not present

## 2017-02-28 DIAGNOSIS — H26492 Other secondary cataract, left eye: Secondary | ICD-10-CM | POA: Diagnosis not present

## 2017-02-28 DIAGNOSIS — H43813 Vitreous degeneration, bilateral: Secondary | ICD-10-CM | POA: Diagnosis not present

## 2017-02-28 DIAGNOSIS — H04123 Dry eye syndrome of bilateral lacrimal glands: Secondary | ICD-10-CM | POA: Diagnosis not present

## 2017-02-28 DIAGNOSIS — H01001 Unspecified blepharitis right upper eyelid: Secondary | ICD-10-CM | POA: Diagnosis not present

## 2017-03-07 DIAGNOSIS — D225 Melanocytic nevi of trunk: Secondary | ICD-10-CM | POA: Diagnosis not present

## 2017-03-07 DIAGNOSIS — L720 Epidermal cyst: Secondary | ICD-10-CM | POA: Diagnosis not present

## 2017-03-07 DIAGNOSIS — L821 Other seborrheic keratosis: Secondary | ICD-10-CM | POA: Diagnosis not present

## 2017-03-07 DIAGNOSIS — L814 Other melanin hyperpigmentation: Secondary | ICD-10-CM | POA: Diagnosis not present

## 2017-03-07 DIAGNOSIS — L430 Hypertrophic lichen planus: Secondary | ICD-10-CM | POA: Diagnosis not present

## 2017-03-07 DIAGNOSIS — D485 Neoplasm of uncertain behavior of skin: Secondary | ICD-10-CM | POA: Diagnosis not present

## 2017-03-08 DIAGNOSIS — I10 Essential (primary) hypertension: Secondary | ICD-10-CM | POA: Diagnosis not present

## 2017-03-08 DIAGNOSIS — R8299 Other abnormal findings in urine: Secondary | ICD-10-CM | POA: Diagnosis not present

## 2017-03-08 DIAGNOSIS — E559 Vitamin D deficiency, unspecified: Secondary | ICD-10-CM | POA: Diagnosis not present

## 2017-03-08 DIAGNOSIS — E038 Other specified hypothyroidism: Secondary | ICD-10-CM | POA: Diagnosis not present

## 2017-03-08 DIAGNOSIS — E784 Other hyperlipidemia: Secondary | ICD-10-CM | POA: Diagnosis not present

## 2017-03-15 DIAGNOSIS — F4321 Adjustment disorder with depressed mood: Secondary | ICD-10-CM | POA: Diagnosis not present

## 2017-03-15 DIAGNOSIS — R3121 Asymptomatic microscopic hematuria: Secondary | ICD-10-CM | POA: Diagnosis not present

## 2017-03-15 DIAGNOSIS — I10 Essential (primary) hypertension: Secondary | ICD-10-CM | POA: Diagnosis not present

## 2017-03-15 DIAGNOSIS — E038 Other specified hypothyroidism: Secondary | ICD-10-CM | POA: Diagnosis not present

## 2017-03-15 DIAGNOSIS — Z6826 Body mass index (BMI) 26.0-26.9, adult: Secondary | ICD-10-CM | POA: Diagnosis not present

## 2017-03-15 DIAGNOSIS — E784 Other hyperlipidemia: Secondary | ICD-10-CM | POA: Diagnosis not present

## 2017-03-15 DIAGNOSIS — E559 Vitamin D deficiency, unspecified: Secondary | ICD-10-CM | POA: Diagnosis not present

## 2017-03-15 DIAGNOSIS — Z1389 Encounter for screening for other disorder: Secondary | ICD-10-CM | POA: Diagnosis not present

## 2017-03-15 DIAGNOSIS — M1990 Unspecified osteoarthritis, unspecified site: Secondary | ICD-10-CM | POA: Diagnosis not present

## 2017-03-15 DIAGNOSIS — Z0000 Encounter for general adult medical examination without abnormal findings: Secondary | ICD-10-CM | POA: Diagnosis not present

## 2017-03-24 DIAGNOSIS — R3121 Asymptomatic microscopic hematuria: Secondary | ICD-10-CM | POA: Diagnosis not present

## 2017-03-24 DIAGNOSIS — N281 Cyst of kidney, acquired: Secondary | ICD-10-CM | POA: Diagnosis not present

## 2017-04-22 DIAGNOSIS — Z1289 Encounter for screening for malignant neoplasm of other sites: Secondary | ICD-10-CM | POA: Diagnosis not present

## 2017-05-12 ENCOUNTER — Other Ambulatory Visit: Payer: Self-pay | Admitting: Internal Medicine

## 2017-05-12 DIAGNOSIS — Z1231 Encounter for screening mammogram for malignant neoplasm of breast: Secondary | ICD-10-CM

## 2017-06-13 ENCOUNTER — Ambulatory Visit: Payer: Self-pay

## 2017-06-23 ENCOUNTER — Ambulatory Visit
Admission: RE | Admit: 2017-06-23 | Discharge: 2017-06-23 | Disposition: A | Discharge status: Home / Self Care | Payer: Medicare Other | Source: Ambulatory Visit | Attending: Internal Medicine | Admitting: Internal Medicine

## 2017-06-23 DIAGNOSIS — Z1231 Encounter for screening mammogram for malignant neoplasm of breast: Secondary | ICD-10-CM | POA: Diagnosis not present

## 2017-09-24 DIAGNOSIS — Z23 Encounter for immunization: Secondary | ICD-10-CM | POA: Diagnosis not present

## 2017-09-26 DIAGNOSIS — M19072 Primary osteoarthritis, left ankle and foot: Secondary | ICD-10-CM | POA: Diagnosis not present

## 2017-10-07 DIAGNOSIS — H04123 Dry eye syndrome of bilateral lacrimal glands: Secondary | ICD-10-CM | POA: Diagnosis not present

## 2017-10-07 DIAGNOSIS — H01004 Unspecified blepharitis left upper eyelid: Secondary | ICD-10-CM | POA: Diagnosis not present

## 2017-10-07 DIAGNOSIS — H01001 Unspecified blepharitis right upper eyelid: Secondary | ICD-10-CM | POA: Diagnosis not present

## 2018-01-18 ENCOUNTER — Other Ambulatory Visit: Payer: Self-pay | Admitting: Orthopedic Surgery

## 2018-02-21 ENCOUNTER — Other Ambulatory Visit: Payer: Self-pay

## 2018-02-21 ENCOUNTER — Encounter (HOSPITAL_BASED_OUTPATIENT_CLINIC_OR_DEPARTMENT_OTHER): Payer: Self-pay | Admitting: *Deleted

## 2018-02-21 ENCOUNTER — Encounter (HOSPITAL_BASED_OUTPATIENT_CLINIC_OR_DEPARTMENT_OTHER)
Admission: RE | Admit: 2018-02-21 | Discharge: 2018-02-21 | Disposition: A | Discharge status: Home / Self Care | Payer: Medicare Other | Source: Ambulatory Visit | Attending: Orthopedic Surgery | Admitting: Orthopedic Surgery

## 2018-02-21 DIAGNOSIS — Z7982 Long term (current) use of aspirin: Secondary | ICD-10-CM | POA: Diagnosis not present

## 2018-02-21 DIAGNOSIS — E039 Hypothyroidism, unspecified: Secondary | ICD-10-CM | POA: Diagnosis not present

## 2018-02-21 DIAGNOSIS — M6702 Short Achilles tendon (acquired), left ankle: Secondary | ICD-10-CM | POA: Diagnosis not present

## 2018-02-21 DIAGNOSIS — I10 Essential (primary) hypertension: Secondary | ICD-10-CM | POA: Diagnosis not present

## 2018-02-21 DIAGNOSIS — Z87891 Personal history of nicotine dependence: Secondary | ICD-10-CM | POA: Diagnosis not present

## 2018-02-21 DIAGNOSIS — M19072 Primary osteoarthritis, left ankle and foot: Secondary | ICD-10-CM | POA: Diagnosis not present

## 2018-02-21 DIAGNOSIS — Z79899 Other long term (current) drug therapy: Secondary | ICD-10-CM | POA: Diagnosis not present

## 2018-02-21 LAB — BASIC METABOLIC PANEL
Anion gap: 9 (ref 5–15)
BUN: 19 mg/dL (ref 6–20)
CALCIUM: 9.3 mg/dL (ref 8.9–10.3)
CO2: 25 mmol/L (ref 22–32)
CREATININE: 0.68 mg/dL (ref 0.44–1.00)
Chloride: 106 mmol/L (ref 101–111)
GFR calc Af Amer: >60 mL/min (ref >60)
GFR calc non Af Amer: >60 mL/min (ref >60)
GLUCOSE: 120 mg/dL — AB (ref 65–99)
Potassium: 3.9 mmol/L (ref 3.5–5.1)
Sodium: 140 mmol/L (ref 135–145)

## 2018-02-21 NOTE — Progress Notes (Signed)
EKG reviewed by Dr. Linna Caprice, will proceed with surgery as scheduled.

## 2018-02-23 ENCOUNTER — Ambulatory Visit (HOSPITAL_BASED_OUTPATIENT_CLINIC_OR_DEPARTMENT_OTHER): Payer: Medicare Other | Admitting: Anesthesiology

## 2018-02-23 ENCOUNTER — Encounter (HOSPITAL_BASED_OUTPATIENT_CLINIC_OR_DEPARTMENT_OTHER)
Admission: RE | Disposition: A | Discharge status: Home / Self Care | Payer: Self-pay | Source: Ambulatory Visit | Attending: Orthopedic Surgery

## 2018-02-23 ENCOUNTER — Encounter (HOSPITAL_BASED_OUTPATIENT_CLINIC_OR_DEPARTMENT_OTHER): Payer: Self-pay | Admitting: Anesthesiology

## 2018-02-23 ENCOUNTER — Ambulatory Visit (HOSPITAL_BASED_OUTPATIENT_CLINIC_OR_DEPARTMENT_OTHER)
Admission: RE | Admit: 2018-02-23 | Discharge: 2018-02-24 | Disposition: A | Discharge status: Home / Self Care | Payer: Medicare Other | Source: Ambulatory Visit | Attending: Orthopedic Surgery | Admitting: Orthopedic Surgery

## 2018-02-23 ENCOUNTER — Other Ambulatory Visit: Payer: Self-pay

## 2018-02-23 DIAGNOSIS — I10 Essential (primary) hypertension: Secondary | ICD-10-CM | POA: Insufficient documentation

## 2018-02-23 DIAGNOSIS — G8918 Other acute postprocedural pain: Secondary | ICD-10-CM | POA: Diagnosis not present

## 2018-02-23 DIAGNOSIS — Z79899 Other long term (current) drug therapy: Secondary | ICD-10-CM | POA: Diagnosis not present

## 2018-02-23 DIAGNOSIS — M19072 Primary osteoarthritis, left ankle and foot: Secondary | ICD-10-CM | POA: Insufficient documentation

## 2018-02-23 DIAGNOSIS — Z87891 Personal history of nicotine dependence: Secondary | ICD-10-CM | POA: Diagnosis not present

## 2018-02-23 DIAGNOSIS — M6702 Short Achilles tendon (acquired), left ankle: Secondary | ICD-10-CM | POA: Diagnosis not present

## 2018-02-23 DIAGNOSIS — E039 Hypothyroidism, unspecified: Secondary | ICD-10-CM | POA: Diagnosis not present

## 2018-02-23 DIAGNOSIS — Z7982 Long term (current) use of aspirin: Secondary | ICD-10-CM | POA: Insufficient documentation

## 2018-02-23 DIAGNOSIS — M6700 Short Achilles tendon (acquired), unspecified ankle: Secondary | ICD-10-CM | POA: Diagnosis not present

## 2018-02-23 HISTORY — DX: Hypothyroidism, unspecified: E03.9

## 2018-02-23 HISTORY — DX: Unspecified osteoarthritis, unspecified site: M19.90

## 2018-02-23 HISTORY — PX: GASTROC RECESSION EXTREMITY: SHX6262

## 2018-02-23 HISTORY — PX: TARSAL METATARSAL ARTHRODESIS: SHX2481

## 2018-02-23 SURGERY — RECESSION, TENDON, GASTROCNEMIUS
Anesthesia: General | Site: Leg Lower | Laterality: Left

## 2018-02-23 MED ORDER — FENTANYL CITRATE (PF) 100 MCG/2ML IJ SOLN
INTRAMUSCULAR | Status: AC
  Filled 2018-02-23: qty 2

## 2018-02-23 MED ORDER — BUPIVACAINE-EPINEPHRINE (PF) 0.5% -1:200000 IJ SOLN
INTRAMUSCULAR | Status: DC | PRN
  Administered 2018-02-23: 20 mL via PERINEURAL

## 2018-02-23 MED ORDER — ONDANSETRON HCL 4 MG/2ML IJ SOLN
INTRAMUSCULAR | Status: DC | PRN
  Administered 2018-02-23: 4 mg via INTRAVENOUS

## 2018-02-23 MED ORDER — SODIUM CHLORIDE 0.9 % IV SOLN
INTRAVENOUS | Status: DC
  Administered 2018-02-23: 12:00:00 via INTRAVENOUS

## 2018-02-23 MED ORDER — ROPIVACAINE HCL 7.5 MG/ML IJ SOLN
INTRAMUSCULAR | Status: DC | PRN
  Administered 2018-02-23: 20 mL via PERINEURAL

## 2018-02-23 MED ORDER — METHOCARBAMOL 1000 MG/10ML IJ SOLN: 500.0000 mg | Freq: Four times a day (QID) | INTRAVENOUS | Status: DC | PRN

## 2018-02-23 MED ORDER — MIDAZOLAM HCL 2 MG/2ML IJ SOLN
1.0000 mg | INTRAMUSCULAR | Status: DC | PRN
  Administered 2018-02-23: 1 mg via INTRAVENOUS

## 2018-02-23 MED ORDER — CEFAZOLIN SODIUM-DEXTROSE 2-4 GM/100ML-% IV SOLN
INTRAVENOUS | Status: AC
  Filled 2018-02-23: qty 100

## 2018-02-23 MED ORDER — ONDANSETRON HCL 4 MG PO TABS: 4.0000 mg | ORAL_TABLET | Freq: Four times a day (QID) | ORAL | Status: DC | PRN

## 2018-02-23 MED ORDER — METOCLOPRAMIDE HCL 5 MG/ML IJ SOLN: 10.0000 mg | Freq: Once | INTRAMUSCULAR | Status: DC | PRN

## 2018-02-23 MED ORDER — DEXAMETHASONE SODIUM PHOSPHATE 10 MG/ML IJ SOLN
INTRAMUSCULAR | Status: DC | PRN
  Administered 2018-02-23: 10 mg via INTRAVENOUS

## 2018-02-23 MED ORDER — PROPOFOL 10 MG/ML IV BOLUS
INTRAVENOUS | Status: DC | PRN
  Administered 2018-02-23: 150 mg via INTRAVENOUS

## 2018-02-23 MED ORDER — ASPIRIN EC 81 MG PO TBEC: 81.0000 mg | DELAYED_RELEASE_TABLET | Freq: Two times a day (BID) | ORAL | 0 refills | Status: DC

## 2018-02-23 MED ORDER — DOCUSATE SODIUM 100 MG PO CAPS: 100.0000 mg | ORAL_CAPSULE | Freq: Two times a day (BID) | ORAL | 0 refills | Status: DC

## 2018-02-23 MED ORDER — SCOPOLAMINE 1 MG/3DAYS TD PT72: 1.0000 | MEDICATED_PATCH | Freq: Once | TRANSDERMAL | Status: DC | PRN

## 2018-02-23 MED ORDER — METHOCARBAMOL 500 MG PO TABS: 500.0000 mg | ORAL_TABLET | Freq: Four times a day (QID) | ORAL | Status: DC | PRN

## 2018-02-23 MED ORDER — CEFAZOLIN SODIUM-DEXTROSE 2-4 GM/100ML-% IV SOLN
2.0000 g | INTRAVENOUS | Status: AC
  Administered 2018-02-23: 2 g via INTRAVENOUS

## 2018-02-23 MED ORDER — LACTATED RINGERS IV SOLN
INTRAVENOUS | Status: DC
  Administered 2018-02-23 (×2): via INTRAVENOUS

## 2018-02-23 MED ORDER — SENNA 8.6 MG PO TABS
1.0000 | ORAL_TABLET | Freq: Two times a day (BID) | ORAL | Status: DC
  Administered 2018-02-23: 8.6 mg via ORAL
  Filled 2018-02-23: qty 1

## 2018-02-23 MED ORDER — MEPERIDINE HCL 25 MG/ML IJ SOLN: 6.2500 mg | INTRAMUSCULAR | Status: DC | PRN

## 2018-02-23 MED ORDER — HYDROCODONE-ACETAMINOPHEN 5-325 MG PO TABS: 1.0000 | ORAL_TABLET | ORAL | Status: DC | PRN

## 2018-02-23 MED ORDER — FENTANYL CITRATE (PF) 100 MCG/2ML IJ SOLN: 25.0000 ug | INTRAMUSCULAR | Status: DC | PRN

## 2018-02-23 MED ORDER — OXYCODONE HCL 5 MG PO TABS: 5.0000 mg | ORAL_TABLET | Freq: Once | ORAL | Status: DC | PRN

## 2018-02-23 MED ORDER — OXYCODONE HCL 5 MG PO TABS: 5.0000 mg | ORAL_TABLET | ORAL | Status: DC | PRN

## 2018-02-23 MED ORDER — OXYCODONE HCL 5 MG PO TABS: 5.0000 mg | ORAL_TABLET | ORAL | 0 refills | Status: DC | PRN

## 2018-02-23 MED ORDER — 0.9 % SODIUM CHLORIDE (POUR BTL) OPTIME
TOPICAL | Status: DC | PRN
  Administered 2018-02-23: 500 mL

## 2018-02-23 MED ORDER — OXYCODONE HCL 5 MG/5ML PO SOLN: 5.0000 mg | Freq: Once | ORAL | Status: DC | PRN

## 2018-02-23 MED ORDER — SODIUM CHLORIDE 0.9 % IV SOLN: INTRAVENOUS | Status: DC

## 2018-02-23 MED ORDER — FENTANYL CITRATE (PF) 100 MCG/2ML IJ SOLN
50.0000 ug | INTRAMUSCULAR | Status: DC | PRN
  Administered 2018-02-23: 50 ug via INTRAVENOUS

## 2018-02-23 MED ORDER — HYDROMORPHONE HCL 1 MG/ML IJ SOLN: 0.5000 mg | INTRAMUSCULAR | Status: DC | PRN

## 2018-02-23 MED ORDER — DOCUSATE SODIUM 100 MG PO CAPS
100.0000 mg | ORAL_CAPSULE | Freq: Two times a day (BID) | ORAL | Status: DC
  Administered 2018-02-23: 100 mg via ORAL
  Filled 2018-02-23: qty 1

## 2018-02-23 MED ORDER — ONDANSETRON HCL 4 MG/2ML IJ SOLN: 4.0000 mg | Freq: Four times a day (QID) | INTRAMUSCULAR | Status: DC | PRN

## 2018-02-23 MED ORDER — LIDOCAINE 2% (20 MG/ML) 5 ML SYRINGE
INTRAMUSCULAR | Status: DC | PRN
  Administered 2018-02-23: 60 mg via INTRAVENOUS

## 2018-02-23 MED ORDER — SENNA 8.6 MG PO TABS: 2.0000 | ORAL_TABLET | Freq: Two times a day (BID) | ORAL | 0 refills | Status: DC

## 2018-02-23 MED ORDER — CHLORHEXIDINE GLUCONATE 4 % EX LIQD: 60.0000 mL | Freq: Once | CUTANEOUS | Status: DC

## 2018-02-23 MED ORDER — MIDAZOLAM HCL 2 MG/2ML IJ SOLN
INTRAMUSCULAR | Status: AC
  Filled 2018-02-23: qty 2

## 2018-02-23 SURGICAL SUPPLY — 70 items
BANDAGE ESMARK 6X9 LF (GAUZE/BANDAGES/DRESSINGS) IMPLANT
BIT DRILL 2.5X2.75 QC CALB (BIT) ×3 IMPLANT
BIT DRILL 2.9 CANN QC NONSTRL (BIT) ×3 IMPLANT
BLADE AVERAGE 25X9 (BLADE) IMPLANT
BLADE MICRO SAGITTAL (BLADE) ×3 IMPLANT
BLADE OSC/SAG .038X5.5 CUT EDG (BLADE) IMPLANT
BLADE SURG 15 STRL LF DISP TIS (BLADE) ×6 IMPLANT
BLADE SURG 15 STRL SS (BLADE) ×3
BNDG COHESIVE 4X5 TAN STRL (GAUZE/BANDAGES/DRESSINGS) ×3 IMPLANT
BNDG COHESIVE 6X5 TAN STRL LF (GAUZE/BANDAGES/DRESSINGS) ×3 IMPLANT
BNDG ESMARK 6X9 LF (GAUZE/BANDAGES/DRESSINGS)
CHLORAPREP W/TINT 26ML (MISCELLANEOUS) ×3 IMPLANT
COVER BACK TABLE 60X90IN (DRAPES) ×3 IMPLANT
CUFF TOURNIQUET SINGLE 34IN LL (TOURNIQUET CUFF) ×3 IMPLANT
DECANTER SPIKE VIAL GLASS SM (MISCELLANEOUS) IMPLANT
DRAPE EXTREMITY T 121X128X90 (DRAPE) ×3 IMPLANT
DRAPE OEC MINIVIEW 54X84 (DRAPES) ×3 IMPLANT
DRAPE U-SHAPE 47X51 STRL (DRAPES) ×3 IMPLANT
DRSG MEPITEL 4X7.2 (GAUZE/BANDAGES/DRESSINGS) ×3 IMPLANT
DRSG PAD ABDOMINAL 8X10 ST (GAUZE/BANDAGES/DRESSINGS) ×6 IMPLANT
ELECT REM PT RETURN 9FT ADLT (ELECTROSURGICAL) ×3
ELECTRODE REM PT RTRN 9FT ADLT (ELECTROSURGICAL) ×2 IMPLANT
GAUZE SPONGE 4X4 12PLY STRL (GAUZE/BANDAGES/DRESSINGS) ×3 IMPLANT
GLOVE BIO SURGEON STRL SZ8 (GLOVE) ×6 IMPLANT
GLOVE BIOGEL PI IND STRL 7.0 (GLOVE) ×2 IMPLANT
GLOVE BIOGEL PI IND STRL 8 (GLOVE) ×4 IMPLANT
GLOVE BIOGEL PI INDICATOR 7.0 (GLOVE) ×1
GLOVE BIOGEL PI INDICATOR 8 (GLOVE) ×2
GLOVE ECLIPSE 6.5 STRL STRAW (GLOVE) ×6 IMPLANT
GLOVE ECLIPSE 8.0 STRL XLNG CF (GLOVE) ×3 IMPLANT
GOWN STRL REUS W/ TWL LRG LVL3 (GOWN DISPOSABLE) ×4 IMPLANT
GOWN STRL REUS W/ TWL XL LVL3 (GOWN DISPOSABLE) ×4 IMPLANT
GOWN STRL REUS W/TWL LRG LVL3 (GOWN DISPOSABLE) ×2
GOWN STRL REUS W/TWL XL LVL3 (GOWN DISPOSABLE) ×2
K-WIRE ACE 1.6X6 (WIRE) ×6
KIT ARCUS SIZING TEMPLATE STRL (KITS) ×3 IMPLANT
KIT STAPLE ARCUS 16X13 STRL (Staple) ×3 IMPLANT
KIT STAPLE ARCUS 20X17 STRL (Staple) ×3 IMPLANT
KWIRE ACE 1.6X6 (WIRE) ×4 IMPLANT
NEEDLE HYPO 22GX1.5 SAFETY (NEEDLE) IMPLANT
PACK BASIN DAY SURGERY FS (CUSTOM PROCEDURE TRAY) ×3 IMPLANT
PAD CAST 4YDX4 CTTN HI CHSV (CAST SUPPLIES) ×2 IMPLANT
PADDING CAST ABS 4INX4YD NS (CAST SUPPLIES)
PADDING CAST ABS COTTON 4X4 ST (CAST SUPPLIES) IMPLANT
PADDING CAST COTTON 4X4 STRL (CAST SUPPLIES) ×1
PADDING CAST COTTON 6X4 STRL (CAST SUPPLIES) ×3 IMPLANT
PENCIL BUTTON HOLSTER BLD 10FT (ELECTRODE) ×3 IMPLANT
SANITIZER HAND PURELL 535ML FO (MISCELLANEOUS) ×3 IMPLANT
SCREW ACE CAN 4.0 42M (Screw) ×3 IMPLANT
SCREW ACE CAN 4.0 44M (Screw) ×3 IMPLANT
SCREW CORTICAL 3.5MM 38MM (Screw) ×3 IMPLANT
SHEET MEDIUM DRAPE 40X70 STRL (DRAPES) ×3 IMPLANT
SLEEVE SCD COMPRESS KNEE MED (MISCELLANEOUS) ×3 IMPLANT
SPLINT FAST PLASTER 5X30 (CAST SUPPLIES) ×20
SPLINT PLASTER CAST FAST 5X30 (CAST SUPPLIES) ×40 IMPLANT
SPONGE LAP 18X18 RF (DISPOSABLE) ×3 IMPLANT
STAPLE ASSEMBLY ARCUS 20X17 (Staple) ×3 IMPLANT
STOCKINETTE 6  STRL (DRAPES) ×1
STOCKINETTE 6 STRL (DRAPES) ×2 IMPLANT
SUCTION FRAZIER HANDLE 10FR (MISCELLANEOUS) ×1
SUCTION TUBE FRAZIER 10FR DISP (MISCELLANEOUS) ×2 IMPLANT
SUT ETHILON 3 0 PS 1 (SUTURE) ×6 IMPLANT
SUT MNCRL AB 3-0 PS2 18 (SUTURE) ×3 IMPLANT
SUT VIC AB 0 SH 27 (SUTURE) IMPLANT
SUT VIC AB 2-0 SH 27 (SUTURE) ×1
SUT VIC AB 2-0 SH 27XBRD (SUTURE) ×2 IMPLANT
SYR BULB 3OZ (MISCELLANEOUS) ×3 IMPLANT
TOWEL OR 17X24 6PK STRL BLUE (TOWEL DISPOSABLE) ×6 IMPLANT
TUBE CONNECTING 20X1/4 (TUBING) ×3 IMPLANT
UNDERPAD 30X30 (UNDERPADS AND DIAPERS) ×3 IMPLANT

## 2018-02-23 NOTE — Discharge Instructions (Addendum)
Stephanie Simmer, MD Stephanie Baker  Please read the following information regarding your care after surgery.  Medications  You only need a prescription for the narcotic pain medicine (ex. oxycodone, Percocet, Norco).  All of the other medicines listed below are available over the counter. X Meloxicam (that you have at home) pills twice a day for the first 3 days after surgery. X acetominophen (Tylenol) 650 mg every 4-6 hours as you need for minor to moderate pain X oxycodone as prescribed for severe pain  Narcotic pain medicine (ex. oxycodone, Percocet, Vicodin) will cause constipation.  To prevent this problem, take the following medicines while you are taking any pain medicine. X docusate sodium (Colace) 100 mg twice a day X senna (Senokot) 2 tablets twice a day  X To help prevent blood clots, take a baby aspirin (81 mg) twice a day after surgery.  You should also get up every hour while you are awake to move around.    Weight Bearing X Do not bear any weight on the operated leg or foot.  Cast / Splint / Dressing X Keep your splint, cast or dressing clean and dry.  Dont put anything (coat hanger, pencil, etc) down inside of it.  If it gets damp, use a hair dryer on the cool setting to dry it.  If it gets soaked, call the office to schedule an appointment for a cast change.  After your dressing, cast or splint is removed; you may shower, but do not soak or scrub the wound.  Allow the water to run over it, and then gently pat it dry.  Swelling It is normal for you to have swelling where you had surgery.  To reduce swelling and pain, keep your toes above your nose for at least 3 days after surgery.  It may be necessary to keep your foot or leg elevated for several weeks.  If it hurts, it should be elevated.  Follow Up Call my office at [phone] when you are discharged from the hospital or surgery center to schedule an appointment to be seen two weeks after surgery.  Call my  office at [phone] if you develop a fever >101.5 F, nausea, vomiting, bleeding from the surgical site or severe pain.      Post Anesthesia Home Care Instructions  Activity: Get plenty of rest for the remainder of the day. A responsible individual must stay with you for 24 hours following the procedure.  For the next 24 hours, DO NOT: -Drive a car -Paediatric nurse -Drink alcoholic beverages -Take any medication unless instructed by your physician -Make any legal decisions or sign important papers.  Meals: Start with liquid foods such as gelatin or soup. Progress to regular foods as tolerated. Avoid greasy, spicy, heavy foods. If nausea and/or vomiting occur, drink only clear liquids until the nausea and/or vomiting subsides. Call your physician if vomiting continues.  Special Instructions/Symptoms: Your throat may feel dry or sore from the anesthesia or the breathing tube placed in your throat during surgery. If this causes discomfort, gargle with warm salt water. The discomfort should disappear within 24 hours.  If you had a scopolamine patch placed behind your ear for the management of post- operative nausea and/or vomiting:  1. The medication in the patch is effective for 72 hours, after which it should be removed.  Wrap patch in a tissue and discard in the trash. Wash hands thoroughly with soap and water. 2. You may remove the patch earlier than 72 hours if  you experience unpleasant side effects which may include dry mouth, dizziness or visual disturbances. 3. Avoid touching the patch. Wash your hands with soap and water after contact with the patch.     Regional Anesthesia Blocks  1. Numbness or the inability to move the "blocked" extremity may last from 3-48 hours after placement. The length of time depends on the medication injected and your individual response to the medication. If the numbness is not going away after 48 hours, call your surgeon.  2. The extremity that  is blocked will need to be protected until the numbness is gone and the  Strength has returned. Because you cannot feel it, you will need to take extra care to avoid injury. Because it may be weak, you may have difficulty moving it or using it. You may not know what position it is in without looking at it while the block is in effect.  3. For blocks in the legs and feet, returning to weight bearing and walking needs to be done carefully. You will need to wait until the numbness is entirely gone and the strength has returned. You should be able to move your leg and foot normally before you try and bear weight or walk. You will need someone to be with you when you first try to ensure you do not fall and possibly risk injury.  4. Bruising and tenderness at the needle site are common side effects and will resolve in a few days.  5. Persistent numbness or new problems with movement should be communicated to the surgeon or the [place] [phone] [place] [phone]).

## 2018-02-23 NOTE — Progress Notes (Signed)
Assisted Dr. Foster with left, ultrasound guided, popliteal/saphenous block. Side rails up, monitors on throughout procedure. See vital signs in flow sheet. Tolerated Procedure well. 

## 2018-02-23 NOTE — Op Note (Signed)
02/23/2018  10:40 AM  PATIENT:  Stephanie Baker  77 y.o. female  PRE-OPERATIVE DIAGNOSIS: 1.  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  Tight left heel cord  POST-OPERATIVE DIAGNOSIS: Same  Procedure(s): 1.  Left gastrocnemius recession 2.  Left first and second tarsometatarsal joint arthrodeses 3.  Left foot AP, oblique and lateral radiographs  SURGEON:  Wylene Simmer, MD  ASSISTANT: Mechele Claude, PA-C  ANESTHESIA:   General, regional  EBL:  minimal   TOURNIQUET: 76 minutes at 903 mmHg  COMPLICATIONS:  None apparent  DISPOSITION:  Extubated, awake and stable to recovery.  INDICATION FOR PROCEDURE: The patient is a 77 year old female with past medical history significant for hypothyroidism.  She has a long history of left midfoot pain due to this first and second tarsometatarsal joint arthritis.  She also has a tight heel cord.  She has failed nonoperative treatment to date including activity modification, oral anti-inflammatories and shoe wear modifications as well as orthotics.  She presents now for operative treatment of this painful and disabling condition.The risks and benefits of the alternative treatment options have been discussed in detail.  The patient wishes to proceed with surgery and specifically understands risks of bleeding, infection, nerve damage, blood clots, need for additional surgery, amputation and death.  PROCEDURE IN DETAIL:  After pre operative consent was obtained, and the correct operative site was identified, the patient was brought to the operating room and placed supine on the OR table.  Anesthesia was administered.  Pre-operative antibiotics were administered.  A surgical timeout was taken.  The left lower extremity was elevated and the tourniquet inflated to 250 mmHg.  A longitudinal incision was made over the medial calf.  Dissection was carried down through the skin and subcutaneous tissues.  The superficial fascia  was incised.  The gastrocnemius tendon was identified.  It was divided from medial to lateral under direct vision taking care to protect the sural nerve.  The wound was irrigated and closed with Monocryl and nylon.  Attention was then turned to the dorsum of the midfoot.  A longitudinal incision was made between the first and second metatarsal bases.  Dissection was carried down through the skin and subtendinous tissue.  The extensor hallucis longus and brevis tendons were identified and protected.  The neurovascular bundle was identified.  It was mobilized and retracted laterally.  It was protected throughout the case.  The first and second tarsometatarsal joints were opened.  Dorsal osteophytes were removed with a rongeur.  An oscillating saw was used to remove the remaining articular cartilage and subchondral bone from both sides of both joints.  Wound was irrigated copiously.  A 2.5 mm drill bit was used to perforate the bone on both sides of the joint leaving the resultant bone graft in place.  The periosteum was removed from the base of the first metatarsal and base of the second metatarsal as well.  The first and second TMT joints were reduced and K wires were advanced from proximal to distal across the joints.  Appropriate location of the wires was verified on AP and lateral fluoroscopic imaging.  A 4 mm Biomet partially threaded cannulated screws were then inserted over the guidewires.  Both were noted to compress the joint appropriately and had excellent purchase.  A small incision was then made over the medial cuneiform.  Dissection was carried down through the subtenons tissues.  A K wire was inserted from the medial cuneiform across to the  base of the second metatarsal in the homerun position.  A K wire was overdrilled.  The intercuneiform joint was compressed with a tenaculum.  A 3.5 mm fully threaded Biomet small frag screw was inserted and was noted to have excellent purchase.  Both joints were  then further stabilized with Zimmer Biomet staples.  A 20 mm staple was placed across the first TMT joint and a 16 mm staple was placed across the second TMT joint.  Final AP, oblique and lateral radiographs showed appropriate position and length of all hardware in appropriate arthrodeses of the first and second tarsometatarsal joints.  The wounds were then irrigated copiously and closed with Monocryl and nylon.  Sterile dressings were applied followed by a well-padded short leg splint.  The tourniquet was released after application of the dressings.  The patient was awakened from anesthesia and transported to the recovery room in stable condition.  FOLLOW UP PLAN: Nonweightbearing on the left lower extremity.  Aspirin 81 mg p.o. twice daily for DVT prophylaxis.  Follow-up in 2 weeks for suture removal and conversion to a short leg cast.   RADIOGRAPHS: AP, oblique and lateral radiographs of the left foot are obtained intraoperatively.  These show interval arthrodesis of the first and second tarsometatarsal joints.  Appropriate alignment is noted.  No other acute injuries are noted.    Mechele Claude PA-C was present and scrubbed for the duration of the operative case. His assistance assistance was essential in positioning the patient, prepping and draping, gaining maintaining exposure, performing the operation, closing and dressing the wounds and applying the splint.

## 2018-02-23 NOTE — Transfer of Care (Signed)
Immediate Anesthesia Transfer of Care Note  Patient: Stephanie Baker  Procedure(s) Performed: Left Gastroc Recession (Left Leg Lower) Left First and Second Tarsometatarsal Arthrodesis (Left Foot)  Patient Location: PACU  Anesthesia Type:General and Regional  Level of Consciousness: sedated  Airway & Oxygen Therapy: Patient Spontanous Breathing and Patient connected to face mask oxygen  Post-op Assessment: Report given to RN and Post -op Vital signs reviewed and stable  Post vital signs: Reviewed and stable  Last Vitals:  Vitals:   02/23/18 0845 02/23/18 0851  BP: (!) 147/75   Pulse: 60 67  Resp: 16 18  SpO2: 100% 100%    Last Pain: There were no vitals filed for this visit.       Complications: No apparent anesthesia complications

## 2018-02-23 NOTE — Anesthesia Procedure Notes (Addendum)
Anesthesia Regional Block: Popliteal block   Pre-Anesthetic Checklist: ,, timeout performed, Correct Patient, Correct Site, Correct Laterality, Correct Procedure, Correct Position, site marked, Risks and benefits discussed,  Surgical consent,  Pre-op evaluation,  At surgeon's request and post-op pain management  Laterality: Left  Prep: chloraprep       Needles:  Injection technique: Single-shot  Needle Type: Echogenic Stimulator Needle     Needle Length: 9cm  Needle Gauge: 21   Needle insertion depth: 6 cm   Additional Needles:   Procedures:,,,, ultrasound used (permanent image in chart),,,,  Narrative:  Start time: 02/23/2018 8:38 AM End time: 02/23/2018 8:43 AM Injection made incrementally with aspirations every 5 mL.  Performed by: Personally  Anesthesiologist: Josephine Igo, MD  Additional Notes: Timeout performed. Patient sedated. Relevant anatomy ID'd using [place]. Incremental 2-20ml injection of LA with frequent aspiration. Patient tolerated procedure well.        Left Popliteal block

## 2018-02-23 NOTE — Anesthesia Procedure Notes (Signed)
Procedure Name: LMA Insertion Date/Time: 02/23/2018 9:04 AM Performed by: Lieutenant Diego, CRNA Pre-anesthesia Checklist: Patient identified, Emergency Drugs available, Suction available and Patient being monitored Patient Re-evaluated:Patient Re-evaluated prior to induction Oxygen Delivery Method: Circle system utilized Preoxygenation: Pre-oxygenation with 100% oxygen Induction Type: IV induction Ventilation: Mask ventilation without difficulty LMA: LMA inserted LMA Size: 4.0 Number of attempts: 1 Airway Equipment and Method: Bite block Placement Confirmation: positive ETCO2 and breath sounds checked- equal and bilateral Tube secured with: Tape Dental Injury: Teeth and Oropharynx as per pre-operative assessment

## 2018-02-23 NOTE — H&P (Signed)
Stephanie Baker is an 77 y.o. female.   Chief Complaint: left foot pain HPI: The patient is a 77 year old female with past medical history significant for hypothyroidism.  She has a long history of left foot pain.  Her radiographs reveal midfoot arthritis affecting primarily the first and second tarsometatarsal joints.  She also has a tight heel cord with gastrocnemius contracture.  She has failed nonoperative treatment to date including activity modification, orthotics, oral anti-inflammatories and steroid injections.  She presents now for operative treatment of this painful condition.  Past Medical History:  Diagnosis Date  . Arthritis    OA  . Hypertension   . Hypothyroidism     Past Surgical History:  Procedure Laterality Date  . ABDOMINAL HYSTERECTOMY    . APPENDECTOMY    . BACK SURGERY     lumbar  . EYE SURGERY     bilateral cataract extraction with IOL  . HAND SURGERY     left thumb MP  . JOINT REPLACEMENT     right knee  . PARTIAL KNEE ARTHROPLASTY  11/14/2012   Procedure: UNICOMPARTMENTAL KNEE;  Surgeon: Mauri Pole, MD;  Location: [facility];  Service: Orthopedics;  Laterality: Left;  . thumb surgery     left  . VAGINAL HYSTERECTOMY      Family History  Problem Relation Age of Onset  . Colon cancer Neg Hx   . Stomach cancer Neg Hx    Social History:  reports that she quit smoking about 29 years ago. Her smoking use included cigarettes. she has never used smokeless tobacco. She reports that she drinks about 4.2 oz of alcohol per week. She reports that she does not use drugs.  Allergies: No Known Allergies  Medications Prior to Admission  Medication Sig Dispense Refill  . amLODipine (NORVASC) 2.5 MG tablet Take 2.5 mg by mouth daily before breakfast.    . aspirin EC 81 MG tablet Take 81 mg by mouth daily.    . Biotin 5000 MCG CAPS Take 1 capsule by mouth daily.    Marland Kitchen CALCIUM & MAGNESIUM CARBONATES PO Take 1 tablet by mouth at bedtime.    . Cholecalciferol (VITAMIN  D) 2000 UNITS CAPS Take 1 capsule by mouth at bedtime.    . cycloSPORINE (RESTASIS) 0.05 % ophthalmic emulsion Place 1 drop into both eyes 2 (two) times daily.    . Evening Primrose Oil CAPS Take 1 capsule by mouth 2 (two) times daily.    . fish oil-omega-3 fatty acids 1000 MG capsule Take 1 g by mouth 3 (three) times daily.    Marland Kitchen glucosamine-chondroitin 500-400 MG tablet Take 1 tablet by mouth daily.     Marland Kitchen levothyroxine (SYNTHROID, LEVOTHROID) 25 MCG tablet Take 25 mcg by mouth daily before breakfast.    . meloxicam (MOBIC) 7.5 MG tablet Take 7.5 mg by mouth daily as needed.     . Multiple Vitamins-Minerals (ULTRA VITA TIME IRON FREE PO) Take 1 tablet by mouth daily.    Marland Kitchen olmesartan-hydrochlorothiazide (BENICAR HCT) 40-12.5 MG per tablet Take 1 tablet by mouth daily before breakfast.    . potassium chloride SA (K-DUR,KLOR-CON) 20 MEQ tablet Take 20 mEq by mouth daily before breakfast.    . Vitamin D, Ergocalciferol, (DRISDOL) 50000 UNITS CAPS Take 50,000 Units by mouth every 30 (thirty) days. On the first of the month      Results for orders placed or performed during the hospital encounter of 02/23/18 (from the past 48 hour(s))  Basic metabolic panel  Status: Abnormal   Collection Time: 02/21/18  2:00 PM  Result Value Ref Range   Sodium 140 135 - 145 mmol/L   Potassium 3.9 3.5 - 5.1 mmol/L   Chloride 106 101 - 111 mmol/L   CO2 25 22 - 32 mmol/L   Glucose, Bld 120 (H) 65 - 99 mg/dL   BUN 19 6 - 20 mg/dL   Creatinine, Ser 0.68 0.44 - 1.00 mg/dL   Calcium 9.3 8.9 - 10.3 mg/dL   GFR calc non Af Amer >60 >60 mL/min   GFR calc Af Amer >60 >60 mL/min    Comment: (NOTE) The eGFR has been calculated using the CKD EPI equation. This calculation has not been validated in all clinical situations. eGFR's persistently <60 mL/min signify possible Chronic Kidney Disease.    Anion gap 9 5 - 15    Comment: Performed at [facility] [address], [place], [place] [address]   No results  found.  ROS no recent fever, chills, nausea, vomiting or changes in her appetite.  Height _0  (1.651 m), weight 72.1 kg (159 lb). Physical Exam  Well-nourished well-developed woman in no apparent distress.  Alert and oriented x4.  Mood and affect are normal.  Extraocular motions are intact.  Respirations are unlabored.  Gait is slightly antalgic to the left.  Left foot has healthy skin.  No lymphadenopathy.  Pulses are palpable.  5 out of 5 strength in plantar flexion and dorsiflexion of the toes.  Sensibility to light touch is intact on the dorsum of the foot.  Heel cord is tight.  She is tender to palpation across the dorsum of the midfoot  Assessment/Plan  Left midfoot arthritis and tight heel cord -to the operating room today for gastrocnemius recession and first and second tarsometatarsal joint arthrodeses.  The risks and benefits of the alternative treatment options have been discussed in detail.  The patient wishes to proceed with surgery and specifically understands risks of bleeding, infection, nerve damage, blood clots, need for additional surgery, amputation and death.   Wylene Simmer, MD 06-Mar-2018, 8:38 AM

## 2018-02-23 NOTE — Anesthesia Preprocedure Evaluation (Signed)
Anesthesia Evaluation  Patient identified by MRN, date of birth, ID band Patient awake    Reviewed: Allergy & Precautions, NPO status , Patient's Chart, lab work & pertinent test results  Airway Mallampati: II  TM Distance: >3 FB Neck ROM: Full    Dental no notable dental hx. (+) Teeth Intact   Pulmonary former smoker,    Pulmonary exam normal breath sounds clear to auscultation       Cardiovascular hypertension, Pt. on medications Normal cardiovascular exam Rhythm:Regular Rate:Normal     Neuro/Psych negative neurological ROS  negative psych ROS   GI/Hepatic negative GI ROS, Neg liver ROS,   Endo/Other  Hypothyroidism   Renal/GU negative Renal ROS  negative genitourinary   Musculoskeletal  (+) Arthritis , Osteoarthritis,  Left foot arthritis and tight heelcord   Abdominal   Peds  Hematology  (+) anemia ,   Anesthesia Other Findings   Reproductive/Obstetrics                             Anesthesia Physical Anesthesia Plan  ASA: II  Anesthesia Plan: General   Post-op Pain Management:    Induction: Intravenous  PONV Risk Score and Plan: 4 or greater and Ondansetron, Dexamethasone and Treatment may vary due to age or medical condition  Airway Management Planned: LMA and Oral ETT  Additional Equipment:   Intra-op Plan:   Post-operative Plan: Extubation in OR  Informed Consent: I have reviewed the patients History and Physical, chart, labs and discussed the procedure including the risks, benefits and alternatives for the proposed anesthesia with the patient or authorized representative who has indicated his/her understanding and acceptance.   Dental advisory given  Plan Discussed with: CRNA and Anesthesiologist  Anesthesia Plan Comments:         Anesthesia Quick Evaluation

## 2018-02-23 NOTE — Anesthesia Procedure Notes (Addendum)
Anesthesia Regional Block: Adductor canal block   Pre-Anesthetic Checklist: ,, timeout performed, Correct Patient, Correct Site, Correct Laterality, Correct Procedure, Correct Position, site marked, Risks and benefits discussed,  Surgical consent,  Pre-op evaluation,  At surgeon's request and post-op pain management  Laterality: Left  Prep: chloraprep       Needles:  Injection technique: Single-shot  Needle Type: Echogenic Stimulator Needle     Needle Length: 9cm  Needle Gauge: 21   Needle insertion depth: 5 cm   Additional Needles:   Narrative:  Start time: 02/23/2018 8:44 AM End time: 02/23/2018 8:49 AM Injection made incrementally with aspirations every 5 mL.  Performed by: Personally  Anesthesiologist: Josephine Igo, MD  Additional Notes: Timeout performed. Patient sedated. Relevant anatomy ID'd using [place]. Incremental 2-23ml injection of LA with frequent aspiration. Patient tolerated procedure well.        Left Adductor Canal Block

## 2018-02-23 NOTE — Anesthesia Postprocedure Evaluation (Signed)
Anesthesia Post Note  Patient: Stephanie Baker  Procedure(s) Performed: Left Gastroc Recession (Left Leg Lower) Left First and Second Tarsometatarsal Arthrodesis (Left Foot)     Patient location during evaluation: PACU Anesthesia Type: General Level of consciousness: awake and alert and oriented Pain management: pain level controlled Vital Signs Assessment: post-procedure vital signs reviewed and stable Respiratory status: spontaneous breathing, nonlabored ventilation and respiratory function stable Cardiovascular status: blood pressure returned to baseline and stable Postop Assessment: no apparent nausea or vomiting Anesthetic complications: no    Last Vitals:  Vitals:   02/23/18 1050 02/23/18 1100  BP:  (!) 105/57  Pulse: (!) 59 61  Resp: 11 (!) 21  Temp:    SpO2: 99% 96%    Last Pain:  Vitals:   02/23/18 1100  PainSc: 0-No pain    LLE Motor Response: No movement due to regional block (02/23/18 1100) LLE Sensation: No sensation (absent) (02/23/18 1100)          Myria Steenbergen A.

## 2018-02-24 ENCOUNTER — Encounter (HOSPITAL_BASED_OUTPATIENT_CLINIC_OR_DEPARTMENT_OTHER): Payer: Self-pay | Admitting: Orthopedic Surgery

## 2018-03-03 DIAGNOSIS — H0100A Unspecified blepharitis right eye, upper and lower eyelids: Secondary | ICD-10-CM | POA: Diagnosis not present

## 2018-03-03 DIAGNOSIS — H43813 Vitreous degeneration, bilateral: Secondary | ICD-10-CM | POA: Diagnosis not present

## 2018-03-03 DIAGNOSIS — H04123 Dry eye syndrome of bilateral lacrimal glands: Secondary | ICD-10-CM | POA: Diagnosis not present

## 2018-03-03 DIAGNOSIS — H02052 Trichiasis without entropian right lower eyelid: Secondary | ICD-10-CM | POA: Diagnosis not present

## 2018-03-08 DIAGNOSIS — E559 Vitamin D deficiency, unspecified: Secondary | ICD-10-CM | POA: Diagnosis not present

## 2018-03-08 DIAGNOSIS — R82998 Other abnormal findings in urine: Secondary | ICD-10-CM | POA: Diagnosis not present

## 2018-03-08 DIAGNOSIS — E7849 Other hyperlipidemia: Secondary | ICD-10-CM | POA: Diagnosis not present

## 2018-03-08 DIAGNOSIS — I10 Essential (primary) hypertension: Secondary | ICD-10-CM | POA: Diagnosis not present

## 2018-03-08 DIAGNOSIS — E038 Other specified hypothyroidism: Secondary | ICD-10-CM | POA: Diagnosis not present

## 2018-03-09 DIAGNOSIS — Z4789 Encounter for other orthopedic aftercare: Secondary | ICD-10-CM | POA: Diagnosis not present

## 2018-03-09 DIAGNOSIS — M19079 Primary osteoarthritis, unspecified ankle and foot: Secondary | ICD-10-CM | POA: Diagnosis not present

## 2018-03-09 DIAGNOSIS — M19072 Primary osteoarthritis, left ankle and foot: Secondary | ICD-10-CM | POA: Diagnosis not present

## 2018-03-13 DIAGNOSIS — N281 Cyst of kidney, acquired: Secondary | ICD-10-CM | POA: Diagnosis not present

## 2018-03-13 DIAGNOSIS — R3121 Asymptomatic microscopic hematuria: Secondary | ICD-10-CM | POA: Diagnosis not present

## 2018-03-16 DIAGNOSIS — Z1212 Encounter for screening for malignant neoplasm of rectum: Secondary | ICD-10-CM | POA: Diagnosis not present

## 2018-03-20 DIAGNOSIS — E7849 Other hyperlipidemia: Secondary | ICD-10-CM | POA: Diagnosis not present

## 2018-03-20 DIAGNOSIS — Z1389 Encounter for screening for other disorder: Secondary | ICD-10-CM | POA: Diagnosis not present

## 2018-03-20 DIAGNOSIS — M5136 Other intervertebral disc degeneration, lumbar region: Secondary | ICD-10-CM | POA: Diagnosis not present

## 2018-03-20 DIAGNOSIS — E038 Other specified hypothyroidism: Secondary | ICD-10-CM | POA: Diagnosis not present

## 2018-03-20 DIAGNOSIS — I10 Essential (primary) hypertension: Secondary | ICD-10-CM | POA: Diagnosis not present

## 2018-03-20 DIAGNOSIS — Z0000 Encounter for general adult medical examination without abnormal findings: Secondary | ICD-10-CM | POA: Diagnosis not present

## 2018-03-20 DIAGNOSIS — M1990 Unspecified osteoarthritis, unspecified site: Secondary | ICD-10-CM | POA: Diagnosis not present

## 2018-03-20 DIAGNOSIS — E559 Vitamin D deficiency, unspecified: Secondary | ICD-10-CM | POA: Diagnosis not present

## 2018-04-04 ENCOUNTER — Encounter: Payer: Self-pay | Admitting: Gastroenterology

## 2018-04-05 DIAGNOSIS — M79672 Pain in left foot: Secondary | ICD-10-CM | POA: Diagnosis not present

## 2018-05-08 DIAGNOSIS — M19079 Primary osteoarthritis, unspecified ankle and foot: Secondary | ICD-10-CM | POA: Diagnosis not present

## 2018-05-08 DIAGNOSIS — Z4789 Encounter for other orthopedic aftercare: Secondary | ICD-10-CM | POA: Diagnosis not present

## 2018-05-08 DIAGNOSIS — M79672 Pain in left foot: Secondary | ICD-10-CM | POA: Diagnosis not present

## 2018-05-10 DIAGNOSIS — D1801 Hemangioma of skin and subcutaneous tissue: Secondary | ICD-10-CM | POA: Diagnosis not present

## 2018-05-10 DIAGNOSIS — D692 Other nonthrombocytopenic purpura: Secondary | ICD-10-CM | POA: Diagnosis not present

## 2018-05-10 DIAGNOSIS — L814 Other melanin hyperpigmentation: Secondary | ICD-10-CM | POA: Diagnosis not present

## 2018-05-10 DIAGNOSIS — L821 Other seborrheic keratosis: Secondary | ICD-10-CM | POA: Diagnosis not present

## 2018-05-10 DIAGNOSIS — D225 Melanocytic nevi of trunk: Secondary | ICD-10-CM | POA: Diagnosis not present

## 2018-05-10 DIAGNOSIS — L820 Inflamed seborrheic keratosis: Secondary | ICD-10-CM | POA: Diagnosis not present

## 2018-05-15 ENCOUNTER — Other Ambulatory Visit: Payer: Self-pay | Admitting: Internal Medicine

## 2018-05-15 DIAGNOSIS — Z1231 Encounter for screening mammogram for malignant neoplasm of breast: Secondary | ICD-10-CM

## 2018-05-19 DIAGNOSIS — M19072 Primary osteoarthritis, left ankle and foot: Secondary | ICD-10-CM | POA: Diagnosis not present

## 2018-05-24 DIAGNOSIS — R311 Benign essential microscopic hematuria: Secondary | ICD-10-CM | POA: Diagnosis not present

## 2018-05-25 ENCOUNTER — Encounter: Payer: Self-pay | Admitting: Gastroenterology

## 2018-05-25 ENCOUNTER — Encounter

## 2018-05-25 ENCOUNTER — Ambulatory Visit (INDEPENDENT_AMBULATORY_CARE_PROVIDER_SITE_OTHER): Payer: Medicare Other | Admitting: Gastroenterology

## 2018-05-25 ENCOUNTER — Encounter (INDEPENDENT_AMBULATORY_CARE_PROVIDER_SITE_OTHER): Payer: Self-pay

## 2018-05-25 DIAGNOSIS — Z86010 Personal history of colonic polyps: Secondary | ICD-10-CM

## 2018-05-25 NOTE — Patient Instructions (Signed)
If you are age 78 or older, your body mass index should be between 23-30. Your There is no height or weight on file to calculate BMI. If this is out of the aforementioned range listed, please consider follow up with your Primary Care Provider.  If you are age 18 or younger, your body mass index should be between 19-25. Your There is no height or weight on file to calculate BMI. If this is out of the aformentioned range listed, please consider follow up with your Primary Care Provider.   It has been recommended to you by your physician that you have a(n) colonoscopy completed. Per your request, we did not schedule the procedure(s) today. Please contact our office at [phone] should you decide to have the procedure completed. We wills set you up for a nurse visit at that time.   It was a pleasure to meet you today!  Dr. Loletha Carrow

## 2018-05-25 NOTE — Progress Notes (Signed)
[facility] Consult Note:  History: Stephanie Baker 05/25/2018   Reason for consult/chief complaint: hx of colon polyps (Recieved recall letter to change to an OV.  Denies GI complaints) History of colon polyp  Subjective  HPI:  This is a very pleasant 76 year old woman here for a recall visit for history of colon polyps.  On her last colonoscopy with Dr. Sharlett Iles in April 2014, a 5 mm cecal tubular adenoma was removed.  Stephanie Baker has no chronic digestive symptoms.  She denies chronic abdominal pain, change in bowel habits, rectal bleeding, nausea, vomiting early satiety, dysphagia or weight loss.  She is recovering from a recent left foot surgery.  ROS:  Review of Systems  Bilateral knee pain, prior knee replacements.  Left foot pain recovering from surgery. She denies chronic chest pain or dyspnea.  Past Medical History: Past Medical History:  Diagnosis Date  . Arthritis    OA  . Hematuria    idiopathic- Dr. [facility]  . Hypertension   . Hypothyroidism      Past Surgical History: Past Surgical History:  Procedure Laterality Date  . ABDOMINAL HYSTERECTOMY    . APPENDECTOMY    . BACK SURGERY     lumbar  . EYE SURGERY     bilateral cataract extraction with IOL  . GASTROC RECESSION EXTREMITY Left 02/23/2018   Procedure: Left Gastroc Recession;  Surgeon: Wylene Simmer, MD;  Location: [facility];  Service: Orthopedics;  Laterality: Left;  . HAND SURGERY     left thumb MP  . JOINT REPLACEMENT     right knee  . PARTIAL KNEE ARTHROPLASTY  11/14/2012   Procedure: UNICOMPARTMENTAL KNEE;  Surgeon: Mauri Pole, MD;  Location: [facility];  Service: Orthopedics;  Laterality: Left;  . TARSAL METATARSAL ARTHRODESIS Left 02/23/2018   Procedure: Left First and Second Tarsometatarsal Arthrodesis;  Surgeon: Wylene Simmer, MD;  Location: [facility];  Service: Orthopedics;  Laterality: Left;  . thumb surgery     left  . VAGINAL  HYSTERECTOMY       Family History: Family History  Problem Relation Age of Onset  . Colon cancer Neg Hx   . Stomach cancer Neg Hx     Social History: Social History   Socioeconomic History  . Marital status: Married    Spouse name: Not on file  . Number of children: Not on file  . Years of education: Not on file  . Highest education level: Not on file  Occupational History  . Not on file  Social Needs  . Financial resource strain: Not on file  . Food insecurity:    Worry: Not on file    Inability: Not on file  . Transportation needs:    Medical: Not on file    Non-medical: Not on file  Tobacco Use  . Smoking status: Former Smoker    Types: Cigarettes    Last attempt to quit: 12/27/1988    Years since quitting: 29.4  . Smokeless tobacco: Never Used  Substance and Sexual Activity  . Alcohol use: Yes    Alcohol/week: 4.2 oz    Types: 7 Glasses of wine per week    Comment: wine nightly   1 glass  . Drug use: No  . Sexual activity: Not on file  Lifestyle  . Physical activity:    Days per week: Not on file    Minutes per session: Not on file  . Stress: Not on file  Relationships  .  Social connections:    Talks on phone: Not on file    Gets together: Not on file    Attends religious service: Not on file    Active member of club or organization: Not on file    Attends meetings of clubs or organizations: Not on file    Relationship status: Not on file  Other Topics Concern  . Not on file  Social History Narrative  . Not on file   She lives at [place], and her husband is a retired Research scientist (physical sciences)  Allergies: No Known Allergies  Outpatient Meds: Current Outpatient Medications  Medication Sig Dispense Refill  . amLODipine (NORVASC) 2.5 MG tablet Take 2.5 mg by mouth daily before breakfast.    . aspirin 81 MG tablet Aspir-81    . Biotin 5000 MCG CAPS Take 1 capsule by mouth daily.    Marland Kitchen CALCIUM & MAGNESIUM CARBONATES PO Take 1 tablet by mouth at  bedtime.    . Cholecalciferol (VITAMIN D) 2000 UNITS CAPS Take 1 capsule by mouth at bedtime.    . cycloSPORINE (RESTASIS) 0.05 % ophthalmic emulsion Place 1 drop into both eyes 2 (two) times daily.    . Evening Primrose Oil CAPS Take 1 capsule by mouth 2 (two) times daily.    . fish oil-omega-3 fatty acids 1000 MG capsule Take 1 g by mouth 3 (three) times daily.    Marland Kitchen glucosamine-chondroitin 500-400 MG tablet Take 1 tablet by mouth daily.     Marland Kitchen levothyroxine (SYNTHROID, LEVOTHROID) 25 MCG tablet Take 25 mcg by mouth daily before breakfast.    . meloxicam (MOBIC) 7.5 MG tablet Take 7.5 mg by mouth daily as needed.     . Multiple Vitamins-Minerals (ULTRA VITA TIME IRON FREE PO) Take 1 tablet by mouth daily.    Marland Kitchen olmesartan-hydrochlorothiazide (BENICAR HCT) 40-12.5 MG per tablet Take 1 tablet by mouth daily before breakfast.    . potassium chloride SA (K-DUR,KLOR-CON) 20 MEQ tablet Take 20 mEq by mouth daily before breakfast.    . Vitamin D, Ergocalciferol, (DRISDOL) 50000 UNITS CAPS Take 50,000 Units by mouth every 30 (thirty) days. On the first of the month     No current facility-administered medications for this visit.       ___________________________________________________________________ Objective   Exam:  There were no vitals taken for this visit.   General: this is a(n) well-appearing woman  Eyes: sclera anicteric, no redness  ENT: oral mucosa moist without lesions, no cervical or supraclavicular lymphadenopathy, good dentition  CV: RRR without murmur, S1/S2, no JVD, no peripheral edema  Resp: clear to auscultation bilaterally, normal RR and effort noted  GI: soft, no tenderness, with active bowel sounds. No guarding or palpable organomegaly noted.  Skin; warm and dry, no rash or jaundice noted  Neuro: awake, alert and oriented x 3. Normal gross motor function and fluent speech   Assessment: Encounter Diagnosis  Name Primary?  . Personal history of colonic polyps  Yes    Based on current guidelines, a surveillance colonoscopy is indicated, and she is certainly well enough to have that done.  She has no chronic digestive symptoms. Stephanie Baker is leaving this afternoon to be away at her summer place until September.  I have asked her to contact us when she returns, at which time she will be set up with a nurse visit in our endoscopy lab and put on my schedule for a colonoscopy.  She was agreeable to a colonoscopy and all questions were answered.  Total 20-minute visit, over half spent face-to-face with patient.  Thank you for the courtesy of this consult.  Please call me with any questions or concerns.  Nelida Meuse III  CC: Leanna Battles, MD

## 2018-05-31 DIAGNOSIS — M79672 Pain in left foot: Secondary | ICD-10-CM | POA: Diagnosis not present

## 2018-05-31 DIAGNOSIS — M7742 Metatarsalgia, left foot: Secondary | ICD-10-CM | POA: Diagnosis not present

## 2018-05-31 DIAGNOSIS — M19079 Primary osteoarthritis, unspecified ankle and foot: Secondary | ICD-10-CM | POA: Diagnosis not present

## 2018-05-31 DIAGNOSIS — Z09 Encounter for follow-up examination after completed treatment for conditions other than malignant neoplasm: Secondary | ICD-10-CM | POA: Diagnosis not present

## 2018-06-01 DIAGNOSIS — M19072 Primary osteoarthritis, left ankle and foot: Secondary | ICD-10-CM | POA: Diagnosis not present

## 2018-06-05 DIAGNOSIS — M19072 Primary osteoarthritis, left ankle and foot: Secondary | ICD-10-CM | POA: Diagnosis not present

## 2018-06-12 DIAGNOSIS — M19072 Primary osteoarthritis, left ankle and foot: Secondary | ICD-10-CM | POA: Diagnosis not present

## 2018-06-23 DIAGNOSIS — M25551 Pain in right hip: Secondary | ICD-10-CM | POA: Diagnosis not present

## 2018-06-23 DIAGNOSIS — M7061 Trochanteric bursitis, right hip: Secondary | ICD-10-CM | POA: Diagnosis not present

## 2018-07-10 ENCOUNTER — Ambulatory Visit
Admission: RE | Admit: 2018-07-10 | Discharge: 2018-07-10 | Disposition: A | Discharge status: Home / Self Care | Payer: Medicare Other | Source: Ambulatory Visit | Attending: Internal Medicine | Admitting: Internal Medicine

## 2018-07-10 DIAGNOSIS — M7061 Trochanteric bursitis, right hip: Secondary | ICD-10-CM | POA: Diagnosis not present

## 2018-07-10 DIAGNOSIS — Z1231 Encounter for screening mammogram for malignant neoplasm of breast: Secondary | ICD-10-CM

## 2018-07-10 DIAGNOSIS — M25551 Pain in right hip: Secondary | ICD-10-CM | POA: Diagnosis not present

## 2018-07-12 ENCOUNTER — Ambulatory Visit: Payer: Self-pay

## 2018-08-02 ENCOUNTER — Other Ambulatory Visit: Payer: Self-pay | Admitting: Internal Medicine

## 2018-08-02 ENCOUNTER — Ambulatory Visit
Admission: RE | Admit: 2018-08-02 | Discharge: 2018-08-02 | Disposition: A | Discharge status: Home / Self Care | Payer: Medicare Other | Source: Ambulatory Visit | Attending: Internal Medicine | Admitting: Internal Medicine

## 2018-08-02 DIAGNOSIS — R0789 Other chest pain: Secondary | ICD-10-CM

## 2018-08-02 DIAGNOSIS — R0600 Dyspnea, unspecified: Secondary | ICD-10-CM | POA: Insufficient documentation

## 2018-08-02 DIAGNOSIS — R0609 Other forms of dyspnea: Secondary | ICD-10-CM

## 2018-08-02 DIAGNOSIS — I10 Essential (primary) hypertension: Secondary | ICD-10-CM | POA: Diagnosis not present

## 2018-08-02 DIAGNOSIS — R0602 Shortness of breath: Secondary | ICD-10-CM | POA: Diagnosis not present

## 2018-08-02 DIAGNOSIS — Z6827 Body mass index (BMI) 27.0-27.9, adult: Secondary | ICD-10-CM | POA: Diagnosis not present

## 2018-08-02 MED ORDER — IOPAMIDOL (ISOVUE-370) INJECTION 76%
75.0000 mL | Freq: Once | INTRAVENOUS | Status: AC | PRN
  Administered 2018-08-02: 75 mL via INTRAVENOUS

## 2018-09-05 DIAGNOSIS — Z96653 Presence of artificial knee joint, bilateral: Secondary | ICD-10-CM | POA: Diagnosis not present

## 2018-09-05 DIAGNOSIS — Z7982 Long term (current) use of aspirin: Secondary | ICD-10-CM | POA: Diagnosis not present

## 2018-09-05 DIAGNOSIS — K219 Gastro-esophageal reflux disease without esophagitis: Secondary | ICD-10-CM | POA: Diagnosis not present

## 2018-09-05 DIAGNOSIS — R079 Chest pain, unspecified: Secondary | ICD-10-CM | POA: Diagnosis not present

## 2018-09-05 DIAGNOSIS — E039 Hypothyroidism, unspecified: Secondary | ICD-10-CM | POA: Diagnosis not present

## 2018-09-05 DIAGNOSIS — I10 Essential (primary) hypertension: Secondary | ICD-10-CM | POA: Diagnosis not present

## 2018-09-05 DIAGNOSIS — Z87891 Personal history of nicotine dependence: Secondary | ICD-10-CM | POA: Diagnosis not present

## 2018-09-05 DIAGNOSIS — I083 Combined rheumatic disorders of mitral, aortic and tricuspid valves: Secondary | ICD-10-CM | POA: Diagnosis not present

## 2018-09-05 DIAGNOSIS — R0789 Other chest pain: Secondary | ICD-10-CM | POA: Diagnosis not present

## 2018-09-05 DIAGNOSIS — E785 Hyperlipidemia, unspecified: Secondary | ICD-10-CM | POA: Diagnosis not present

## 2018-09-05 DIAGNOSIS — Z79899 Other long term (current) drug therapy: Secondary | ICD-10-CM | POA: Diagnosis not present

## 2018-09-06 DIAGNOSIS — Z7982 Long term (current) use of aspirin: Secondary | ICD-10-CM | POA: Diagnosis not present

## 2018-09-06 DIAGNOSIS — K219 Gastro-esophageal reflux disease without esophagitis: Secondary | ICD-10-CM | POA: Diagnosis not present

## 2018-09-06 DIAGNOSIS — I10 Essential (primary) hypertension: Secondary | ICD-10-CM | POA: Diagnosis not present

## 2018-09-06 DIAGNOSIS — E039 Hypothyroidism, unspecified: Secondary | ICD-10-CM | POA: Diagnosis not present

## 2018-09-06 DIAGNOSIS — R079 Chest pain, unspecified: Secondary | ICD-10-CM | POA: Diagnosis not present

## 2018-09-06 DIAGNOSIS — Z87891 Personal history of nicotine dependence: Secondary | ICD-10-CM | POA: Diagnosis not present

## 2018-09-06 DIAGNOSIS — Z96653 Presence of artificial knee joint, bilateral: Secondary | ICD-10-CM | POA: Diagnosis not present

## 2018-09-06 DIAGNOSIS — Z79899 Other long term (current) drug therapy: Secondary | ICD-10-CM | POA: Diagnosis not present

## 2018-09-06 DIAGNOSIS — E785 Hyperlipidemia, unspecified: Secondary | ICD-10-CM | POA: Diagnosis not present

## 2018-09-06 DIAGNOSIS — I083 Combined rheumatic disorders of mitral, aortic and tricuspid valves: Secondary | ICD-10-CM | POA: Diagnosis not present

## 2018-09-14 ENCOUNTER — Telehealth: Payer: Self-pay | Admitting: Gastroenterology

## 2018-09-14 NOTE — Telephone Encounter (Signed)
Received records, they are on Dr. Loletha Carrow' desk. Please advise on procedure(s). Thanks.

## 2018-09-14 NOTE — Telephone Encounter (Signed)
Patient states she is ready to have colon per ov on 5.30.19 with Dr.Danis, but pt states that she was having chest pain and went to the heart doctor, who told her it was acid reflux and suggested an egd. Patient is wanting to know if she can schedule both procedures together.

## 2018-09-14 NOTE — Telephone Encounter (Signed)
Please make an appointment for her to see me. Do not scan records until office visit (so they are available for visit).

## 2018-09-14 NOTE — Telephone Encounter (Signed)
Spoke to patient, she states that she was in [place] for the summer, was having chest pain. She went to an internal medicine MD in [place] who did a cardiac workup, patient states that it revealed no heart issues. Patient was put on Nexium BID and has helped her symptoms. She will contact that physician and have them fax over all the records. I let her know that I will let Dr. Loletha Carrow review and will call her back with the plan.

## 2018-09-15 NOTE — Telephone Encounter (Signed)
Spoke to patient and scheduled her a follow up visit for 10/19/18.

## 2018-09-21 DIAGNOSIS — M25561 Pain in right knee: Secondary | ICD-10-CM | POA: Diagnosis not present

## 2018-09-21 DIAGNOSIS — M25562 Pain in left knee: Secondary | ICD-10-CM | POA: Diagnosis not present

## 2018-09-21 DIAGNOSIS — M25551 Pain in right hip: Secondary | ICD-10-CM | POA: Diagnosis not present

## 2018-09-28 DIAGNOSIS — L218 Other seborrheic dermatitis: Secondary | ICD-10-CM | POA: Diagnosis not present

## 2018-09-30 DIAGNOSIS — Z23 Encounter for immunization: Secondary | ICD-10-CM | POA: Diagnosis not present

## 2018-10-09 DIAGNOSIS — S81802A Unspecified open wound, left lower leg, initial encounter: Secondary | ICD-10-CM | POA: Insufficient documentation

## 2018-10-09 DIAGNOSIS — S81801A Unspecified open wound, right lower leg, initial encounter: Secondary | ICD-10-CM | POA: Diagnosis not present

## 2018-10-09 DIAGNOSIS — Z4800 Encounter for change or removal of nonsurgical wound dressing: Secondary | ICD-10-CM | POA: Diagnosis not present

## 2018-10-09 DIAGNOSIS — Z6828 Body mass index (BMI) 28.0-28.9, adult: Secondary | ICD-10-CM | POA: Diagnosis not present

## 2018-10-18 DIAGNOSIS — Z4800 Encounter for change or removal of nonsurgical wound dressing: Secondary | ICD-10-CM | POA: Diagnosis not present

## 2018-10-18 DIAGNOSIS — S81809S Unspecified open wound, unspecified lower leg, sequela: Secondary | ICD-10-CM | POA: Diagnosis not present

## 2018-10-19 ENCOUNTER — Ambulatory Visit: Payer: Self-pay | Admitting: Gastroenterology

## 2018-10-25 ENCOUNTER — Encounter: Payer: Self-pay | Admitting: Gastroenterology

## 2018-10-25 ENCOUNTER — Ambulatory Visit (INDEPENDENT_AMBULATORY_CARE_PROVIDER_SITE_OTHER): Payer: Medicare Other | Admitting: Gastroenterology

## 2018-10-25 VITALS — BP 126/72 | HR 76 | Ht 64.0 in | Wt 160.2 lb

## 2018-10-25 DIAGNOSIS — R0789 Other chest pain: Secondary | ICD-10-CM

## 2018-10-25 DIAGNOSIS — S81809S Unspecified open wound, unspecified lower leg, sequela: Secondary | ICD-10-CM | POA: Diagnosis not present

## 2018-10-25 DIAGNOSIS — Z6827 Body mass index (BMI) 27.0-27.9, adult: Secondary | ICD-10-CM | POA: Diagnosis not present

## 2018-10-25 DIAGNOSIS — Z86010 Personal history of colonic polyps: Secondary | ICD-10-CM | POA: Diagnosis not present

## 2018-10-25 DIAGNOSIS — Z4800 Encounter for change or removal of nonsurgical wound dressing: Secondary | ICD-10-CM | POA: Diagnosis not present

## 2018-10-25 MED ORDER — PEG-KCL-NACL-NASULF-NA ASC-C 140 G PO SOLR: 140.0000 g | ORAL | 0 refills | Status: DC

## 2018-10-25 NOTE — Patient Instructions (Signed)
If you are age 78 or older, your body mass index should be between 23-30. Your Body mass index is 27.51 kg/m. If this is out of the aforementioned range listed, please consider follow up with your Primary Care Provider.  If you are age 40 or younger, your body mass index should be between 19-25. Your Body mass index is 27.51 kg/m. If this is out of the aformentioned range listed, please consider follow up with your Primary Care Provider.   You have been scheduled for an endoscopy and colonoscopy. Please follow the written instructions given to you at your visit today. Please pick up your prep supplies at the pharmacy within the next 1-3 days. If you use inhalers (even only as needed), please bring them with you on the day of your procedure. Your physician has requested that you go to www.startemmi.com and enter the access code given to you at your visit today. This web site gives a general overview about your procedure. However, you should still follow specific instructions given to you by our office regarding your preparation for the procedure.  It was a pleasure to see you today!  Dr. Loletha Carrow

## 2018-10-25 NOTE — Progress Notes (Signed)
[facility] Progress Note  Chief Complaint: Chest pain and history of colon polyps  Subjective  History:  I saw her in May of this year for history of colon polyps with plans to perform a surveillance colonoscopy.  She was traveling for the summer and planned to call us when she returned to schedule her colonoscopy.  Over the summer she had chest pain and records from a primary care provider in [facility] indicate a normal cardiac work-up.  It was suggested that perhaps reflux was the cause of chest pain and she was put on Nexium. She is taking the Nexium once daily, and has had no more chest pain episodes since then.  She denies dysphagia, odynophagia, nausea, vomiting, early satiety or weight loss.  Review of systems  Remainder of systems negative except as above  The patient's Past Medical, Family and Social History were reviewed and are on file in the EMR.  Objective:  Med list reviewed  Current Outpatient Medications:  .  amLODipine (NORVASC) 2.5 MG tablet, Take 2.5 mg by mouth daily before breakfast., Disp: , Rfl:  .  aspirin 81 MG tablet, Aspir-81, Disp: , Rfl:  .  Biotin 5000 MCG CAPS, Take 1 capsule by mouth daily., Disp: , Rfl:  .  CALCIUM & MAGNESIUM CARBONATES PO, Take 1 tablet by mouth at bedtime., Disp: , Rfl:  .  Cholecalciferol (VITAMIN D) 2000 UNITS CAPS, Take 1 capsule by mouth at bedtime., Disp: , Rfl:  .  cycloSPORINE (RESTASIS) 0.05 % ophthalmic emulsion, Place 1 drop into both eyes 2 (two) times daily., Disp: , Rfl:  .  esomeprazole (NEXIUM) 20 MG capsule, Take 20 mg by mouth daily at 12 noon., Disp: , Rfl:  .  Evening Primrose Oil CAPS, Take 1 capsule by mouth 2 (two) times daily., Disp: , Rfl:  .  fish oil-omega-3 fatty acids 1000 MG capsule, Take 1 g by mouth 3 (three) times daily., Disp: , Rfl:  .  glucosamine-chondroitin 500-400 MG tablet, Take 1 tablet by mouth daily. , Disp: , Rfl:  .  levothyroxine (SYNTHROID, LEVOTHROID) 25 MCG  tablet, Take 25 mcg by mouth daily before breakfast., Disp: , Rfl:  .  meloxicam (MOBIC) 7.5 MG tablet, Take 7.5 mg by mouth daily as needed. , Disp: , Rfl:  .  Multiple Vitamins-Minerals (ULTRA VITA TIME IRON FREE PO), Take 1 tablet by mouth daily., Disp: , Rfl:  .  olmesartan-hydrochlorothiazide (BENICAR HCT) 40-12.5 MG per tablet, Take 1 tablet by mouth daily before breakfast., Disp: , Rfl:  .  potassium chloride SA (K-DUR,KLOR-CON) 20 MEQ tablet, Take 20 mEq by mouth daily before breakfast., Disp: , Rfl:  .  Vitamin D, Ergocalciferol, (DRISDOL) 50000 UNITS CAPS, Take 50,000 Units by mouth every 30 (thirty) days. On the first of the month, Disp: , Rfl:  .  PEG-KCl-NaCl-NaSulf-Na Asc-C (PLENVU) 140 g SOLR, Take 140 g by mouth as directed., Disp: 1 each, Rfl: 0   Vital signs in last 24 hrs: Vitals:   10/25/18 1455  BP: 126/72  Pulse: 76    Physical Exam  She is well-appearing, normal vocal quality  HEENT: sclera anicteric, oral mucosa moist without lesions  Neck: supple, no thyromegaly, JVD or lymphadenopathy  Cardiac: RRR without murmurs, S1S2 heard, no peripheral edema  Pulm: clear to auscultation bilaterally, normal RR and effort noted  Abdomen: soft, no tenderness, with active bowel sounds. No guarding or palpable hepatosplenomegaly.  Skin; warm and dry, no jaundice or rash  [facility] include admission history and physical from 09/05/2018 for chest pain, negative CTA echocardiogram with normal ejection fraction, moderate aortic regurgitation Normal CBC, LFTs, troponin Nuclear stress test with fair exercise tolerance, no ischemia seen on 09/06/2018    @ASSESSMENTPLANBEGIN @ Assessment: Encounter Diagnoses  Name Primary?  . Other chest pain Yes  . Personal history of colonic polyps    Noncardiac chest pain, possibly reflux related. Personal history of colon polyps, due for surveillance colonoscopy.   Plan: EGD and colonoscopy.  She is  agreeable after discussion of procedures and risks.  The benefits and risks of the planned procedure were described in detail with the patient or (when appropriate) their health care proxy.  Risks were outlined as including, but not limited to, bleeding, infection, perforation, adverse medication reaction leading to cardiac or pulmonary decompensation, or pancreatitis (if ERCP).  The limitation of incomplete mucosal visualization was also discussed.  No guarantees or warranties were given.  If EGD reassuring, probable wean off PPI to reassess for recurrence of chest pain.  Total time 25 minutes, over half spent face-to-face with patient in counseling and coordination of care.   Nelida Meuse III

## 2018-11-01 DIAGNOSIS — Z6827 Body mass index (BMI) 27.0-27.9, adult: Secondary | ICD-10-CM | POA: Diagnosis not present

## 2018-11-01 DIAGNOSIS — Z4800 Encounter for change or removal of nonsurgical wound dressing: Secondary | ICD-10-CM | POA: Diagnosis not present

## 2018-11-01 DIAGNOSIS — S81809S Unspecified open wound, unspecified lower leg, sequela: Secondary | ICD-10-CM | POA: Diagnosis not present

## 2018-11-08 DIAGNOSIS — Z4800 Encounter for change or removal of nonsurgical wound dressing: Secondary | ICD-10-CM | POA: Diagnosis not present

## 2018-11-08 DIAGNOSIS — S81809A Unspecified open wound, unspecified lower leg, initial encounter: Secondary | ICD-10-CM | POA: Diagnosis not present

## 2018-11-08 DIAGNOSIS — Z6827 Body mass index (BMI) 27.0-27.9, adult: Secondary | ICD-10-CM | POA: Diagnosis not present

## 2018-11-13 ENCOUNTER — Telehealth: Payer: Self-pay | Admitting: Gastroenterology

## 2018-11-13 MED ORDER — PEG-KCL-NACL-NASULF-NA ASC-C 140 G PO SOLR: 140.0000 g | ORAL | 0 refills | Status: DC

## 2018-11-13 NOTE — Telephone Encounter (Signed)
Done

## 2018-11-21 ENCOUNTER — Encounter: Payer: Self-pay | Admitting: Gastroenterology

## 2018-11-21 ENCOUNTER — Ambulatory Visit (AMBULATORY_SURGERY_CENTER): Payer: Medicare Other | Admitting: Gastroenterology

## 2018-11-21 VITALS — BP 95/55 | HR 66 | Temp 96.8°F | Resp 15 | Ht 64.0 in | Wt 160.0 lb

## 2018-11-21 DIAGNOSIS — R0789 Other chest pain: Secondary | ICD-10-CM

## 2018-11-21 DIAGNOSIS — K317 Polyp of stomach and duodenum: Secondary | ICD-10-CM | POA: Diagnosis not present

## 2018-11-21 DIAGNOSIS — Z1211 Encounter for screening for malignant neoplasm of colon: Secondary | ICD-10-CM | POA: Diagnosis not present

## 2018-11-21 DIAGNOSIS — Z86010 Personal history of colonic polyps: Secondary | ICD-10-CM

## 2018-11-21 DIAGNOSIS — D122 Benign neoplasm of ascending colon: Secondary | ICD-10-CM | POA: Diagnosis not present

## 2018-11-21 DIAGNOSIS — K219 Gastro-esophageal reflux disease without esophagitis: Secondary | ICD-10-CM | POA: Diagnosis not present

## 2018-11-21 MED ORDER — SODIUM CHLORIDE 0.9 % IV SOLN: 500.0000 mL | Freq: Once | INTRAVENOUS | Status: DC

## 2018-11-21 NOTE — Progress Notes (Signed)
Called to room to assist during endoscopic procedure.  Patient ID and intended procedure confirmed with present staff. Received instructions for my participation in the procedure from the performing physician.  

## 2018-11-21 NOTE — Patient Instructions (Signed)
Handouts Provided:  Polyps  Wean off Nexium by decreasing to every other day for 10 days, then twice a week for 10 days, then stop.  Can resume medicine as needed if chest pain recurs.   YOU HAD AN ENDOSCOPIC PROCEDURE TODAY AT [facility]:   Refer to the procedure report that was given to you for any specific questions about what was found during the examination.  If the procedure report does not answer your questions, please call your gastroenterologist to clarify.  If you requested that your care partner not be given the details of your procedure findings, then the procedure report has been included in a sealed envelope for you to review at your convenience later.  YOU SHOULD EXPECT: Some feelings of bloating in the abdomen. Passage of more gas than usual.  Walking can help get rid of the air that was put into your GI tract during the procedure and reduce the bloating. If you had a lower endoscopy (such as a colonoscopy or flexible sigmoidoscopy) you may notice spotting of blood in your stool or on the toilet paper. If you underwent a bowel prep for your procedure, you may not have a normal bowel movement for a few days.  Please Note:  You might notice some irritation and congestion in your nose or some drainage.  This is from the oxygen used during your procedure.  There is no need for concern and it should clear up in a day or so.  SYMPTOMS TO REPORT IMMEDIATELY:   Following lower endoscopy (colonoscopy or flexible sigmoidoscopy):  Excessive amounts of blood in the stool  Significant tenderness or worsening of abdominal pains  Swelling of the abdomen that is new, acute  Fever of 100F or higher   Following upper endoscopy (EGD)  Vomiting of blood or coffee ground material  New chest pain or pain under the shoulder blades  Painful or persistently difficult swallowing  New shortness of breath  Fever of 100F or higher  Black, tarry-looking stools  For urgent or  emergent issues, a gastroenterologist can be reached at any hour by calling [phone].   DIET:  We do recommend a small meal at first, but then you may proceed to your regular diet.  Drink plenty of fluids but you should avoid alcoholic beverages for 24 hours.  ACTIVITY:  You should plan to take it easy for the rest of today and you should NOT DRIVE or use heavy machinery until tomorrow (because of the sedation medicines used during the test).    FOLLOW UP: Our staff will call the number listed on your records the next business day following your procedure to check on you and address any questions or concerns that you may have regarding the information given to you following your procedure. If we do not reach you, we will leave a message.  However, if you are feeling well and you are not experiencing any problems, there is no need to return our call.  We will assume that you have returned to your regular daily activities without incident.  If any biopsies were taken you will be contacted by phone or by letter within the next 1-3 weeks.  Please call us at [phone] if you have not heard about the biopsies in 3 weeks.    SIGNATURES/CONFIDENTIALITY: You and/or your care partner have signed paperwork which will be entered into your electronic medical record.  These signatures attest to the fact that that the information above on  your After Visit Summary has been reviewed and is understood.  Full responsibility of the confidentiality of this discharge information lies with you and/or your care-partner.

## 2018-11-21 NOTE — Op Note (Signed)
Stephanie Baker: Stephanie Baker Procedure Date: 11/21/2018 1:58 PM MRN: [id number] Endoscopist: Mallie Mussel L. Loletha Carrow , MD Age: 77 Referring MD:  Date of Birth: 03-13-1941 Gender: Female Account #: [id number] Procedure:                Upper GI endoscopy Indications:              Chest pain (non cardiac) - has not recurred on                            daily Nexium Medicines:                Monitored Anesthesia Care Procedure:                Pre-Anesthesia Assessment:                           - Prior to the procedure, a History and Physical                            was performed, and patient medications and                            allergies were reviewed. The patient's tolerance of                            previous anesthesia was also reviewed. The risks                            and benefits of the procedure and the sedation                            options and risks were discussed with the patient.                            All questions were answered, and informed consent                            was obtained. Anticoagulants: The patient has taken                            aspirin. It was decided not to withhold this                            medication prior to the procedure. ASA Grade                            Assessment: II - A patient with mild systemic                            disease. After reviewing the risks and benefits,                            the patient was deemed in satisfactory condition to  undergo the procedure.                           After obtaining informed consent, the endoscope was                            passed under direct vision. Throughout the                            procedure, the patient's blood pressure, pulse, and                            oxygen saturations were monitored continuously. The                            Endoscope was introduced through the mouth, and        advanced to the second part of duodenum. The upper                            GI endoscopy was accomplished without difficulty.                            The patient tolerated the procedure well. Scope In: Scope Out: Findings:                 The esophagus was normal.                           The cardia and gastric fundus were normal on                            retroflexion.                           Multiple sessile fundic gland polyps were found in                            the gastric fundus and in the gastric body.                           The examined duodenum was normal. Complications:            No immediate complications. Estimated Blood Loss:     Estimated blood loss: none. Impression:               - Normal esophagus.                           - Multiple fundic gland polyps. Common and benign                            finding from chronic use of PPI medicine (i.e.                            Nexium).                           -  Normal examined duodenum.                           - No specimens collected. Recommendation:           - Patient has a contact number available for                            emergencies. The signs and symptoms of potential                            delayed complications were discussed with the                            patient. Return to normal activities tomorrow.                            Written discharge instructions were provided to the                            patient.                           - Resume previous diet.                           - Continue present medications, but wean off Nexium                            by decreasing to every other day for 10 days, then                            twice a week for 10 days, then stop. Can resume                            medicine as needed if chest pain recurs.                           - See the other procedure note for documentation of                            additional  recommendations. [name] L. Loletha Carrow, MD 11/21/2018 2:43:48 PM This report has been signed electronically.

## 2018-11-21 NOTE — Op Note (Signed)
[facility] Patient Name: Pairlee Sawtell Procedure Date: 11/21/2018 1:57 PM MRN: [id number] Endoscopist: Mallie Mussel L. Loletha Carrow , MD Age: 77 Referring MD:  Date of Birth: 05/15/1941 Gender: Female Account #: [id number] Procedure:                Colonoscopy Indications:              Surveillance: Personal history of adenomatous                            polyps on last colonoscopy 5 years ago (29mm cecal                            TA 2014) Medicines:                Monitored Anesthesia Care Procedure:                Pre-Anesthesia Assessment:                           - Prior to the procedure, a History and Physical                            was performed, and patient medications and                            allergies were reviewed. The patient's tolerance of                            previous anesthesia was also reviewed. The risks                            and benefits of the procedure and the sedation                            options and risks were discussed with the patient.                            All questions were answered, and informed consent                            was obtained. Anticoagulants: The patient has taken                            aspirin. It was decided not to withhold this                            medication prior to the procedure. ASA Grade                            Assessment: II - A patient with mild systemic                            disease. After reviewing the risks and benefits,  the patient was deemed in satisfactory condition to                            undergo the procedure.                           After obtaining informed consent, the colonoscope                            was passed under direct vision. Throughout the                            procedure, the patient's blood pressure, pulse, and                            oxygen saturations were monitored continuously. The   Colonoscope was introduced through the anus and                            advanced to the the cecum, identified by                            appendiceal orifice and ileocecal valve. The                            colonoscopy was performed without difficulty. The                            patient tolerated the procedure well. The quality                            of the bowel preparation was excellent. The                            ileocecal valve, appendiceal orifice, and rectum                            were photographed. Scope In: 2:11:42 PM Scope Out: 2:33:17 PM Scope Withdrawal Time: 0 hours 12 minutes 33 seconds  Total Procedure Duration: 0 hours 21 minutes 35 seconds  Findings:                 The perianal and digital rectal examinations were                            normal.                           A 8 mm polyp was found in the ascending colon. The                            polyp was sessile. The polyp was removed with a                            piecemeal technique using a cold snare. Resection  and retrieval were complete.                           The exam was otherwise without abnormality on                            direct and retroflexion views. Complications:            No immediate complications. Estimated Blood Loss:     Estimated blood loss was minimal. Impression:               - One 8 mm polyp in the ascending colon, removed                            piecemeal using a cold snare. Resected and                            retrieved.                           - The examination was otherwise normal on direct                            and retroflexion views. Recommendation:           - Patient has a contact number available for                            emergencies. The signs and symptoms of potential                            delayed complications were discussed with the                            patient. Return to normal  activities tomorrow.                            Written discharge instructions were provided to the                            patient.                           - Resume previous diet.                           - Continue present medications.                           - Await pathology results.                           - Based on current guidelines, no future                            screening/surveillance colonoscopy due to age. Kale Dols L. Loletha Carrow, MD 11/21/2018 2:46:39 PM This report has been signed electronically.

## 2018-11-21 NOTE — Progress Notes (Signed)
Pt's states no medical or surgical changes since previsit or office visit. 

## 2018-11-21 NOTE — Progress Notes (Signed)
A and O x3. Report to RN. Tolerated MAC anesthesia well.Teeth unchanged after procedure.

## 2018-11-22 ENCOUNTER — Telehealth: Payer: Self-pay | Admitting: *Deleted

## 2018-11-22 NOTE — Telephone Encounter (Signed)
  Follow up Call-  Call back number 11/21/2018  Post procedure Call Back phone  # [phone]  Permission to leave phone message Yes  Some recent data might be hidden     Patient questions:  Do you have a fever, pain , or abdominal swelling? No. Pain Score  0 *  Have you tolerated food without any problems? Yes.    Have you been able to return to your normal activities? Yes.    Do you have any questions about your discharge instructions: Diet   No. Medications  No. Follow up visit  No.  Do you have questions or concerns about your Care? No.  Actions: * If pain score is 4 or above: No action needed, pain <4.

## 2018-11-30 ENCOUNTER — Encounter: Payer: Self-pay | Admitting: Gastroenterology

## 2019-01-12 DIAGNOSIS — Z09 Encounter for follow-up examination after completed treatment for conditions other than malignant neoplasm: Secondary | ICD-10-CM | POA: Diagnosis not present

## 2019-01-12 DIAGNOSIS — M7742 Metatarsalgia, left foot: Secondary | ICD-10-CM | POA: Diagnosis not present

## 2019-01-12 DIAGNOSIS — M19072 Primary osteoarthritis, left ankle and foot: Secondary | ICD-10-CM | POA: Diagnosis not present

## 2019-01-12 DIAGNOSIS — M79672 Pain in left foot: Secondary | ICD-10-CM | POA: Diagnosis not present

## 2019-02-23 DIAGNOSIS — M79672 Pain in left foot: Secondary | ICD-10-CM | POA: Diagnosis not present

## 2019-02-23 DIAGNOSIS — Z978 Presence of other specified devices: Secondary | ICD-10-CM | POA: Diagnosis not present

## 2019-03-01 DIAGNOSIS — J00 Acute nasopharyngitis [common cold]: Secondary | ICD-10-CM | POA: Insufficient documentation

## 2019-03-01 DIAGNOSIS — Z6828 Body mass index (BMI) 28.0-28.9, adult: Secondary | ICD-10-CM | POA: Diagnosis not present

## 2019-03-09 DIAGNOSIS — H02052 Trichiasis without entropian right lower eyelid: Secondary | ICD-10-CM | POA: Diagnosis not present

## 2019-03-09 DIAGNOSIS — H26493 Other secondary cataract, bilateral: Secondary | ICD-10-CM | POA: Diagnosis not present

## 2019-03-09 DIAGNOSIS — H43813 Vitreous degeneration, bilateral: Secondary | ICD-10-CM | POA: Diagnosis not present

## 2019-03-09 DIAGNOSIS — H04123 Dry eye syndrome of bilateral lacrimal glands: Secondary | ICD-10-CM | POA: Diagnosis not present

## 2019-03-20 DIAGNOSIS — E559 Vitamin D deficiency, unspecified: Secondary | ICD-10-CM | POA: Diagnosis not present

## 2019-03-20 DIAGNOSIS — E038 Other specified hypothyroidism: Secondary | ICD-10-CM | POA: Diagnosis not present

## 2019-03-20 DIAGNOSIS — E7849 Other hyperlipidemia: Secondary | ICD-10-CM | POA: Diagnosis not present

## 2019-03-20 DIAGNOSIS — I10 Essential (primary) hypertension: Secondary | ICD-10-CM | POA: Diagnosis not present

## 2019-03-21 DIAGNOSIS — R82998 Other abnormal findings in urine: Secondary | ICD-10-CM | POA: Diagnosis not present

## 2019-03-21 DIAGNOSIS — I10 Essential (primary) hypertension: Secondary | ICD-10-CM | POA: Diagnosis not present

## 2019-03-27 DIAGNOSIS — Z1331 Encounter for screening for depression: Secondary | ICD-10-CM | POA: Diagnosis not present

## 2019-03-27 DIAGNOSIS — E785 Hyperlipidemia, unspecified: Secondary | ICD-10-CM | POA: Diagnosis not present

## 2019-03-27 DIAGNOSIS — Z1339 Encounter for screening examination for other mental health and behavioral disorders: Secondary | ICD-10-CM | POA: Diagnosis not present

## 2019-03-27 DIAGNOSIS — E559 Vitamin D deficiency, unspecified: Secondary | ICD-10-CM | POA: Diagnosis not present

## 2019-03-27 DIAGNOSIS — J00 Acute nasopharyngitis [common cold]: Secondary | ICD-10-CM | POA: Diagnosis not present

## 2019-03-27 DIAGNOSIS — E039 Hypothyroidism, unspecified: Secondary | ICD-10-CM | POA: Diagnosis not present

## 2019-03-27 DIAGNOSIS — M79672 Pain in left foot: Secondary | ICD-10-CM | POA: Diagnosis not present

## 2019-03-27 DIAGNOSIS — K2970 Gastritis, unspecified, without bleeding: Secondary | ICD-10-CM | POA: Diagnosis not present

## 2019-03-27 DIAGNOSIS — I10 Essential (primary) hypertension: Secondary | ICD-10-CM | POA: Diagnosis not present

## 2019-03-27 DIAGNOSIS — R3121 Asymptomatic microscopic hematuria: Secondary | ICD-10-CM | POA: Diagnosis not present

## 2019-03-27 DIAGNOSIS — M48061 Spinal stenosis, lumbar region without neurogenic claudication: Secondary | ICD-10-CM | POA: Diagnosis not present

## 2019-05-02 DIAGNOSIS — L814 Other melanin hyperpigmentation: Secondary | ICD-10-CM | POA: Diagnosis not present

## 2019-05-02 DIAGNOSIS — L720 Epidermal cyst: Secondary | ICD-10-CM | POA: Diagnosis not present

## 2019-05-02 DIAGNOSIS — L821 Other seborrheic keratosis: Secondary | ICD-10-CM | POA: Diagnosis not present

## 2019-05-02 DIAGNOSIS — D692 Other nonthrombocytopenic purpura: Secondary | ICD-10-CM | POA: Diagnosis not present

## 2019-05-02 DIAGNOSIS — D1801 Hemangioma of skin and subcutaneous tissue: Secondary | ICD-10-CM | POA: Diagnosis not present

## 2019-05-16 DIAGNOSIS — M19071 Primary osteoarthritis, right ankle and foot: Secondary | ICD-10-CM | POA: Diagnosis not present

## 2019-05-16 DIAGNOSIS — L03031 Cellulitis of right toe: Secondary | ICD-10-CM | POA: Diagnosis not present

## 2019-05-16 DIAGNOSIS — M19072 Primary osteoarthritis, left ankle and foot: Secondary | ICD-10-CM | POA: Diagnosis not present

## 2019-05-25 DIAGNOSIS — N281 Cyst of kidney, acquired: Secondary | ICD-10-CM | POA: Diagnosis not present

## 2019-05-25 DIAGNOSIS — R311 Benign essential microscopic hematuria: Secondary | ICD-10-CM | POA: Diagnosis not present

## 2019-05-30 DIAGNOSIS — M25532 Pain in left wrist: Secondary | ICD-10-CM | POA: Diagnosis not present

## 2019-05-30 DIAGNOSIS — L03031 Cellulitis of right toe: Secondary | ICD-10-CM | POA: Diagnosis not present

## 2019-05-30 DIAGNOSIS — M25542 Pain in joints of left hand: Secondary | ICD-10-CM | POA: Diagnosis not present

## 2019-05-31 DIAGNOSIS — J3489 Other specified disorders of nose and nasal sinuses: Secondary | ICD-10-CM | POA: Diagnosis not present

## 2019-05-31 DIAGNOSIS — H9192 Unspecified hearing loss, left ear: Secondary | ICD-10-CM | POA: Diagnosis not present

## 2019-06-13 DIAGNOSIS — L03031 Cellulitis of right toe: Secondary | ICD-10-CM | POA: Diagnosis not present

## 2019-07-05 DIAGNOSIS — E038 Other specified hypothyroidism: Secondary | ICD-10-CM | POA: Diagnosis not present

## 2019-08-23 DIAGNOSIS — Z23 Encounter for immunization: Secondary | ICD-10-CM | POA: Diagnosis not present

## 2019-08-24 ENCOUNTER — Other Ambulatory Visit: Payer: Self-pay | Admitting: Internal Medicine

## 2019-08-24 DIAGNOSIS — Z1231 Encounter for screening mammogram for malignant neoplasm of breast: Secondary | ICD-10-CM

## 2019-10-03 ENCOUNTER — Other Ambulatory Visit: Payer: Self-pay

## 2019-10-03 DIAGNOSIS — Z01419 Encounter for gynecological examination (general) (routine) without abnormal findings: Secondary | ICD-10-CM | POA: Diagnosis not present

## 2019-10-03 DIAGNOSIS — Z124 Encounter for screening for malignant neoplasm of cervix: Secondary | ICD-10-CM | POA: Diagnosis not present

## 2019-10-03 DIAGNOSIS — Z20828 Contact with and (suspected) exposure to other viral communicable diseases: Secondary | ICD-10-CM | POA: Diagnosis not present

## 2019-10-03 DIAGNOSIS — Z20822 Contact with and (suspected) exposure to covid-19: Secondary | ICD-10-CM

## 2019-10-04 DIAGNOSIS — M5412 Radiculopathy, cervical region: Secondary | ICD-10-CM | POA: Diagnosis not present

## 2019-10-04 DIAGNOSIS — M999 Biomechanical lesion, unspecified: Secondary | ICD-10-CM | POA: Diagnosis not present

## 2019-10-04 DIAGNOSIS — M519 Unspecified thoracic, thoracolumbar and lumbosacral intervertebral disc disorder: Secondary | ICD-10-CM | POA: Diagnosis not present

## 2019-10-04 DIAGNOSIS — M419 Scoliosis, unspecified: Secondary | ICD-10-CM | POA: Diagnosis not present

## 2019-10-04 DIAGNOSIS — M542 Cervicalgia: Secondary | ICD-10-CM | POA: Diagnosis not present

## 2019-10-05 DIAGNOSIS — H26493 Other secondary cataract, bilateral: Secondary | ICD-10-CM | POA: Diagnosis not present

## 2019-10-05 DIAGNOSIS — H02052 Trichiasis without entropian right lower eyelid: Secondary | ICD-10-CM | POA: Diagnosis not present

## 2019-10-05 LAB — NOVEL CORONAVIRUS, NAA: SARS-CoV-2, NAA: NOT DETECTED

## 2019-10-09 ENCOUNTER — Other Ambulatory Visit: Payer: Self-pay

## 2019-10-09 ENCOUNTER — Ambulatory Visit
Admission: RE | Admit: 2019-10-09 | Discharge: 2019-10-09 | Disposition: A | Discharge status: Home / Self Care | Payer: Medicare Other | Source: Ambulatory Visit | Attending: Internal Medicine | Admitting: Internal Medicine

## 2019-10-09 DIAGNOSIS — Z1231 Encounter for screening mammogram for malignant neoplasm of breast: Secondary | ICD-10-CM

## 2019-10-10 DIAGNOSIS — M542 Cervicalgia: Secondary | ICD-10-CM | POA: Diagnosis not present

## 2019-10-10 DIAGNOSIS — M5412 Radiculopathy, cervical region: Secondary | ICD-10-CM | POA: Diagnosis not present

## 2019-10-23 DIAGNOSIS — T8484XA Pain due to internal orthopedic prosthetic devices, implants and grafts, initial encounter: Secondary | ICD-10-CM | POA: Diagnosis not present

## 2019-10-24 DIAGNOSIS — M542 Cervicalgia: Secondary | ICD-10-CM | POA: Diagnosis not present

## 2019-10-24 DIAGNOSIS — M5412 Radiculopathy, cervical region: Secondary | ICD-10-CM | POA: Diagnosis not present

## 2019-10-31 DIAGNOSIS — M5412 Radiculopathy, cervical region: Secondary | ICD-10-CM | POA: Diagnosis not present

## 2019-10-31 DIAGNOSIS — M542 Cervicalgia: Secondary | ICD-10-CM | POA: Diagnosis not present

## 2019-11-01 DIAGNOSIS — J3489 Other specified disorders of nose and nasal sinuses: Secondary | ICD-10-CM | POA: Diagnosis not present

## 2019-11-01 DIAGNOSIS — Z87891 Personal history of nicotine dependence: Secondary | ICD-10-CM | POA: Diagnosis not present

## 2019-11-01 DIAGNOSIS — H6121 Impacted cerumen, right ear: Secondary | ICD-10-CM | POA: Diagnosis not present

## 2019-11-01 DIAGNOSIS — Z7289 Other problems related to lifestyle: Secondary | ICD-10-CM | POA: Diagnosis not present

## 2020-01-22 DIAGNOSIS — L821 Other seborrheic keratosis: Secondary | ICD-10-CM | POA: Diagnosis not present

## 2020-01-22 DIAGNOSIS — D1801 Hemangioma of skin and subcutaneous tissue: Secondary | ICD-10-CM | POA: Diagnosis not present

## 2020-01-22 DIAGNOSIS — L218 Other seborrheic dermatitis: Secondary | ICD-10-CM | POA: Diagnosis not present

## 2020-01-22 DIAGNOSIS — L309 Dermatitis, unspecified: Secondary | ICD-10-CM | POA: Diagnosis not present

## 2020-01-22 DIAGNOSIS — L570 Actinic keratosis: Secondary | ICD-10-CM | POA: Diagnosis not present

## 2020-02-04 DIAGNOSIS — F419 Anxiety disorder, unspecified: Secondary | ICD-10-CM | POA: Insufficient documentation

## 2020-02-04 DIAGNOSIS — F4321 Adjustment disorder with depressed mood: Secondary | ICD-10-CM | POA: Diagnosis not present

## 2020-02-06 DIAGNOSIS — H00015 Hordeolum externum left lower eyelid: Secondary | ICD-10-CM | POA: Diagnosis not present

## 2020-02-20 DIAGNOSIS — M25512 Pain in left shoulder: Secondary | ICD-10-CM | POA: Diagnosis not present

## 2020-02-21 DIAGNOSIS — H0015 Chalazion left lower eyelid: Secondary | ICD-10-CM | POA: Diagnosis not present

## 2020-03-26 DIAGNOSIS — E038 Other specified hypothyroidism: Secondary | ICD-10-CM | POA: Diagnosis not present

## 2020-03-26 DIAGNOSIS — E7849 Other hyperlipidemia: Secondary | ICD-10-CM | POA: Diagnosis not present

## 2020-03-26 DIAGNOSIS — E559 Vitamin D deficiency, unspecified: Secondary | ICD-10-CM | POA: Diagnosis not present

## 2020-04-01 DIAGNOSIS — M5136 Other intervertebral disc degeneration, lumbar region: Secondary | ICD-10-CM | POA: Diagnosis not present

## 2020-04-01 DIAGNOSIS — R82998 Other abnormal findings in urine: Secondary | ICD-10-CM | POA: Diagnosis not present

## 2020-04-01 DIAGNOSIS — E559 Vitamin D deficiency, unspecified: Secondary | ICD-10-CM | POA: Diagnosis not present

## 2020-04-01 DIAGNOSIS — L299 Pruritus, unspecified: Secondary | ICD-10-CM | POA: Diagnosis not present

## 2020-04-01 DIAGNOSIS — Z1331 Encounter for screening for depression: Secondary | ICD-10-CM | POA: Diagnosis not present

## 2020-04-01 DIAGNOSIS — F4321 Adjustment disorder with depressed mood: Secondary | ICD-10-CM | POA: Diagnosis not present

## 2020-04-01 DIAGNOSIS — Z0000 Encounter for general adult medical examination without abnormal findings: Secondary | ICD-10-CM | POA: Diagnosis not present

## 2020-04-01 DIAGNOSIS — E038 Other specified hypothyroidism: Secondary | ICD-10-CM | POA: Diagnosis not present

## 2020-04-01 DIAGNOSIS — Z1212 Encounter for screening for malignant neoplasm of rectum: Secondary | ICD-10-CM | POA: Diagnosis not present

## 2020-04-01 DIAGNOSIS — I10 Essential (primary) hypertension: Secondary | ICD-10-CM | POA: Diagnosis not present

## 2020-04-01 DIAGNOSIS — E7849 Other hyperlipidemia: Secondary | ICD-10-CM | POA: Diagnosis not present

## 2020-05-01 DIAGNOSIS — L814 Other melanin hyperpigmentation: Secondary | ICD-10-CM | POA: Diagnosis not present

## 2020-05-01 DIAGNOSIS — D1801 Hemangioma of skin and subcutaneous tissue: Secondary | ICD-10-CM | POA: Diagnosis not present

## 2020-05-01 DIAGNOSIS — D2261 Melanocytic nevi of right upper limb, including shoulder: Secondary | ICD-10-CM | POA: Diagnosis not present

## 2020-05-01 DIAGNOSIS — D225 Melanocytic nevi of trunk: Secondary | ICD-10-CM | POA: Diagnosis not present

## 2020-05-01 DIAGNOSIS — L821 Other seborrheic keratosis: Secondary | ICD-10-CM | POA: Diagnosis not present

## 2020-05-01 DIAGNOSIS — D692 Other nonthrombocytopenic purpura: Secondary | ICD-10-CM | POA: Diagnosis not present

## 2020-05-01 DIAGNOSIS — L218 Other seborrheic dermatitis: Secondary | ICD-10-CM | POA: Diagnosis not present

## 2020-07-16 IMAGING — MG DIGITAL SCREENING BILAT W/ TOMO W/ CAD
8 series · 8 of 24 positions shown · non-contrast
Comparison: Previous exam(s).

CLINICAL DATA: Screening.

EXAM:
DIGITAL SCREENING BILATERAL MAMMOGRAM WITH TOMO AND CAD

[R CC synth-2D]
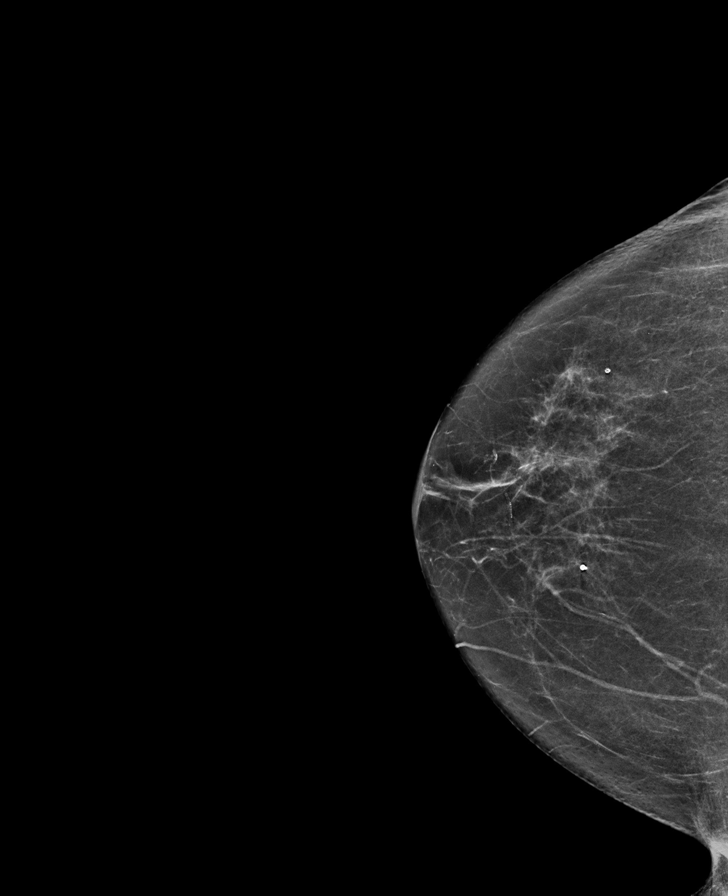

[L MLO synth-2D]
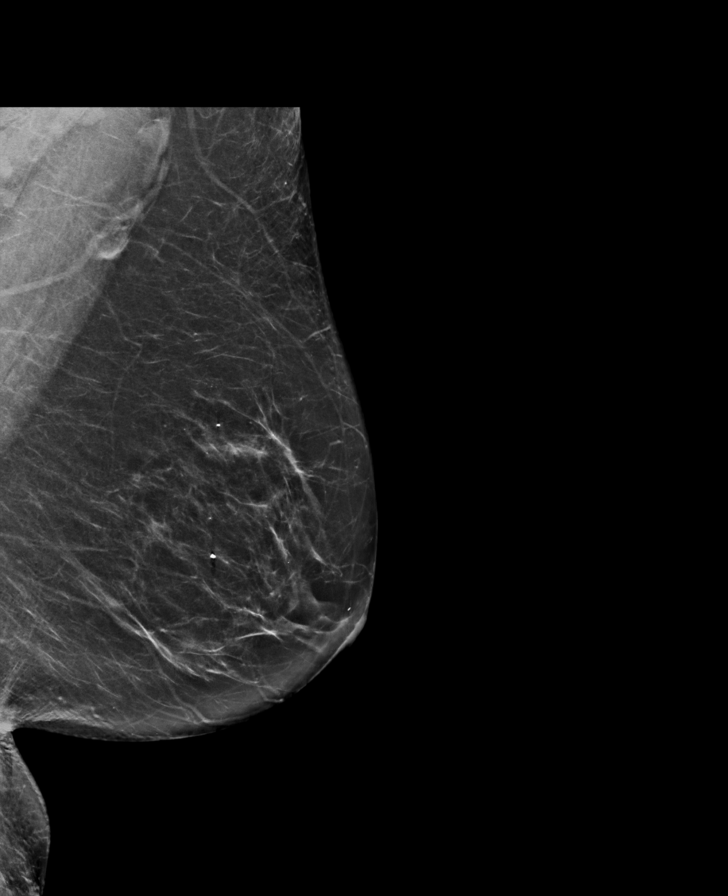

[L CC synth-2D]
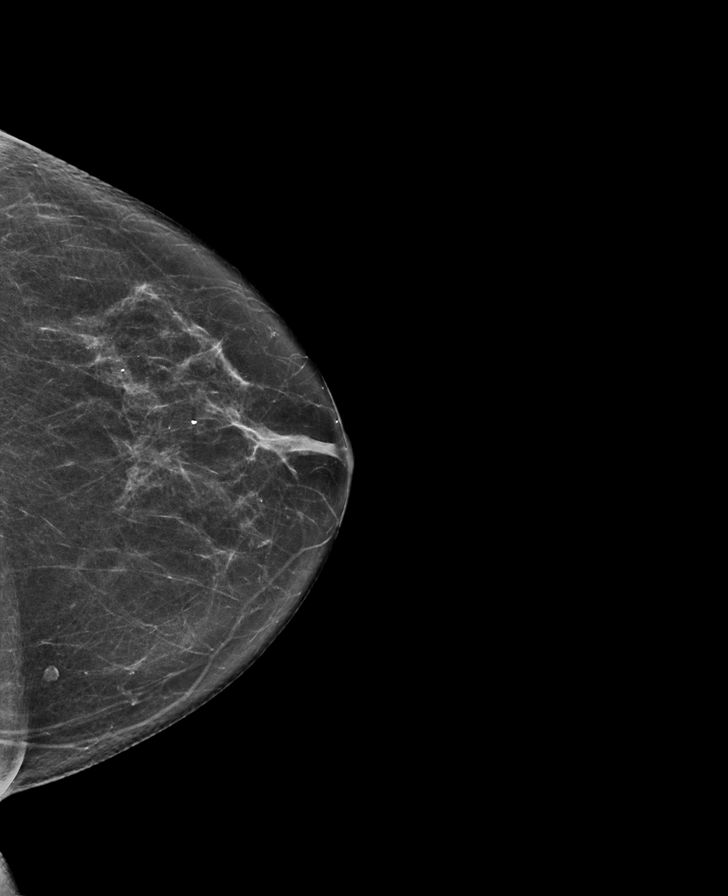

[R MLO synth-2D]
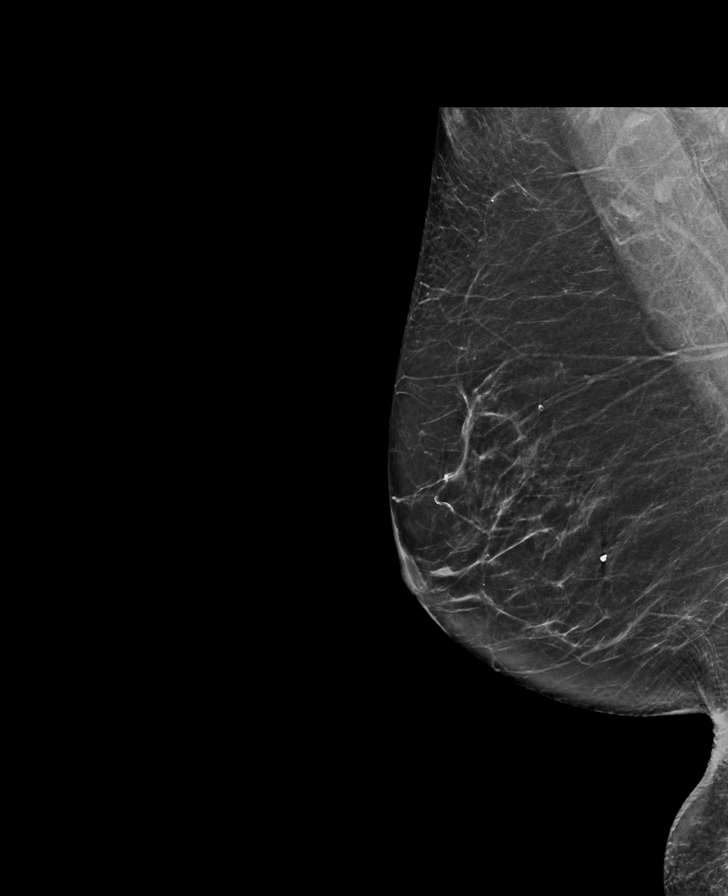

[R CC tomo · tomo slice 33/65.0]
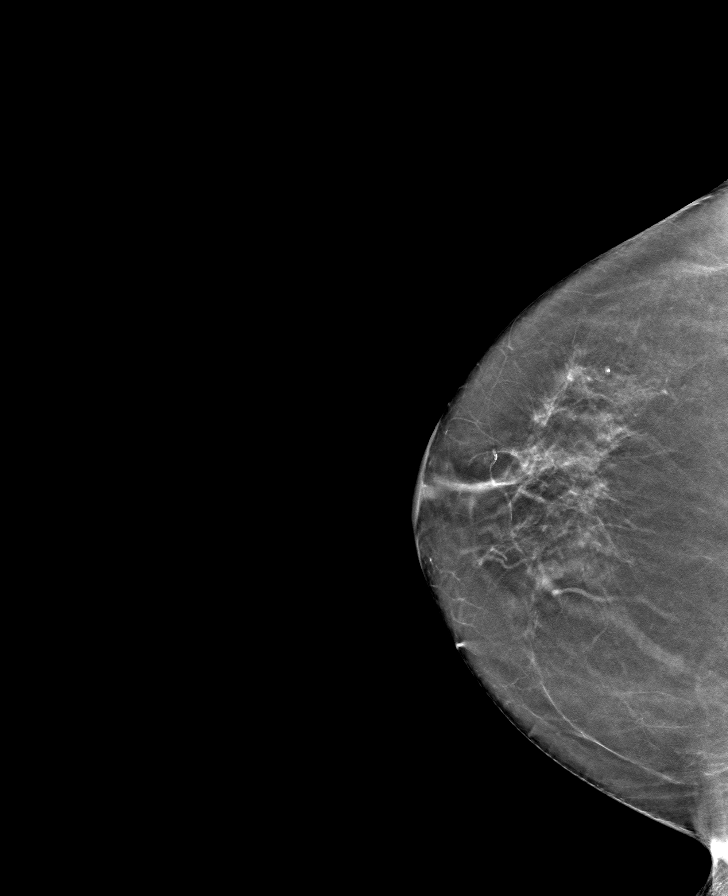

[L CC tomo · tomo slice 34/67.0]
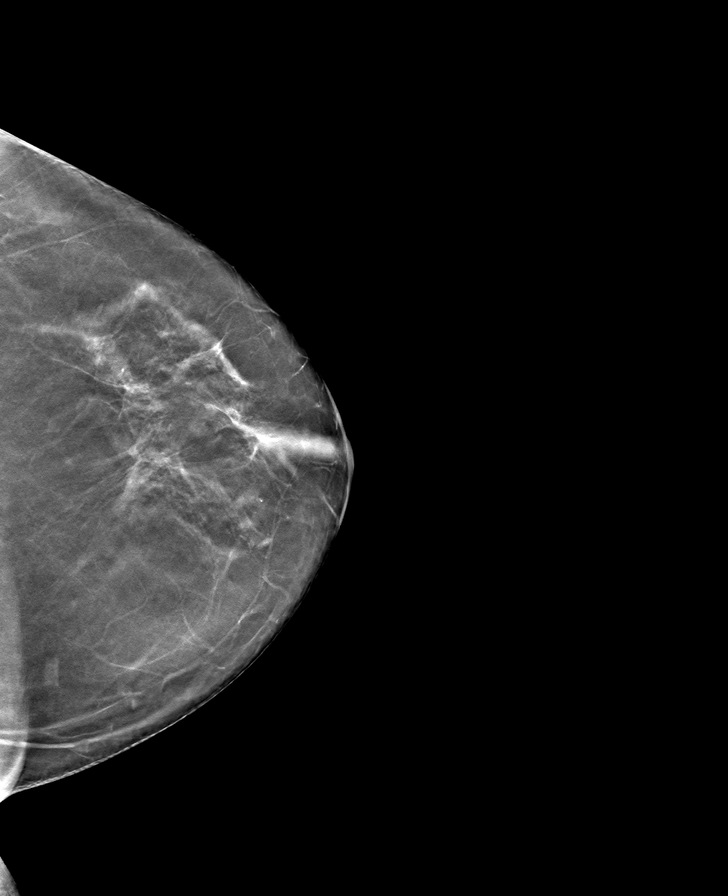

[R MLO tomo · tomo slice 35/69.0]
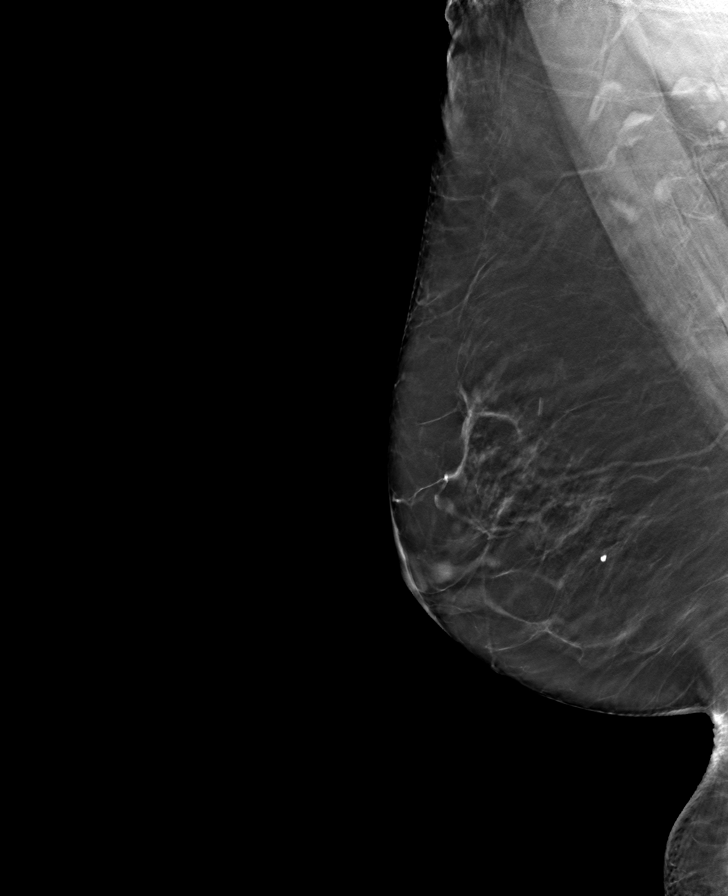

[L MLO tomo · tomo slice 38/75.0]
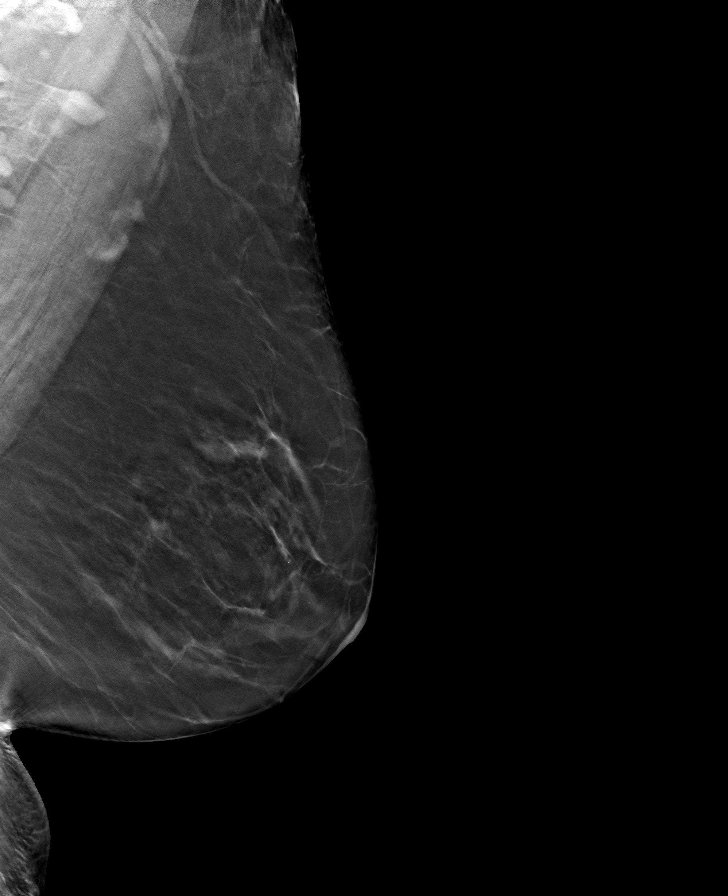

[8 of 24 positions shown; findings below may reference images not displayed]

ACR Breast Density Category b: There are scattered areas of
fibroglandular density.
FINDINGS: There are no findings suspicious for malignancy. Images were
processed with CAD.
IMPRESSION: No mammographic evidence of malignancy. A result letter of this
screening mammogram will be mailed directly to the patient.

RECOMMENDATION:
Screening mammogram in one year. (Code:[id number])

BI-RADS CATEGORY  1: Negative.

## 2020-08-07 DIAGNOSIS — L03031 Cellulitis of right toe: Secondary | ICD-10-CM | POA: Diagnosis not present

## 2020-08-21 DIAGNOSIS — L218 Other seborrheic dermatitis: Secondary | ICD-10-CM | POA: Diagnosis not present

## 2020-08-21 DIAGNOSIS — L570 Actinic keratosis: Secondary | ICD-10-CM | POA: Diagnosis not present

## 2020-08-21 DIAGNOSIS — L03031 Cellulitis of right toe: Secondary | ICD-10-CM | POA: Diagnosis not present

## 2020-08-21 DIAGNOSIS — L814 Other melanin hyperpigmentation: Secondary | ICD-10-CM | POA: Diagnosis not present

## 2020-08-21 DIAGNOSIS — D692 Other nonthrombocytopenic purpura: Secondary | ICD-10-CM | POA: Diagnosis not present

## 2020-08-21 DIAGNOSIS — D485 Neoplasm of uncertain behavior of skin: Secondary | ICD-10-CM | POA: Diagnosis not present

## 2020-09-12 ENCOUNTER — Other Ambulatory Visit: Payer: Self-pay | Admitting: Internal Medicine

## 2020-09-12 DIAGNOSIS — Z1231 Encounter for screening mammogram for malignant neoplasm of breast: Secondary | ICD-10-CM

## 2020-09-27 DIAGNOSIS — Z23 Encounter for immunization: Secondary | ICD-10-CM | POA: Diagnosis not present

## 2020-10-09 ENCOUNTER — Ambulatory Visit
Admission: RE | Admit: 2020-10-09 | Discharge: 2020-10-09 | Disposition: A | Discharge status: Home / Self Care | Payer: Medicare Other | Source: Ambulatory Visit | Attending: Internal Medicine | Admitting: Internal Medicine

## 2020-10-09 ENCOUNTER — Other Ambulatory Visit: Payer: Self-pay

## 2020-10-09 DIAGNOSIS — Z1231 Encounter for screening mammogram for malignant neoplasm of breast: Secondary | ICD-10-CM

## 2020-11-03 DIAGNOSIS — S81809A Unspecified open wound, unspecified lower leg, initial encounter: Secondary | ICD-10-CM | POA: Diagnosis not present

## 2020-11-10 DIAGNOSIS — S81802D Unspecified open wound, left lower leg, subsequent encounter: Secondary | ICD-10-CM | POA: Diagnosis not present

## 2020-11-17 DIAGNOSIS — S81802D Unspecified open wound, left lower leg, subsequent encounter: Secondary | ICD-10-CM | POA: Diagnosis not present

## 2021-01-20 DIAGNOSIS — H43813 Vitreous degeneration, bilateral: Secondary | ICD-10-CM | POA: Diagnosis not present

## 2021-01-20 DIAGNOSIS — H524 Presbyopia: Secondary | ICD-10-CM | POA: Diagnosis not present

## 2021-01-20 DIAGNOSIS — H02052 Trichiasis without entropian right lower eyelid: Secondary | ICD-10-CM | POA: Diagnosis not present

## 2021-01-20 DIAGNOSIS — H26493 Other secondary cataract, bilateral: Secondary | ICD-10-CM | POA: Diagnosis not present

## 2021-02-04 DIAGNOSIS — Z20822 Contact with and (suspected) exposure to covid-19: Secondary | ICD-10-CM | POA: Diagnosis not present

## 2021-03-31 DIAGNOSIS — E785 Hyperlipidemia, unspecified: Secondary | ICD-10-CM | POA: Diagnosis not present

## 2021-03-31 DIAGNOSIS — E039 Hypothyroidism, unspecified: Secondary | ICD-10-CM | POA: Diagnosis not present

## 2021-03-31 DIAGNOSIS — E559 Vitamin D deficiency, unspecified: Secondary | ICD-10-CM | POA: Diagnosis not present

## 2021-04-07 DIAGNOSIS — R0600 Dyspnea, unspecified: Secondary | ICD-10-CM | POA: Diagnosis not present

## 2021-04-07 DIAGNOSIS — I10 Essential (primary) hypertension: Secondary | ICD-10-CM | POA: Diagnosis not present

## 2021-04-07 DIAGNOSIS — E039 Hypothyroidism, unspecified: Secondary | ICD-10-CM | POA: Diagnosis not present

## 2021-04-07 DIAGNOSIS — Z0000 Encounter for general adult medical examination without abnormal findings: Secondary | ICD-10-CM | POA: Diagnosis not present

## 2021-04-07 DIAGNOSIS — E559 Vitamin D deficiency, unspecified: Secondary | ICD-10-CM | POA: Diagnosis not present

## 2021-04-07 DIAGNOSIS — R82998 Other abnormal findings in urine: Secondary | ICD-10-CM | POA: Diagnosis not present

## 2021-04-07 DIAGNOSIS — Z1331 Encounter for screening for depression: Secondary | ICD-10-CM | POA: Diagnosis not present

## 2021-04-07 DIAGNOSIS — M5136 Other intervertebral disc degeneration, lumbar region: Secondary | ICD-10-CM | POA: Diagnosis not present

## 2021-04-07 DIAGNOSIS — M25561 Pain in right knee: Secondary | ICD-10-CM | POA: Diagnosis not present

## 2021-04-07 DIAGNOSIS — E785 Hyperlipidemia, unspecified: Secondary | ICD-10-CM | POA: Diagnosis not present

## 2021-04-07 DIAGNOSIS — Z1339 Encounter for screening examination for other mental health and behavioral disorders: Secondary | ICD-10-CM | POA: Diagnosis not present

## 2021-04-14 DIAGNOSIS — Z1212 Encounter for screening for malignant neoplasm of rectum: Secondary | ICD-10-CM | POA: Diagnosis not present

## 2021-04-22 DIAGNOSIS — L600 Ingrowing nail: Secondary | ICD-10-CM | POA: Diagnosis not present

## 2021-05-01 DIAGNOSIS — D1801 Hemangioma of skin and subcutaneous tissue: Secondary | ICD-10-CM | POA: Diagnosis not present

## 2021-05-01 DIAGNOSIS — L218 Other seborrheic dermatitis: Secondary | ICD-10-CM | POA: Diagnosis not present

## 2021-05-01 DIAGNOSIS — D225 Melanocytic nevi of trunk: Secondary | ICD-10-CM | POA: Diagnosis not present

## 2021-05-01 DIAGNOSIS — L570 Actinic keratosis: Secondary | ICD-10-CM | POA: Diagnosis not present

## 2021-05-01 DIAGNOSIS — L821 Other seborrheic keratosis: Secondary | ICD-10-CM | POA: Diagnosis not present

## 2021-06-02 DIAGNOSIS — Z20822 Contact with and (suspected) exposure to covid-19: Secondary | ICD-10-CM | POA: Diagnosis not present

## 2021-07-24 DIAGNOSIS — M545 Low back pain, unspecified: Secondary | ICD-10-CM | POA: Diagnosis not present

## 2021-07-24 DIAGNOSIS — M5136 Other intervertebral disc degeneration, lumbar region: Secondary | ICD-10-CM | POA: Diagnosis not present

## 2021-07-29 DIAGNOSIS — M545 Low back pain, unspecified: Secondary | ICD-10-CM | POA: Diagnosis not present

## 2021-08-12 DIAGNOSIS — M5459 Other low back pain: Secondary | ICD-10-CM | POA: Diagnosis not present

## 2021-08-12 DIAGNOSIS — M545 Low back pain, unspecified: Secondary | ICD-10-CM | POA: Diagnosis not present

## 2021-08-14 ENCOUNTER — Other Ambulatory Visit: Payer: Self-pay | Admitting: Specialist

## 2021-08-14 DIAGNOSIS — M545 Low back pain, unspecified: Secondary | ICD-10-CM

## 2021-08-14 DIAGNOSIS — G8929 Other chronic pain: Secondary | ICD-10-CM

## 2021-08-17 ENCOUNTER — Ambulatory Visit
Admission: RE | Admit: 2021-08-17 | Discharge: 2021-08-17 | Disposition: A | Discharge status: Home / Self Care | Payer: Medicare Other | Source: Ambulatory Visit | Attending: Specialist | Admitting: Specialist

## 2021-08-17 ENCOUNTER — Other Ambulatory Visit: Payer: Self-pay | Admitting: Specialist

## 2021-08-17 DIAGNOSIS — M545 Low back pain, unspecified: Secondary | ICD-10-CM

## 2021-08-17 DIAGNOSIS — M47817 Spondylosis without myelopathy or radiculopathy, lumbosacral region: Secondary | ICD-10-CM | POA: Diagnosis not present

## 2021-08-17 DIAGNOSIS — G8929 Other chronic pain: Secondary | ICD-10-CM

## 2021-08-17 DIAGNOSIS — M5136 Other intervertebral disc degeneration, lumbar region: Secondary | ICD-10-CM | POA: Diagnosis not present

## 2021-08-17 MED ORDER — IOPAMIDOL (ISOVUE-M 200) INJECTION 41%
1.0000 mL | Freq: Once | INTRAMUSCULAR | Status: AC
  Administered 2021-08-17: 1 mL via EPIDURAL

## 2021-08-17 MED ORDER — METHYLPREDNISOLONE ACETATE 40 MG/ML INJ SUSP (RADIOLOG
80.0000 mg | Freq: Once | INTRAMUSCULAR | Status: AC
  Administered 2021-08-17: 80 mg via EPIDURAL

## 2021-08-17 NOTE — Discharge Instructions (Signed)

## 2021-08-18 DIAGNOSIS — M545 Low back pain, unspecified: Secondary | ICD-10-CM | POA: Diagnosis not present

## 2021-08-19 ENCOUNTER — Other Ambulatory Visit: Payer: Self-pay | Admitting: Specialist

## 2021-08-26 DIAGNOSIS — M545 Low back pain, unspecified: Secondary | ICD-10-CM | POA: Diagnosis not present

## 2021-08-28 DIAGNOSIS — Z20822 Contact with and (suspected) exposure to covid-19: Secondary | ICD-10-CM | POA: Diagnosis not present

## 2021-09-02 DIAGNOSIS — M545 Low back pain, unspecified: Secondary | ICD-10-CM | POA: Diagnosis not present

## 2021-09-09 ENCOUNTER — Other Ambulatory Visit: Payer: Self-pay | Admitting: Internal Medicine

## 2021-09-09 DIAGNOSIS — Z1231 Encounter for screening mammogram for malignant neoplasm of breast: Secondary | ICD-10-CM

## 2021-09-10 DIAGNOSIS — M5416 Radiculopathy, lumbar region: Secondary | ICD-10-CM | POA: Diagnosis not present

## 2021-09-17 DIAGNOSIS — M5459 Other low back pain: Secondary | ICD-10-CM | POA: Diagnosis not present

## 2021-09-21 ENCOUNTER — Other Ambulatory Visit: Payer: Self-pay | Admitting: Specialist

## 2021-09-21 DIAGNOSIS — G8929 Other chronic pain: Secondary | ICD-10-CM

## 2021-09-21 DIAGNOSIS — M545 Low back pain, unspecified: Secondary | ICD-10-CM

## 2021-09-22 ENCOUNTER — Ambulatory Visit
Admission: RE | Admit: 2021-09-22 | Discharge: 2021-09-22 | Disposition: A | Discharge status: Home / Self Care | Payer: Medicare Other | Source: Ambulatory Visit | Attending: Specialist | Admitting: Specialist

## 2021-09-22 ENCOUNTER — Other Ambulatory Visit: Payer: Self-pay

## 2021-09-22 ENCOUNTER — Other Ambulatory Visit: Payer: Self-pay | Admitting: Specialist

## 2021-09-22 ENCOUNTER — Other Ambulatory Visit: Payer: Medicare Other

## 2021-09-22 DIAGNOSIS — M545 Low back pain, unspecified: Secondary | ICD-10-CM

## 2021-09-22 DIAGNOSIS — M47817 Spondylosis without myelopathy or radiculopathy, lumbosacral region: Secondary | ICD-10-CM | POA: Diagnosis not present

## 2021-09-22 DIAGNOSIS — G8929 Other chronic pain: Secondary | ICD-10-CM

## 2021-09-22 MED ORDER — METHYLPREDNISOLONE ACETATE 40 MG/ML INJ SUSP (RADIOLOG
80.0000 mg | Freq: Once | INTRAMUSCULAR | Status: AC
  Administered 2021-09-22: 80 mg via EPIDURAL

## 2021-09-22 MED ORDER — IOPAMIDOL (ISOVUE-M 200) INJECTION 41%
1.0000 mL | Freq: Once | INTRAMUSCULAR | Status: AC
  Administered 2021-09-22: 1 mL via EPIDURAL

## 2021-09-22 NOTE — Discharge Instructions (Signed)

## 2021-09-30 DIAGNOSIS — M5136 Other intervertebral disc degeneration, lumbar region: Secondary | ICD-10-CM | POA: Diagnosis not present

## 2021-09-30 DIAGNOSIS — M25552 Pain in left hip: Secondary | ICD-10-CM | POA: Diagnosis not present

## 2021-10-06 DIAGNOSIS — M5414 Radiculopathy, thoracic region: Secondary | ICD-10-CM | POA: Diagnosis not present

## 2021-10-12 ENCOUNTER — Other Ambulatory Visit: Payer: Self-pay

## 2021-10-12 ENCOUNTER — Ambulatory Visit
Admission: RE | Admit: 2021-10-12 | Discharge: 2021-10-12 | Disposition: A | Discharge status: Home / Self Care | Payer: Medicare Other | Source: Ambulatory Visit | Attending: Internal Medicine | Admitting: Internal Medicine

## 2021-10-12 DIAGNOSIS — Z1231 Encounter for screening mammogram for malignant neoplasm of breast: Secondary | ICD-10-CM

## 2021-10-15 DIAGNOSIS — M5432 Sciatica, left side: Secondary | ICD-10-CM | POA: Diagnosis not present

## 2021-10-20 DIAGNOSIS — M5416 Radiculopathy, lumbar region: Secondary | ICD-10-CM | POA: Diagnosis not present

## 2021-10-21 ENCOUNTER — Other Ambulatory Visit: Payer: Self-pay | Admitting: Specialist

## 2021-10-21 DIAGNOSIS — M5416 Radiculopathy, lumbar region: Secondary | ICD-10-CM

## 2021-10-22 DIAGNOSIS — Z01419 Encounter for gynecological examination (general) (routine) without abnormal findings: Secondary | ICD-10-CM | POA: Diagnosis not present

## 2021-10-22 DIAGNOSIS — M5432 Sciatica, left side: Secondary | ICD-10-CM | POA: Diagnosis not present

## 2021-10-27 ENCOUNTER — Other Ambulatory Visit: Payer: Self-pay | Admitting: Specialist

## 2021-10-27 ENCOUNTER — Ambulatory Visit
Admission: RE | Admit: 2021-10-27 | Discharge: 2021-10-27 | Disposition: A | Discharge status: Home / Self Care | Payer: Self-pay | Source: Ambulatory Visit | Attending: Specialist | Admitting: Specialist

## 2021-10-27 DIAGNOSIS — M5416 Radiculopathy, lumbar region: Secondary | ICD-10-CM

## 2021-10-28 ENCOUNTER — Ambulatory Visit
Admission: RE | Admit: 2021-10-28 | Discharge: 2021-10-28 | Disposition: A | Discharge status: Home / Self Care | Payer: Medicare Other | Source: Ambulatory Visit | Attending: Specialist | Admitting: Specialist

## 2021-10-28 DIAGNOSIS — M4326 Fusion of spine, lumbar region: Secondary | ICD-10-CM | POA: Diagnosis not present

## 2021-10-28 DIAGNOSIS — M5416 Radiculopathy, lumbar region: Secondary | ICD-10-CM

## 2021-10-28 DIAGNOSIS — M5116 Intervertebral disc disorders with radiculopathy, lumbar region: Secondary | ICD-10-CM | POA: Diagnosis not present

## 2021-10-28 DIAGNOSIS — M47816 Spondylosis without myelopathy or radiculopathy, lumbar region: Secondary | ICD-10-CM | POA: Diagnosis not present

## 2021-10-28 MED ORDER — IOPAMIDOL (ISOVUE-M 200) INJECTION 41%
15.0000 mL | Freq: Once | INTRAMUSCULAR | Status: AC
  Administered 2021-10-28: 15 mL via INTRATHECAL

## 2021-10-28 MED ORDER — MEPERIDINE HCL 50 MG/ML IJ SOLN
50.0000 mg | Freq: Once | INTRAMUSCULAR | Status: AC | PRN
  Administered 2021-10-28: 50 mg via INTRAMUSCULAR

## 2021-10-28 MED ORDER — ONDANSETRON HCL 4 MG/2ML IJ SOLN
4.0000 mg | Freq: Once | INTRAMUSCULAR | Status: AC | PRN
  Administered 2021-10-28: 4 mg via INTRAMUSCULAR

## 2021-10-28 MED ORDER — DIAZEPAM 5 MG PO TABS
5.0000 mg | ORAL_TABLET | Freq: Once | ORAL | Status: AC
  Administered 2021-10-28: 5 mg via ORAL

## 2021-10-28 NOTE — Discharge Instr - Other Orders (Addendum)
8832: 8/10 pain reported post myelogram procedure, see MAR 1015: Relief reported, pain 1/10.

## 2021-10-28 NOTE — Discharge Instructions (Signed)

## 2021-11-13 DIAGNOSIS — M5442 Lumbago with sciatica, left side: Secondary | ICD-10-CM | POA: Diagnosis not present

## 2021-11-13 DIAGNOSIS — M4150 Other secondary scoliosis, site unspecified: Secondary | ICD-10-CM | POA: Diagnosis not present

## 2021-11-13 DIAGNOSIS — G8929 Other chronic pain: Secondary | ICD-10-CM | POA: Diagnosis not present

## 2021-11-13 DIAGNOSIS — M5136 Other intervertebral disc degeneration, lumbar region: Secondary | ICD-10-CM | POA: Diagnosis not present

## 2021-11-13 DIAGNOSIS — M5441 Lumbago with sciatica, right side: Secondary | ICD-10-CM | POA: Diagnosis not present

## 2021-11-25 DIAGNOSIS — M5416 Radiculopathy, lumbar region: Secondary | ICD-10-CM | POA: Diagnosis not present

## 2021-12-01 DIAGNOSIS — M5136 Other intervertebral disc degeneration, lumbar region: Secondary | ICD-10-CM | POA: Diagnosis not present

## 2021-12-01 DIAGNOSIS — M5441 Lumbago with sciatica, right side: Secondary | ICD-10-CM | POA: Diagnosis not present

## 2021-12-01 DIAGNOSIS — R29898 Other symptoms and signs involving the musculoskeletal system: Secondary | ICD-10-CM | POA: Diagnosis not present

## 2021-12-02 DIAGNOSIS — M5416 Radiculopathy, lumbar region: Secondary | ICD-10-CM | POA: Diagnosis not present

## 2021-12-02 DIAGNOSIS — M5459 Other low back pain: Secondary | ICD-10-CM | POA: Diagnosis not present

## 2021-12-04 DIAGNOSIS — Z6824 Body mass index (BMI) 24.0-24.9, adult: Secondary | ICD-10-CM | POA: Diagnosis not present

## 2021-12-04 DIAGNOSIS — M48062 Spinal stenosis, lumbar region with neurogenic claudication: Secondary | ICD-10-CM | POA: Diagnosis not present

## 2021-12-04 DIAGNOSIS — M5442 Lumbago with sciatica, left side: Secondary | ICD-10-CM | POA: Diagnosis not present

## 2021-12-04 DIAGNOSIS — R030 Elevated blood-pressure reading, without diagnosis of hypertension: Secondary | ICD-10-CM | POA: Diagnosis not present

## 2021-12-07 DIAGNOSIS — G5731 Lesion of lateral popliteal nerve, right lower limb: Secondary | ICD-10-CM | POA: Diagnosis not present

## 2021-12-09 DIAGNOSIS — M5416 Radiculopathy, lumbar region: Secondary | ICD-10-CM | POA: Diagnosis not present

## 2021-12-16 DIAGNOSIS — M5416 Radiculopathy, lumbar region: Secondary | ICD-10-CM | POA: Diagnosis not present

## 2021-12-17 DIAGNOSIS — G5731 Lesion of lateral popliteal nerve, right lower limb: Secondary | ICD-10-CM | POA: Diagnosis not present

## 2021-12-23 DIAGNOSIS — M5416 Radiculopathy, lumbar region: Secondary | ICD-10-CM | POA: Diagnosis not present

## 2021-12-27 HISTORY — PX: BACK SURGERY: SHX140

## 2022-01-06 DIAGNOSIS — M5416 Radiculopathy, lumbar region: Secondary | ICD-10-CM | POA: Diagnosis not present

## 2022-01-07 DIAGNOSIS — M5134 Other intervertebral disc degeneration, thoracic region: Secondary | ICD-10-CM | POA: Diagnosis not present

## 2022-01-07 DIAGNOSIS — M6389 Disorders of muscle in diseases classified elsewhere, multiple sites: Secondary | ICD-10-CM | POA: Diagnosis not present

## 2022-01-07 DIAGNOSIS — M5416 Radiculopathy, lumbar region: Secondary | ICD-10-CM | POA: Diagnosis not present

## 2022-01-07 DIAGNOSIS — M5137 Other intervertebral disc degeneration, lumbosacral region: Secondary | ICD-10-CM | POA: Diagnosis not present

## 2022-01-07 DIAGNOSIS — M5459 Other low back pain: Secondary | ICD-10-CM | POA: Diagnosis not present

## 2022-01-07 DIAGNOSIS — R278 Other lack of coordination: Secondary | ICD-10-CM | POA: Diagnosis not present

## 2022-01-08 DIAGNOSIS — Z1152 Encounter for screening for COVID-19: Secondary | ICD-10-CM | POA: Diagnosis not present

## 2022-01-08 DIAGNOSIS — J0110 Acute frontal sinusitis, unspecified: Secondary | ICD-10-CM | POA: Diagnosis not present

## 2022-01-08 DIAGNOSIS — R0981 Nasal congestion: Secondary | ICD-10-CM | POA: Diagnosis not present

## 2022-01-08 DIAGNOSIS — R519 Headache, unspecified: Secondary | ICD-10-CM | POA: Diagnosis not present

## 2022-01-08 DIAGNOSIS — R5383 Other fatigue: Secondary | ICD-10-CM | POA: Diagnosis not present

## 2022-01-08 DIAGNOSIS — I10 Essential (primary) hypertension: Secondary | ICD-10-CM | POA: Diagnosis not present

## 2022-01-08 DIAGNOSIS — J029 Acute pharyngitis, unspecified: Secondary | ICD-10-CM | POA: Diagnosis not present

## 2022-01-12 DIAGNOSIS — M5416 Radiculopathy, lumbar region: Secondary | ICD-10-CM | POA: Diagnosis not present

## 2022-01-12 DIAGNOSIS — M6389 Disorders of muscle in diseases classified elsewhere, multiple sites: Secondary | ICD-10-CM | POA: Diagnosis not present

## 2022-01-12 DIAGNOSIS — R278 Other lack of coordination: Secondary | ICD-10-CM | POA: Diagnosis not present

## 2022-01-12 DIAGNOSIS — M5134 Other intervertebral disc degeneration, thoracic region: Secondary | ICD-10-CM | POA: Diagnosis not present

## 2022-01-12 DIAGNOSIS — M5459 Other low back pain: Secondary | ICD-10-CM | POA: Diagnosis not present

## 2022-01-12 DIAGNOSIS — M5137 Other intervertebral disc degeneration, lumbosacral region: Secondary | ICD-10-CM | POA: Diagnosis not present

## 2022-01-13 DIAGNOSIS — M5459 Other low back pain: Secondary | ICD-10-CM | POA: Diagnosis not present

## 2022-01-13 DIAGNOSIS — M5137 Other intervertebral disc degeneration, lumbosacral region: Secondary | ICD-10-CM | POA: Diagnosis not present

## 2022-01-13 DIAGNOSIS — M6389 Disorders of muscle in diseases classified elsewhere, multiple sites: Secondary | ICD-10-CM | POA: Diagnosis not present

## 2022-01-13 DIAGNOSIS — M5134 Other intervertebral disc degeneration, thoracic region: Secondary | ICD-10-CM | POA: Diagnosis not present

## 2022-01-13 DIAGNOSIS — R278 Other lack of coordination: Secondary | ICD-10-CM | POA: Diagnosis not present

## 2022-01-13 DIAGNOSIS — M5416 Radiculopathy, lumbar region: Secondary | ICD-10-CM | POA: Diagnosis not present

## 2022-01-15 DIAGNOSIS — M6389 Disorders of muscle in diseases classified elsewhere, multiple sites: Secondary | ICD-10-CM | POA: Diagnosis not present

## 2022-01-15 DIAGNOSIS — M5134 Other intervertebral disc degeneration, thoracic region: Secondary | ICD-10-CM | POA: Diagnosis not present

## 2022-01-15 DIAGNOSIS — M5137 Other intervertebral disc degeneration, lumbosacral region: Secondary | ICD-10-CM | POA: Diagnosis not present

## 2022-01-15 DIAGNOSIS — M5459 Other low back pain: Secondary | ICD-10-CM | POA: Diagnosis not present

## 2022-01-15 DIAGNOSIS — R278 Other lack of coordination: Secondary | ICD-10-CM | POA: Diagnosis not present

## 2022-01-15 DIAGNOSIS — M5416 Radiculopathy, lumbar region: Secondary | ICD-10-CM | POA: Diagnosis not present

## 2022-01-19 DIAGNOSIS — R278 Other lack of coordination: Secondary | ICD-10-CM | POA: Diagnosis not present

## 2022-01-19 DIAGNOSIS — M5137 Other intervertebral disc degeneration, lumbosacral region: Secondary | ICD-10-CM | POA: Diagnosis not present

## 2022-01-19 DIAGNOSIS — M5416 Radiculopathy, lumbar region: Secondary | ICD-10-CM | POA: Diagnosis not present

## 2022-01-19 DIAGNOSIS — M5459 Other low back pain: Secondary | ICD-10-CM | POA: Diagnosis not present

## 2022-01-19 DIAGNOSIS — M6389 Disorders of muscle in diseases classified elsewhere, multiple sites: Secondary | ICD-10-CM | POA: Diagnosis not present

## 2022-01-19 DIAGNOSIS — M5134 Other intervertebral disc degeneration, thoracic region: Secondary | ICD-10-CM | POA: Diagnosis not present

## 2022-01-20 DIAGNOSIS — R278 Other lack of coordination: Secondary | ICD-10-CM | POA: Diagnosis not present

## 2022-01-20 DIAGNOSIS — M6389 Disorders of muscle in diseases classified elsewhere, multiple sites: Secondary | ICD-10-CM | POA: Diagnosis not present

## 2022-01-20 DIAGNOSIS — M5459 Other low back pain: Secondary | ICD-10-CM | POA: Diagnosis not present

## 2022-01-20 DIAGNOSIS — M5137 Other intervertebral disc degeneration, lumbosacral region: Secondary | ICD-10-CM | POA: Diagnosis not present

## 2022-01-20 DIAGNOSIS — M5416 Radiculopathy, lumbar region: Secondary | ICD-10-CM | POA: Diagnosis not present

## 2022-01-20 DIAGNOSIS — M5134 Other intervertebral disc degeneration, thoracic region: Secondary | ICD-10-CM | POA: Diagnosis not present

## 2022-01-21 DIAGNOSIS — M5459 Other low back pain: Secondary | ICD-10-CM | POA: Diagnosis not present

## 2022-01-21 DIAGNOSIS — R278 Other lack of coordination: Secondary | ICD-10-CM | POA: Diagnosis not present

## 2022-01-21 DIAGNOSIS — M5134 Other intervertebral disc degeneration, thoracic region: Secondary | ICD-10-CM | POA: Diagnosis not present

## 2022-01-21 DIAGNOSIS — M5416 Radiculopathy, lumbar region: Secondary | ICD-10-CM | POA: Diagnosis not present

## 2022-01-21 DIAGNOSIS — M5137 Other intervertebral disc degeneration, lumbosacral region: Secondary | ICD-10-CM | POA: Diagnosis not present

## 2022-01-21 DIAGNOSIS — M6389 Disorders of muscle in diseases classified elsewhere, multiple sites: Secondary | ICD-10-CM | POA: Diagnosis not present

## 2022-01-22 DIAGNOSIS — M48062 Spinal stenosis, lumbar region with neurogenic claudication: Secondary | ICD-10-CM | POA: Diagnosis not present

## 2022-01-25 DIAGNOSIS — M5416 Radiculopathy, lumbar region: Secondary | ICD-10-CM | POA: Diagnosis not present

## 2022-01-25 DIAGNOSIS — M6389 Disorders of muscle in diseases classified elsewhere, multiple sites: Secondary | ICD-10-CM | POA: Diagnosis not present

## 2022-01-25 DIAGNOSIS — M5137 Other intervertebral disc degeneration, lumbosacral region: Secondary | ICD-10-CM | POA: Diagnosis not present

## 2022-01-25 DIAGNOSIS — R278 Other lack of coordination: Secondary | ICD-10-CM | POA: Diagnosis not present

## 2022-01-25 DIAGNOSIS — M5459 Other low back pain: Secondary | ICD-10-CM | POA: Diagnosis not present

## 2022-01-25 DIAGNOSIS — M5134 Other intervertebral disc degeneration, thoracic region: Secondary | ICD-10-CM | POA: Diagnosis not present

## 2022-02-25 DIAGNOSIS — M6389 Disorders of muscle in diseases classified elsewhere, multiple sites: Secondary | ICD-10-CM | POA: Diagnosis not present

## 2022-02-25 DIAGNOSIS — M5459 Other low back pain: Secondary | ICD-10-CM | POA: Diagnosis not present

## 2022-02-25 DIAGNOSIS — M5134 Other intervertebral disc degeneration, thoracic region: Secondary | ICD-10-CM | POA: Diagnosis not present

## 2022-02-25 DIAGNOSIS — R278 Other lack of coordination: Secondary | ICD-10-CM | POA: Diagnosis not present

## 2022-02-25 DIAGNOSIS — M5416 Radiculopathy, lumbar region: Secondary | ICD-10-CM | POA: Diagnosis not present

## 2022-02-25 DIAGNOSIS — M5137 Other intervertebral disc degeneration, lumbosacral region: Secondary | ICD-10-CM | POA: Diagnosis not present

## 2022-03-02 DIAGNOSIS — M549 Dorsalgia, unspecified: Secondary | ICD-10-CM | POA: Diagnosis not present

## 2022-03-02 DIAGNOSIS — M4150 Other secondary scoliosis, site unspecified: Secondary | ICD-10-CM | POA: Diagnosis not present

## 2022-03-02 DIAGNOSIS — M9973 Connective tissue and disc stenosis of intervertebral foramina of lumbar region: Secondary | ICD-10-CM | POA: Diagnosis not present

## 2022-03-02 DIAGNOSIS — Z20822 Contact with and (suspected) exposure to covid-19: Secondary | ICD-10-CM | POA: Diagnosis not present

## 2022-03-02 DIAGNOSIS — Z981 Arthrodesis status: Secondary | ICD-10-CM | POA: Diagnosis not present

## 2022-03-02 DIAGNOSIS — M9972 Connective tissue and disc stenosis of intervertebral foramina of thoracic region: Secondary | ICD-10-CM | POA: Diagnosis not present

## 2022-03-02 DIAGNOSIS — M21371 Foot drop, right foot: Secondary | ICD-10-CM | POA: Diagnosis not present

## 2022-03-03 DIAGNOSIS — M5416 Radiculopathy, lumbar region: Secondary | ICD-10-CM | POA: Diagnosis not present

## 2022-03-08 DIAGNOSIS — M5416 Radiculopathy, lumbar region: Secondary | ICD-10-CM | POA: Diagnosis not present

## 2022-03-08 DIAGNOSIS — M6389 Disorders of muscle in diseases classified elsewhere, multiple sites: Secondary | ICD-10-CM | POA: Diagnosis not present

## 2022-03-08 DIAGNOSIS — M5459 Other low back pain: Secondary | ICD-10-CM | POA: Diagnosis not present

## 2022-03-08 DIAGNOSIS — R278 Other lack of coordination: Secondary | ICD-10-CM | POA: Diagnosis not present

## 2022-03-08 DIAGNOSIS — M5134 Other intervertebral disc degeneration, thoracic region: Secondary | ICD-10-CM | POA: Diagnosis not present

## 2022-03-08 DIAGNOSIS — M5137 Other intervertebral disc degeneration, lumbosacral region: Secondary | ICD-10-CM | POA: Diagnosis not present

## 2022-03-12 DIAGNOSIS — M5134 Other intervertebral disc degeneration, thoracic region: Secondary | ICD-10-CM | POA: Diagnosis not present

## 2022-03-12 DIAGNOSIS — M5459 Other low back pain: Secondary | ICD-10-CM | POA: Diagnosis not present

## 2022-03-12 DIAGNOSIS — M5416 Radiculopathy, lumbar region: Secondary | ICD-10-CM | POA: Diagnosis not present

## 2022-03-12 DIAGNOSIS — M6389 Disorders of muscle in diseases classified elsewhere, multiple sites: Secondary | ICD-10-CM | POA: Diagnosis not present

## 2022-03-12 DIAGNOSIS — M5137 Other intervertebral disc degeneration, lumbosacral region: Secondary | ICD-10-CM | POA: Diagnosis not present

## 2022-03-12 DIAGNOSIS — R278 Other lack of coordination: Secondary | ICD-10-CM | POA: Diagnosis not present

## 2022-03-17 DIAGNOSIS — M5416 Radiculopathy, lumbar region: Secondary | ICD-10-CM | POA: Diagnosis not present

## 2022-03-19 DIAGNOSIS — M5137 Other intervertebral disc degeneration, lumbosacral region: Secondary | ICD-10-CM | POA: Diagnosis not present

## 2022-03-19 DIAGNOSIS — M5416 Radiculopathy, lumbar region: Secondary | ICD-10-CM | POA: Diagnosis not present

## 2022-03-19 DIAGNOSIS — M5134 Other intervertebral disc degeneration, thoracic region: Secondary | ICD-10-CM | POA: Diagnosis not present

## 2022-03-19 DIAGNOSIS — M5459 Other low back pain: Secondary | ICD-10-CM | POA: Diagnosis not present

## 2022-03-19 DIAGNOSIS — R278 Other lack of coordination: Secondary | ICD-10-CM | POA: Diagnosis not present

## 2022-03-19 DIAGNOSIS — M6389 Disorders of muscle in diseases classified elsewhere, multiple sites: Secondary | ICD-10-CM | POA: Diagnosis not present

## 2022-03-24 DIAGNOSIS — M5416 Radiculopathy, lumbar region: Secondary | ICD-10-CM | POA: Diagnosis not present

## 2022-03-31 DIAGNOSIS — M5416 Radiculopathy, lumbar region: Secondary | ICD-10-CM | POA: Diagnosis not present

## 2022-04-07 DIAGNOSIS — M5416 Radiculopathy, lumbar region: Secondary | ICD-10-CM | POA: Diagnosis not present

## 2022-04-14 DIAGNOSIS — M4805 Spinal stenosis, thoracolumbar region: Secondary | ICD-10-CM | POA: Diagnosis not present

## 2022-04-14 DIAGNOSIS — E039 Hypothyroidism, unspecified: Secondary | ICD-10-CM | POA: Diagnosis not present

## 2022-04-14 DIAGNOSIS — I10 Essential (primary) hypertension: Secondary | ICD-10-CM | POA: Diagnosis not present

## 2022-04-14 DIAGNOSIS — M5136 Other intervertebral disc degeneration, lumbar region: Secondary | ICD-10-CM | POA: Diagnosis not present

## 2022-04-14 DIAGNOSIS — Z01818 Encounter for other preprocedural examination: Secondary | ICD-10-CM | POA: Diagnosis not present

## 2022-04-15 DIAGNOSIS — L570 Actinic keratosis: Secondary | ICD-10-CM | POA: Diagnosis not present

## 2022-04-15 DIAGNOSIS — D1801 Hemangioma of skin and subcutaneous tissue: Secondary | ICD-10-CM | POA: Diagnosis not present

## 2022-04-15 DIAGNOSIS — D692 Other nonthrombocytopenic purpura: Secondary | ICD-10-CM | POA: Diagnosis not present

## 2022-04-15 DIAGNOSIS — L814 Other melanin hyperpigmentation: Secondary | ICD-10-CM | POA: Diagnosis not present

## 2022-04-15 DIAGNOSIS — L821 Other seborrheic keratosis: Secondary | ICD-10-CM | POA: Diagnosis not present

## 2022-04-19 LAB — LIPID PANEL
Cholesterol: 209 — AB (ref 0–200)
HDL: 85 — AB (ref 35–70)
LDL Cholesterol: 109
Triglycerides: 73 (ref 40–160)

## 2022-04-19 LAB — TSH: TSH: 3 (ref 0.41–5.90)

## 2022-04-20 DIAGNOSIS — H43813 Vitreous degeneration, bilateral: Secondary | ICD-10-CM | POA: Diagnosis not present

## 2022-04-20 DIAGNOSIS — H26493 Other secondary cataract, bilateral: Secondary | ICD-10-CM | POA: Diagnosis not present

## 2022-04-20 DIAGNOSIS — H02052 Trichiasis without entropian right lower eyelid: Secondary | ICD-10-CM | POA: Diagnosis not present

## 2022-04-20 DIAGNOSIS — H02055 Trichiasis without entropian left lower eyelid: Secondary | ICD-10-CM | POA: Diagnosis not present

## 2022-04-21 DIAGNOSIS — M5416 Radiculopathy, lumbar region: Secondary | ICD-10-CM | POA: Diagnosis not present

## 2022-04-23 DIAGNOSIS — Z01818 Encounter for other preprocedural examination: Secondary | ICD-10-CM | POA: Diagnosis not present

## 2022-04-28 DIAGNOSIS — I82421 Acute embolism and thrombosis of right iliac vein: Secondary | ICD-10-CM | POA: Diagnosis not present

## 2022-04-28 DIAGNOSIS — M4804 Spinal stenosis, thoracic region: Secondary | ICD-10-CM | POA: Diagnosis not present

## 2022-04-28 DIAGNOSIS — G8929 Other chronic pain: Secondary | ICD-10-CM | POA: Diagnosis not present

## 2022-04-28 DIAGNOSIS — Z87891 Personal history of nicotine dependence: Secondary | ICD-10-CM | POA: Diagnosis not present

## 2022-04-28 DIAGNOSIS — M4157 Other secondary scoliosis, lumbosacral region: Secondary | ICD-10-CM | POA: Diagnosis not present

## 2022-04-28 DIAGNOSIS — N281 Cyst of kidney, acquired: Secondary | ICD-10-CM | POA: Diagnosis not present

## 2022-04-28 DIAGNOSIS — I10 Essential (primary) hypertension: Secondary | ICD-10-CM | POA: Diagnosis not present

## 2022-04-28 DIAGNOSIS — Z993 Dependence on wheelchair: Secondary | ICD-10-CM | POA: Diagnosis not present

## 2022-04-28 DIAGNOSIS — Z452 Encounter for adjustment and management of vascular access device: Secondary | ICD-10-CM | POA: Diagnosis not present

## 2022-04-28 DIAGNOSIS — M47895 Other spondylosis, thoracolumbar region: Secondary | ICD-10-CM | POA: Diagnosis not present

## 2022-04-28 DIAGNOSIS — M21371 Foot drop, right foot: Secondary | ICD-10-CM | POA: Diagnosis not present

## 2022-04-28 DIAGNOSIS — G629 Polyneuropathy, unspecified: Secondary | ICD-10-CM | POA: Diagnosis not present

## 2022-04-28 DIAGNOSIS — M4805 Spinal stenosis, thoracolumbar region: Secondary | ICD-10-CM | POA: Diagnosis not present

## 2022-04-28 DIAGNOSIS — J9691 Respiratory failure, unspecified with hypoxia: Secondary | ICD-10-CM | POA: Diagnosis not present

## 2022-04-28 DIAGNOSIS — M48061 Spinal stenosis, lumbar region without neurogenic claudication: Secondary | ICD-10-CM | POA: Diagnosis not present

## 2022-04-28 DIAGNOSIS — Z20822 Contact with and (suspected) exposure to covid-19: Secondary | ICD-10-CM | POA: Diagnosis not present

## 2022-04-28 DIAGNOSIS — M8938 Hypertrophy of bone, other site: Secondary | ICD-10-CM | POA: Diagnosis not present

## 2022-04-28 DIAGNOSIS — M4156 Other secondary scoliosis, lumbar region: Secondary | ICD-10-CM | POA: Diagnosis not present

## 2022-04-28 DIAGNOSIS — Z7409 Other reduced mobility: Secondary | ICD-10-CM | POA: Diagnosis not present

## 2022-04-28 DIAGNOSIS — R34 Anuria and oliguria: Secondary | ICD-10-CM | POA: Diagnosis not present

## 2022-04-28 DIAGNOSIS — M5136 Other intervertebral disc degeneration, lumbar region: Secondary | ICD-10-CM | POA: Diagnosis not present

## 2022-04-28 DIAGNOSIS — M5105 Intervertebral disc disorders with myelopathy, thoracolumbar region: Secondary | ICD-10-CM | POA: Diagnosis not present

## 2022-04-28 DIAGNOSIS — M6281 Muscle weakness (generalized): Secondary | ICD-10-CM | POA: Diagnosis not present

## 2022-04-28 DIAGNOSIS — T829XXA Unspecified complication of cardiac and vascular prosthetic device, implant and graft, initial encounter: Secondary | ICD-10-CM | POA: Diagnosis not present

## 2022-04-28 DIAGNOSIS — M5135 Other intervertebral disc degeneration, thoracolumbar region: Secondary | ICD-10-CM | POA: Diagnosis not present

## 2022-04-28 DIAGNOSIS — E039 Hypothyroidism, unspecified: Secondary | ICD-10-CM | POA: Diagnosis not present

## 2022-04-28 DIAGNOSIS — I2693 Single subsegmental pulmonary embolism without acute cor pulmonale: Secondary | ICD-10-CM | POA: Diagnosis not present

## 2022-04-28 DIAGNOSIS — I2699 Other pulmonary embolism without acute cor pulmonale: Secondary | ICD-10-CM | POA: Diagnosis not present

## 2022-04-28 DIAGNOSIS — Z981 Arthrodesis status: Secondary | ICD-10-CM | POA: Diagnosis not present

## 2022-04-28 DIAGNOSIS — I82411 Acute embolism and thrombosis of right femoral vein: Secondary | ICD-10-CM | POA: Diagnosis not present

## 2022-04-28 DIAGNOSIS — R918 Other nonspecific abnormal finding of lung field: Secondary | ICD-10-CM | POA: Diagnosis not present

## 2022-04-28 DIAGNOSIS — I2694 Multiple subsegmental pulmonary emboli without acute cor pulmonale: Secondary | ICD-10-CM | POA: Diagnosis not present

## 2022-04-28 DIAGNOSIS — M4150 Other secondary scoliosis, site unspecified: Secondary | ICD-10-CM | POA: Diagnosis not present

## 2022-04-29 DIAGNOSIS — M5136 Other intervertebral disc degeneration, lumbar region: Secondary | ICD-10-CM | POA: Diagnosis not present

## 2022-04-29 DIAGNOSIS — J9691 Respiratory failure, unspecified with hypoxia: Secondary | ICD-10-CM | POA: Diagnosis not present

## 2022-04-29 DIAGNOSIS — M5135 Other intervertebral disc degeneration, thoracolumbar region: Secondary | ICD-10-CM | POA: Diagnosis not present

## 2022-04-29 DIAGNOSIS — I82421 Acute embolism and thrombosis of right iliac vein: Secondary | ICD-10-CM | POA: Diagnosis not present

## 2022-04-29 DIAGNOSIS — I10 Essential (primary) hypertension: Secondary | ICD-10-CM | POA: Diagnosis not present

## 2022-04-29 DIAGNOSIS — M4805 Spinal stenosis, thoracolumbar region: Secondary | ICD-10-CM | POA: Diagnosis not present

## 2022-04-29 DIAGNOSIS — M48061 Spinal stenosis, lumbar region without neurogenic claudication: Secondary | ICD-10-CM | POA: Diagnosis not present

## 2022-04-29 DIAGNOSIS — M47895 Other spondylosis, thoracolumbar region: Secondary | ICD-10-CM | POA: Diagnosis not present

## 2022-04-29 DIAGNOSIS — T829XXA Unspecified complication of cardiac and vascular prosthetic device, implant and graft, initial encounter: Secondary | ICD-10-CM | POA: Diagnosis not present

## 2022-04-29 DIAGNOSIS — M8938 Hypertrophy of bone, other site: Secondary | ICD-10-CM | POA: Diagnosis not present

## 2022-04-29 DIAGNOSIS — I82411 Acute embolism and thrombosis of right femoral vein: Secondary | ICD-10-CM | POA: Diagnosis not present

## 2022-04-29 DIAGNOSIS — M4150 Other secondary scoliosis, site unspecified: Secondary | ICD-10-CM | POA: Diagnosis not present

## 2022-04-29 DIAGNOSIS — I2699 Other pulmonary embolism without acute cor pulmonale: Secondary | ICD-10-CM | POA: Diagnosis not present

## 2022-04-29 DIAGNOSIS — I2693 Single subsegmental pulmonary embolism without acute cor pulmonale: Secondary | ICD-10-CM | POA: Diagnosis not present

## 2022-04-29 DIAGNOSIS — E039 Hypothyroidism, unspecified: Secondary | ICD-10-CM | POA: Diagnosis not present

## 2022-04-30 DIAGNOSIS — M4805 Spinal stenosis, thoracolumbar region: Secondary | ICD-10-CM | POA: Diagnosis not present

## 2022-04-30 DIAGNOSIS — M47895 Other spondylosis, thoracolumbar region: Secondary | ICD-10-CM | POA: Diagnosis not present

## 2022-04-30 DIAGNOSIS — I82421 Acute embolism and thrombosis of right iliac vein: Secondary | ICD-10-CM | POA: Diagnosis not present

## 2022-04-30 DIAGNOSIS — M5135 Other intervertebral disc degeneration, thoracolumbar region: Secondary | ICD-10-CM | POA: Diagnosis not present

## 2022-04-30 DIAGNOSIS — R34 Anuria and oliguria: Secondary | ICD-10-CM | POA: Diagnosis not present

## 2022-04-30 DIAGNOSIS — J9691 Respiratory failure, unspecified with hypoxia: Secondary | ICD-10-CM | POA: Diagnosis not present

## 2022-04-30 DIAGNOSIS — M5136 Other intervertebral disc degeneration, lumbar region: Secondary | ICD-10-CM | POA: Diagnosis not present

## 2022-04-30 DIAGNOSIS — M48061 Spinal stenosis, lumbar region without neurogenic claudication: Secondary | ICD-10-CM | POA: Diagnosis not present

## 2022-04-30 DIAGNOSIS — I82411 Acute embolism and thrombosis of right femoral vein: Secondary | ICD-10-CM | POA: Diagnosis not present

## 2022-04-30 DIAGNOSIS — I2693 Single subsegmental pulmonary embolism without acute cor pulmonale: Secondary | ICD-10-CM | POA: Diagnosis not present

## 2022-04-30 DIAGNOSIS — M8938 Hypertrophy of bone, other site: Secondary | ICD-10-CM | POA: Diagnosis not present

## 2022-04-30 DIAGNOSIS — M4150 Other secondary scoliosis, site unspecified: Secondary | ICD-10-CM | POA: Diagnosis not present

## 2022-04-30 DIAGNOSIS — I2699 Other pulmonary embolism without acute cor pulmonale: Secondary | ICD-10-CM | POA: Diagnosis not present

## 2022-04-30 DIAGNOSIS — I10 Essential (primary) hypertension: Secondary | ICD-10-CM | POA: Diagnosis not present

## 2022-04-30 DIAGNOSIS — E039 Hypothyroidism, unspecified: Secondary | ICD-10-CM | POA: Diagnosis not present

## 2022-05-02 DIAGNOSIS — R918 Other nonspecific abnormal finding of lung field: Secondary | ICD-10-CM | POA: Diagnosis not present

## 2022-05-02 DIAGNOSIS — M48061 Spinal stenosis, lumbar region without neurogenic claudication: Secondary | ICD-10-CM | POA: Diagnosis not present

## 2022-05-02 DIAGNOSIS — I2699 Other pulmonary embolism without acute cor pulmonale: Secondary | ICD-10-CM | POA: Diagnosis not present

## 2022-05-03 DIAGNOSIS — M48061 Spinal stenosis, lumbar region without neurogenic claudication: Secondary | ICD-10-CM | POA: Diagnosis not present

## 2022-05-04 DIAGNOSIS — M4150 Other secondary scoliosis, site unspecified: Secondary | ICD-10-CM | POA: Diagnosis not present

## 2022-05-04 DIAGNOSIS — Z981 Arthrodesis status: Secondary | ICD-10-CM | POA: Diagnosis not present

## 2022-05-07 DIAGNOSIS — Z4789 Encounter for other orthopedic aftercare: Secondary | ICD-10-CM | POA: Diagnosis not present

## 2022-05-07 DIAGNOSIS — R2689 Other abnormalities of gait and mobility: Secondary | ICD-10-CM | POA: Diagnosis not present

## 2022-05-07 DIAGNOSIS — M4804 Spinal stenosis, thoracic region: Secondary | ICD-10-CM | POA: Diagnosis not present

## 2022-05-07 DIAGNOSIS — R278 Other lack of coordination: Secondary | ICD-10-CM | POA: Diagnosis not present

## 2022-05-07 DIAGNOSIS — M48061 Spinal stenosis, lumbar region without neurogenic claudication: Secondary | ICD-10-CM | POA: Diagnosis not present

## 2022-05-07 DIAGNOSIS — M5459 Other low back pain: Secondary | ICD-10-CM | POA: Diagnosis not present

## 2022-05-10 ENCOUNTER — Non-Acute Institutional Stay (SKILLED_NURSING_FACILITY): Payer: Medicare Other | Admitting: Internal Medicine

## 2022-05-10 ENCOUNTER — Encounter: Payer: Self-pay | Admitting: Internal Medicine

## 2022-05-10 DIAGNOSIS — Z4789 Encounter for other orthopedic aftercare: Secondary | ICD-10-CM | POA: Diagnosis not present

## 2022-05-10 DIAGNOSIS — I82411 Acute embolism and thrombosis of right femoral vein: Secondary | ICD-10-CM | POA: Diagnosis not present

## 2022-05-10 DIAGNOSIS — I2694 Multiple subsegmental pulmonary emboli without acute cor pulmonale: Secondary | ICD-10-CM

## 2022-05-10 DIAGNOSIS — Z9889 Other specified postprocedural states: Secondary | ICD-10-CM

## 2022-05-10 DIAGNOSIS — M5459 Other low back pain: Secondary | ICD-10-CM | POA: Diagnosis not present

## 2022-05-10 DIAGNOSIS — R278 Other lack of coordination: Secondary | ICD-10-CM | POA: Diagnosis not present

## 2022-05-10 DIAGNOSIS — M4804 Spinal stenosis, thoracic region: Secondary | ICD-10-CM | POA: Diagnosis not present

## 2022-05-10 DIAGNOSIS — I10 Essential (primary) hypertension: Secondary | ICD-10-CM

## 2022-05-10 DIAGNOSIS — E039 Hypothyroidism, unspecified: Secondary | ICD-10-CM

## 2022-05-10 DIAGNOSIS — M48061 Spinal stenosis, lumbar region without neurogenic claudication: Secondary | ICD-10-CM | POA: Diagnosis not present

## 2022-05-10 DIAGNOSIS — R2689 Other abnormalities of gait and mobility: Secondary | ICD-10-CM | POA: Diagnosis not present

## 2022-05-10 MED ORDER — OXYCODONE HCL 5 MG PO TABS: 5.0000 mg | ORAL_TABLET | Freq: Every evening | ORAL | 0 refills | Status: DC | PRN

## 2022-05-10 NOTE — Progress Notes (Signed)
?Provider:  Veleta Miners MD ?Location:    [facility] ?Nursing Home Room Number: 151 ?Place of Service:  SNF (31) ? ?PCP: Donnajean Lopes, MD ?Patient Care Team: ?Donnajean Lopes, MD as PCP - General (Internal Medicine) ? ?Extended Emergency Contact Information ?Primary Emergency Contact: Basu,thomas ?Home Phone: [phone] ?Relation: Son ?Secondary Emergency Contact: Abram,william ?Home Phone: [phone] ?Relation: Son ? ?Code Status: Full Code ?Goals of Care: Advanced Directive information ? ?  05/10/2022  ? 11:45 AM  ?Advanced Directives  ?Does Patient Have a Medical Advance Directive? Yes  ?Type of Advance Directive Living will;Healthcare Power of Attorney  ?Does patient want to make changes to medical advance directive? No - Patient declined  ?Copy of [place] in Chart? Yes - validated most recent copy scanned in chart (See row information)  ? ? ? ? ?Chief Complaint  ?Patient presents with  ? New Admit To SNF  ?  Admission to SNF  ? ? ?HPI: Patient is a 81 y.o. female seen today for admission to Rehab for Therapy  ? ?Patient was admitted in the hospital from 5/3 to 05 11 ?She underwent removal of prior instrumentation and insertion of interbody biomechanical device in her spine from T11-L4 ?Her stay was complicated when She had multiple PE and right common femoral vein DVT.  She was loaded with Eliquis and is now discharged on 5 mg twice daily for 3 months ?Her postop pain was managed by PCA and then was transitioned to oral pain medication ?She is now discharged to the skilled rehab in [facility] ? ?Patient also has a history of Hypertension and hypothyroidism ?Before going out for surgery patient was using power wheelchair ? ?Patient did not have any acute complaints today.  She does not want to take oxycodone only at night.  Continues to have pain in both her legs ?Was able to walk with therapy today ? ?Past Medical History:  ?Diagnosis Date  ?  Arthritis   ? OA  ? GERD (gastroesophageal reflux disease)   ? Hematuria   ? idiopathic- [facility]. [facility]  ? Hyperlipemia   ? Hypertension   ? Hypothyroidism   ? ?Past Surgical History:  ?Procedure Laterality Date  ? ABDOMINAL HYSTERECTOMY    ? APPENDECTOMY    ? BACK SURGERY    ? lumbar  ? EYE SURGERY    ? bilateral cataract extraction with IOL  ? GASTROC RECESSION EXTREMITY Left 02/23/2018  ? Procedure: Left Gastroc Recession;  Surgeon: Wylene Simmer, MD;  Location: [facility];  Service: Orthopedics;  Laterality: Left;  ? HAND SURGERY    ? left thumb MP  ? JOINT REPLACEMENT    ? right knee  ? PARTIAL KNEE ARTHROPLASTY  11/14/2012  ? Procedure: UNICOMPARTMENTAL KNEE;  Surgeon: Mauri Pole, MD;  Location: [facility];  Service: Orthopedics;  Laterality: Left;  ? TARSAL METATARSAL ARTHRODESIS Left 02/23/2018  ? Procedure: Left First and Second Tarsometatarsal Arthrodesis;  Surgeon: Wylene Simmer, MD;  Location: [facility];  Service: Orthopedics;  Laterality: Left;  ? thumb surgery    ? left  ? VAGINAL HYSTERECTOMY    ? ? reports that she quit smoking about 33 years ago. Her smoking use included cigarettes. She has never used smokeless tobacco. She reports current alcohol use of about 7.0 standard drinks per week. She reports that she does not use drugs. ?Social History  ? ?Socioeconomic History  ? Marital status: Widowed  ?  Spouse name: Not  on file  ? Number of children: Not on file  ? Years of education: Not on file  ? Highest education level: Not on file  ?Occupational History  ? Not on file  ?Tobacco Use  ? Smoking status: Former  ?  Types: Cigarettes  ?  Quit date: 12/27/1988  ?  Years since quitting: 33.3  ? Smokeless tobacco: Never  ?Vaping Use  ? Vaping Use: Never used  ?Substance and Sexual Activity  ? Alcohol use: Yes  ?  Alcohol/week: 7.0 standard drinks  ?  Types: 7 Glasses of wine per week  ?  Comment: wine nightly   1 glass  ? Drug use: No  ? Sexual activity: Not on file  ?Other  Topics Concern  ? Not on file  ?Social History Narrative  ? Not on file  ? ?Social Determinants of Health  ? ?Financial Resource Strain: Not on file  ?Food Insecurity: Not on file  ?Transportation Needs: Not on file  ?Physical Activity: Not on file  ?Stress: Not on file  ?Social Connections: Not on file  ?Intimate Partner Violence: Not on file  ? ? ?Functional Status Survey: ?  ? ?Family History  ?Problem Relation Age of Onset  ? Breast cancer Sister 61  ? Breast cancer Maternal Aunt   ? Colon cancer Neg Hx   ? Stomach cancer Neg Hx   ? Liver cancer Neg Hx   ? Pancreatic cancer Neg Hx   ? Rectal cancer Neg Hx   ? ? ?Health Maintenance  ?Topic Date Due  ? COVID-19 Vaccine (1) Never done  ? Pneumonia Vaccine 61+ Years old (1 - PCV) Never done  ? Zoster Vaccines- Shingrix (2 of 2) 03/03/2018  ? TETANUS/TDAP  12/27/2018  ? INFLUENZA VACCINE  07/27/2022  ? DEXA SCAN  Completed  ? HPV VACCINES  Aged Out  ? ? ?Allergies  ?Allergen Reactions  ? Statins   ? Zetia [Ezetimibe]   ? ? ?Allergies as of 05/10/2022   ? ?   Reactions  ? Statins   ? Zetia [ezetimibe]   ? ?  ? ?  ?Medication List  ?  ? ?  ? Accurate as of May 10, 2022 11:48 AM. If you have any questions, ask your nurse or doctor.  ?  ?  ? ?  ? ?STOP taking these medications   ? ?aspirin 81 MG tablet ?Stopped by: Virgie Dad, MD ?  ? ?  ? ?TAKE these medications   ? ?acetaminophen 325 MG tablet ?Commonly known as: TYLENOL ?Take 650 mg by mouth every 4 (four) hours as needed. ?  ?amLODipine 2.5 MG tablet ?Commonly known as: NORVASC ?Take 2.5 mg by mouth daily before breakfast. ?  ?Biotin 5000 MCG Caps ?Take 1 capsule by mouth daily. ?  ?CALCIUM & MAGNESIUM CARBONATES PO ?Take 1 tablet by mouth at bedtime. ?  ?cholecalciferol 25 MCG (1000 UNIT) tablet ?Commonly known as: VITAMIN D3 ?Take 1,000 Units by mouth daily. ?What changed: Another medication with the same name was removed. Continue taking this medication, and follow the directions you see here. ?Changed by:  Virgie Dad, MD ?  ?cycloSPORINE 0.05 % ophthalmic emulsion ?Commonly known as: RESTASIS ?Place 1 drop into both eyes 2 (two) times daily. ?  ?Eliquis 5 MG Tabs tablet ?Generic drug: apixaban ?Take 5 mg by mouth 2 (two) times daily. ?  ?esomeprazole 20 MG capsule ?Commonly known as: [place] ?Take 20 mg by mouth daily at 12 noon. ?  ?  Evening Primrose Oil Caps ?Take 1 capsule by mouth 2 (two) times daily. ?  ?fish oil-omega-3 fatty acids 1000 MG capsule ?Take 1 g by mouth 3 (three) times daily. ?  ?glucosamine-chondroitin 500-400 MG tablet ?Take 1 tablet by mouth daily. ?  ?levothyroxine 25 MCG tablet ?Commonly known as: SYNTHROID ?Take 50 mcg by mouth daily before breakfast. ?  ?lidocaine 4 % cream ?Commonly known as: LMX ?Apply 1 application. topically 2 (two) times daily as needed. ?  ?Magnesium Citrate 100 MG Tabs ?Take 150 mg by mouth every morning. ?  ?meloxicam 7.5 MG tablet ?Commonly known as: MOBIC ?Take 7.5 mg by mouth daily as needed. ?  ?methocarbamol 750 MG tablet ?Commonly known as: ROBAXIN ?Take 750 mg by mouth 3 (three) times daily. ?  ?olmesartan-hydrochlorothiazide 40-12.5 MG tablet ?Commonly known as: BENICAR HCT ?Take 1 tablet by mouth daily before breakfast. ?  ?omega-3 acid ethyl esters 1 g capsule ?Commonly known as: LOVAZA ?Take 2 g by mouth 3 (three) times daily. ?  ?ondansetron 4 MG disintegrating tablet ?Commonly known as: ZOFRAN-ODT ?Take 4 mg by mouth every 6 (six) hours as needed for nausea or vomiting. ?  ?oxyCODONE 5 MG immediate release tablet ?Commonly known as: Oxy IR/ROXICODONE ?Take 5 mg by mouth as needed for severe pain. ?  ?polyethylene glycol 17 g packet ?Commonly known as: MIRALAX / GLYCOLAX ?Take 17 g by mouth daily. ?  ?potassium chloride SA 20 MEQ tablet ?Commonly known as: KLOR-CON M ?Take 20 mEq by mouth daily before breakfast. ?  ?sennosides-docusate sodium 8.6-50 MG tablet ?Commonly known as: SENOKOT-S ?Take 1 tablet by mouth 2 (two) times daily. ?  ?ULTRA VITA TIME  IRON FREE PO ?Take 1 tablet by mouth daily. ?  ?Vitamin D (Ergocalciferol) 1.25 MG (50000 UNIT) Caps capsule ?Commonly known as: DRISDOL ?Take 50,000 Units by mouth every 30 (thirty) days. On the first of the mo

## 2022-05-11 ENCOUNTER — Other Ambulatory Visit: Payer: Self-pay | Admitting: Orthopedic Surgery

## 2022-05-11 DIAGNOSIS — M48061 Spinal stenosis, lumbar region without neurogenic claudication: Secondary | ICD-10-CM | POA: Diagnosis not present

## 2022-05-11 DIAGNOSIS — R2689 Other abnormalities of gait and mobility: Secondary | ICD-10-CM | POA: Diagnosis not present

## 2022-05-11 DIAGNOSIS — Z9889 Other specified postprocedural states: Secondary | ICD-10-CM

## 2022-05-11 DIAGNOSIS — Z4789 Encounter for other orthopedic aftercare: Secondary | ICD-10-CM | POA: Diagnosis not present

## 2022-05-11 DIAGNOSIS — M5459 Other low back pain: Secondary | ICD-10-CM | POA: Diagnosis not present

## 2022-05-11 DIAGNOSIS — M6389 Disorders of muscle in diseases classified elsewhere, multiple sites: Secondary | ICD-10-CM | POA: Diagnosis not present

## 2022-05-11 DIAGNOSIS — R278 Other lack of coordination: Secondary | ICD-10-CM | POA: Diagnosis not present

## 2022-05-11 DIAGNOSIS — M4804 Spinal stenosis, thoracic region: Secondary | ICD-10-CM | POA: Diagnosis not present

## 2022-05-11 MED ORDER — OXYCODONE HCL 5 MG PO TABS: 5.0000 mg | ORAL_TABLET | Freq: Two times a day (BID) | ORAL | 0 refills | Status: AC | PRN

## 2022-05-12 DIAGNOSIS — M48061 Spinal stenosis, lumbar region without neurogenic claudication: Secondary | ICD-10-CM | POA: Diagnosis not present

## 2022-05-12 DIAGNOSIS — M4804 Spinal stenosis, thoracic region: Secondary | ICD-10-CM | POA: Diagnosis not present

## 2022-05-12 DIAGNOSIS — M5459 Other low back pain: Secondary | ICD-10-CM | POA: Diagnosis not present

## 2022-05-12 DIAGNOSIS — Z4789 Encounter for other orthopedic aftercare: Secondary | ICD-10-CM | POA: Diagnosis not present

## 2022-05-12 DIAGNOSIS — R278 Other lack of coordination: Secondary | ICD-10-CM | POA: Diagnosis not present

## 2022-05-12 DIAGNOSIS — R2689 Other abnormalities of gait and mobility: Secondary | ICD-10-CM | POA: Diagnosis not present

## 2022-05-13 ENCOUNTER — Encounter: Payer: Self-pay | Admitting: Adult Health

## 2022-05-13 ENCOUNTER — Non-Acute Institutional Stay (SKILLED_NURSING_FACILITY): Payer: Medicare Other | Admitting: Adult Health

## 2022-05-13 DIAGNOSIS — M5459 Other low back pain: Secondary | ICD-10-CM | POA: Diagnosis not present

## 2022-05-13 DIAGNOSIS — M6389 Disorders of muscle in diseases classified elsewhere, multiple sites: Secondary | ICD-10-CM | POA: Diagnosis not present

## 2022-05-13 DIAGNOSIS — M4805 Spinal stenosis, thoracolumbar region: Secondary | ICD-10-CM | POA: Diagnosis not present

## 2022-05-13 DIAGNOSIS — Z4789 Encounter for other orthopedic aftercare: Secondary | ICD-10-CM | POA: Diagnosis not present

## 2022-05-13 DIAGNOSIS — M48061 Spinal stenosis, lumbar region without neurogenic claudication: Secondary | ICD-10-CM | POA: Diagnosis not present

## 2022-05-13 DIAGNOSIS — I2694 Multiple subsegmental pulmonary emboli without acute cor pulmonale: Secondary | ICD-10-CM

## 2022-05-13 DIAGNOSIS — R2689 Other abnormalities of gait and mobility: Secondary | ICD-10-CM | POA: Diagnosis not present

## 2022-05-13 DIAGNOSIS — R278 Other lack of coordination: Secondary | ICD-10-CM | POA: Diagnosis not present

## 2022-05-13 DIAGNOSIS — M7989 Other specified soft tissue disorders: Secondary | ICD-10-CM

## 2022-05-13 DIAGNOSIS — I82411 Acute embolism and thrombosis of right femoral vein: Secondary | ICD-10-CM

## 2022-05-13 DIAGNOSIS — M4804 Spinal stenosis, thoracic region: Secondary | ICD-10-CM | POA: Diagnosis not present

## 2022-05-13 NOTE — Progress Notes (Signed)
Location:  [facility] Room Number: 151-A Place of Service:  SNF [phone]) Provider:  Royal Hawthorn, NP   Patient Care Team: Donnajean Lopes, MD as PCP - General (Internal Medicine)  Extended Emergency Contact Information Primary Emergency Contact: Bisesi,thomas Home Phone: [phone] Relation: Son Secondary Emergency Contact: Unterreiner,william Home Phone: [phone] Relation: Son  Code Status:  Full Code  Goals of care: Advanced Directive information    05/13/2022   10:42 AM  Advanced Directives  Does Patient Have a Medical Advance Directive? Yes  Type of Advance Directive Living will;Healthcare Power of Attorney  Does patient want to make changes to medical advance directive? No - Patient declined  Copy of [place] in Chart? Yes - validated most recent copy scanned in chart (See row information)     Chief Complaint  Patient presents with   Acute Visit    Follow up on leg swelling     HPI:  Pt is a 81 y.o. female seen today for an acute visit for leg swelling.  Patient was admitted in the hospital from 5/3 to 05/11 and underwent removal of prior instrumentation and insertion of interbody biomechanical device in her spine from T11-L4. During her stay she was found to have multiple PE and DVT in the right common femoral vein. She was given loading dose of eliquis and is now on 5 mg bid.  Nurse reports some swelling and weeping in the right lower ext. She is not wearing compression hose. No numbness or tingling. She is able to ambulate and is working with therapy. No fever, sob, redness, etc.   Past Medical History:  Diagnosis Date   Arthritis    OA   GERD (gastroesophageal reflux disease)    Hematuria    idiopathic- Dr. [facility]   Hyperlipemia    Hypertension    Hypothyroidism    Past Surgical History:  Procedure Laterality Date   ABDOMINAL HYSTERECTOMY     APPENDECTOMY     BACK SURGERY     lumbar    EYE SURGERY     bilateral cataract extraction with IOL   GASTROC RECESSION EXTREMITY Left 02/23/2018   Procedure: Left Gastroc Recession;  Surgeon: Wylene Simmer, MD;  Location: [facility];  Service: Orthopedics;  Laterality: Left;   HAND SURGERY     left thumb MP   JOINT REPLACEMENT     right knee   PARTIAL KNEE ARTHROPLASTY  11/14/2012   Procedure: UNICOMPARTMENTAL KNEE;  Surgeon: Mauri Pole, MD;  Location: [facility];  Service: Orthopedics;  Laterality: Left;   TARSAL METATARSAL ARTHRODESIS Left 02/23/2018   Procedure: Left First and Second Tarsometatarsal Arthrodesis;  Surgeon: Wylene Simmer, MD;  Location: [facility];  Service: Orthopedics;  Laterality: Left;   thumb surgery     left   VAGINAL HYSTERECTOMY      Allergies  Allergen Reactions   Statins    Zetia [Ezetimibe]     Outpatient Encounter Medications as of 05/13/2022  Medication Sig   amLODipine (NORVASC) 2.5 MG tablet Take 2.5 mg by mouth daily before breakfast.   apixaban (ELIQUIS) 5 MG TABS tablet Take 5 mg by mouth 2 (two) times daily.   Biotin 5000 MCG CAPS Take 1 capsule by mouth daily.   Calcium Citrate-Vitamin D (CALCIUM CITRATE + D) 315-5 MG-MCG TABS Take 2 tablets by mouth in the morning, at noon, and at bedtime.   cholecalciferol (VITAMIN D3) 25 MCG (1000 UNIT) tablet Take  1,000 Units by mouth daily.   Evening Primrose Oil CAPS Take 1 capsule by mouth daily at 12 noon.   levothyroxine (SYNTHROID, LEVOTHROID) 25 MCG tablet Take 50 mcg by mouth daily before breakfast.   lidocaine (LMX) 4 % cream Apply 1 application. topically 2 (two) times daily.   Magnesium Citrate 100 MG TABS Take 150 mg by mouth every morning.   methocarbamol (ROBAXIN) 750 MG tablet Take 750 mg by mouth 3 (three) times daily.   olmesartan-hydrochlorothiazide (BENICAR HCT) 40-12.5 MG per tablet Take 1 tablet by mouth daily before breakfast.   omega-3 acid ethyl esters (LOVAZA) 1 g capsule Take 2 g by mouth 3  (three) times daily.   oxyCODONE (OXY IR/ROXICODONE) 5 MG immediate release tablet Take 5 mg by mouth at bedtime as needed for severe pain.   oxyCODONE (ROXICODONE) 5 MG immediate release tablet Take 1 tablet (5 mg total) by mouth 2 (two) times daily as needed for up to 14 days for severe pain.   polyethylene glycol (MIRALAX / GLYCOLAX) 17 g packet Take 17 g by mouth daily.   potassium chloride SA (K-DUR,KLOR-CON) 20 MEQ tablet Take 20 mEq by mouth daily before breakfast.   sennosides-docusate sodium (SENOKOT-S) 8.6-50 MG tablet Take 1 tablet by mouth 2 (two) times daily.   Vitamin D, Ergocalciferol, (DRISDOL) 50000 UNITS CAPS Take 50,000 Units by mouth every 30 (thirty) days. On the first of the month   [DISCONTINUED] CALCIUM & MAGNESIUM CARBONATES PO Take 1 tablet by mouth at bedtime.   [DISCONTINUED] cycloSPORINE (RESTASIS) 0.05 % ophthalmic emulsion Place 1 drop into both eyes 2 (two) times daily.   [DISCONTINUED] esomeprazole (NEXIUM) 20 MG capsule Take 20 mg by mouth daily at 12 noon.   [DISCONTINUED] fish oil-omega-3 fatty acids 1000 MG capsule Take 1 g by mouth 3 (three) times daily.   [DISCONTINUED] glucosamine-chondroitin 500-400 MG tablet Take 1 tablet by mouth daily.    [DISCONTINUED] meloxicam (MOBIC) 7.5 MG tablet Take 7.5 mg by mouth daily as needed.    [DISCONTINUED] Multiple Vitamins-Minerals (ULTRA VITA TIME IRON FREE PO) Take 1 tablet by mouth daily.   No facility-administered encounter medications on file as of 05/13/2022.    Review of Systems  Constitutional:  Positive for activity change. Negative for appetite change, chills, diaphoresis, fatigue, fever and unexpected weight change.  HENT:  Negative for congestion.   Respiratory:  Negative for cough, shortness of breath and wheezing.   Cardiovascular:  Positive for leg swelling. Negative for chest pain and palpitations.  Gastrointestinal:  Negative for abdominal distention, abdominal pain, constipation and diarrhea.   Genitourinary:  Negative for difficulty urinating and dysuria.  Musculoskeletal:  Positive for back pain and gait problem. Negative for arthralgias, joint swelling and myalgias.  Neurological:  Negative for dizziness, tremors, seizures, syncope, facial asymmetry, speech difficulty, weakness, light-headedness, numbness and headaches.  Psychiatric/Behavioral:  Negative for agitation, behavioral problems and confusion.    Immunization History  Administered Date(s) Administered   Influenza,inj,quad, With Preservative 08/29/2017   Influenza-Unspecified 09/26/2016   Moderna Covid-19 Vaccine Bivalent Booster 10yr & up 10/09/2021   PFIZER(Purple Top)SARS-COV-2 Vaccination 09/07/2019, 09/27/2019, 04/16/2020   Tdap 12/27/2008   Zoster Recombinat (Shingrix) 07/26/2017, 01/06/2018   Zoster, Live 01/15/2010   Pertinent  Health Maintenance Due  Topic Date Due   INFLUENZA VACCINE  07/27/2022   DEXA SCAN  Completed       View : No data to display.         Functional Status Survey:  Vitals:   05/13/22 1027  BP: 115/68  Pulse: 85  Resp: 18  Temp: 98.7 F (37.1 C)  SpO2: 97%  Weight: 124 lb (56.2 kg)  Height: '5\' 4"'$  (1.626 m)   Body mass index is 21.28 kg/m. Physical Exam Vitals and nursing note reviewed.  Constitutional:      General: She is not in acute distress.    Appearance: She is not diaphoretic.  HENT:     Head: Normocephalic and atraumatic.  Neck:     Vascular: No JVD.  Cardiovascular:     Rate and Rhythm: Normal rate and regular rhythm.     Heart sounds: No murmur heard. Pulmonary:     Effort: Pulmonary effort is normal. No respiratory distress.     Breath sounds: Normal breath sounds. No wheezing.  Musculoskeletal:     Right lower leg: Edema (+2 at the pedal area.) present.     Left lower leg: No edema.     Comments: Tenderness is noted at the pedal area, not at the calf area. No redness, streaking, purulent drainage. +CMS to RLE.   Skin:    General: Skin is  warm and dry.     Comments: Dressing to mid and lower back CDI  Neurological:     Mental Status: She is alert and oriented to person, place, and time.    Labs reviewed: No results for input(s): NA, K, CL, CO2, GLUCOSE, BUN, CREATININE, CALCIUM, MG, PHOS in the last 8760 hours. No results for input(s): AST, ALT, ALKPHOS, BILITOT, PROT, ALBUMIN in the last 8760 hours. No results for input(s): WBC, NEUTROABS, HGB, HCT, MCV, PLT in the last 8760 hours. No results found for: TSH No results found for: HGBA1C No results found for: CHOL, HDL, LDLCALC, LDLDIRECT, TRIG, CHOLHDL  Significant Diagnostic Results in last 30 days:  No results found.  Assessment/Plan  1. Leg swelling Due to DVT She has 2+ edema in the pedal area not the calf, likely due to the gauze wrap on her leg. Has good CMS of the RLE. No sign of infection. Recommend double layer compression wrap gentle due to tenderness. Would likely not tolerate compression hose. Monitor for improvement.   2. Acute deep vein thrombosis (DVT) of femoral vein of right lower extremity (HCC) On eliquis F/U with hematology 3 months   3. Multiple subsegmental pulmonary emboli without acute cor pulmonale (HCC) No sob at this time, on eliquis.   4. Spinal stenosis S/p surgery Pain controlled with oxycodone Working with therapy   Family/ staff Communication: discussed with resident and nurse.   Labs/tests ordered:  CBC BMP 5/22

## 2022-05-14 DIAGNOSIS — M4804 Spinal stenosis, thoracic region: Secondary | ICD-10-CM | POA: Diagnosis not present

## 2022-05-14 DIAGNOSIS — M48061 Spinal stenosis, lumbar region without neurogenic claudication: Secondary | ICD-10-CM | POA: Diagnosis not present

## 2022-05-14 DIAGNOSIS — Z4789 Encounter for other orthopedic aftercare: Secondary | ICD-10-CM | POA: Diagnosis not present

## 2022-05-14 DIAGNOSIS — M5459 Other low back pain: Secondary | ICD-10-CM | POA: Diagnosis not present

## 2022-05-14 DIAGNOSIS — R278 Other lack of coordination: Secondary | ICD-10-CM | POA: Diagnosis not present

## 2022-05-14 DIAGNOSIS — R2689 Other abnormalities of gait and mobility: Secondary | ICD-10-CM | POA: Diagnosis not present

## 2022-05-17 DIAGNOSIS — Z4789 Encounter for other orthopedic aftercare: Secondary | ICD-10-CM | POA: Diagnosis not present

## 2022-05-17 DIAGNOSIS — I10 Essential (primary) hypertension: Secondary | ICD-10-CM | POA: Diagnosis not present

## 2022-05-17 DIAGNOSIS — M5459 Other low back pain: Secondary | ICD-10-CM | POA: Diagnosis not present

## 2022-05-17 DIAGNOSIS — M4804 Spinal stenosis, thoracic region: Secondary | ICD-10-CM | POA: Diagnosis not present

## 2022-05-17 DIAGNOSIS — R2689 Other abnormalities of gait and mobility: Secondary | ICD-10-CM | POA: Diagnosis not present

## 2022-05-17 DIAGNOSIS — R278 Other lack of coordination: Secondary | ICD-10-CM | POA: Diagnosis not present

## 2022-05-17 DIAGNOSIS — M48061 Spinal stenosis, lumbar region without neurogenic claudication: Secondary | ICD-10-CM | POA: Diagnosis not present

## 2022-05-17 LAB — HEPATIC FUNCTION PANEL
ALT: 8 U/L (ref 7–35)
AST: 12 — AB (ref 13–35)
Alkaline Phosphatase: 118 (ref 25–125)
Bilirubin, Total: 0.2

## 2022-05-17 LAB — CBC AND DIFFERENTIAL
HCT: 31 — AB (ref 36–46)
Hemoglobin: 10.7 — AB (ref 12.0–16.0)
Platelets: 423 10*3/uL — AB (ref 150–400)
WBC: 4.5

## 2022-05-17 LAB — COMPREHENSIVE METABOLIC PANEL
Albumin: 3.2 — AB (ref 3.5–5.0)
Calcium: 9.5 (ref 8.7–10.7)
Globulin: 2.3

## 2022-05-17 LAB — BASIC METABOLIC PANEL
BUN: 30 — AB (ref 4–21)
CO2: 23 — AB (ref 13–22)
Chloride: 104 (ref 99–108)
Creatinine: 0.5 (ref 0.5–1.1)
Glucose: 93
Potassium: 3.9 mEq/L (ref 3.5–5.1)
Sodium: 137 (ref 137–147)

## 2022-05-17 LAB — CBC: RBC: 3.34 — AB (ref 3.87–5.11)

## 2022-05-18 DIAGNOSIS — M4804 Spinal stenosis, thoracic region: Secondary | ICD-10-CM | POA: Diagnosis not present

## 2022-05-18 DIAGNOSIS — M48061 Spinal stenosis, lumbar region without neurogenic claudication: Secondary | ICD-10-CM | POA: Diagnosis not present

## 2022-05-18 DIAGNOSIS — R278 Other lack of coordination: Secondary | ICD-10-CM | POA: Diagnosis not present

## 2022-05-18 DIAGNOSIS — M5459 Other low back pain: Secondary | ICD-10-CM | POA: Diagnosis not present

## 2022-05-18 DIAGNOSIS — R2689 Other abnormalities of gait and mobility: Secondary | ICD-10-CM | POA: Diagnosis not present

## 2022-05-18 DIAGNOSIS — Z4789 Encounter for other orthopedic aftercare: Secondary | ICD-10-CM | POA: Diagnosis not present

## 2022-05-19 DIAGNOSIS — M549 Dorsalgia, unspecified: Secondary | ICD-10-CM | POA: Diagnosis not present

## 2022-05-19 DIAGNOSIS — Z7901 Long term (current) use of anticoagulants: Secondary | ICD-10-CM | POA: Diagnosis not present

## 2022-05-19 DIAGNOSIS — M79671 Pain in right foot: Secondary | ICD-10-CM | POA: Diagnosis not present

## 2022-05-19 DIAGNOSIS — I2699 Other pulmonary embolism without acute cor pulmonale: Secondary | ICD-10-CM | POA: Diagnosis not present

## 2022-05-19 DIAGNOSIS — R531 Weakness: Secondary | ICD-10-CM | POA: Diagnosis not present

## 2022-05-19 DIAGNOSIS — Z4789 Encounter for other orthopedic aftercare: Secondary | ICD-10-CM | POA: Diagnosis not present

## 2022-05-20 DIAGNOSIS — R2689 Other abnormalities of gait and mobility: Secondary | ICD-10-CM | POA: Diagnosis not present

## 2022-05-20 DIAGNOSIS — Z4789 Encounter for other orthopedic aftercare: Secondary | ICD-10-CM | POA: Diagnosis not present

## 2022-05-20 DIAGNOSIS — M4804 Spinal stenosis, thoracic region: Secondary | ICD-10-CM | POA: Diagnosis not present

## 2022-05-20 DIAGNOSIS — R278 Other lack of coordination: Secondary | ICD-10-CM | POA: Diagnosis not present

## 2022-05-20 DIAGNOSIS — M5459 Other low back pain: Secondary | ICD-10-CM | POA: Diagnosis not present

## 2022-05-20 DIAGNOSIS — M48061 Spinal stenosis, lumbar region without neurogenic claudication: Secondary | ICD-10-CM | POA: Diagnosis not present

## 2022-05-21 DIAGNOSIS — R278 Other lack of coordination: Secondary | ICD-10-CM | POA: Diagnosis not present

## 2022-05-21 DIAGNOSIS — Z4789 Encounter for other orthopedic aftercare: Secondary | ICD-10-CM | POA: Diagnosis not present

## 2022-05-21 DIAGNOSIS — M48061 Spinal stenosis, lumbar region without neurogenic claudication: Secondary | ICD-10-CM | POA: Diagnosis not present

## 2022-05-21 DIAGNOSIS — M5459 Other low back pain: Secondary | ICD-10-CM | POA: Diagnosis not present

## 2022-05-21 DIAGNOSIS — M4804 Spinal stenosis, thoracic region: Secondary | ICD-10-CM | POA: Diagnosis not present

## 2022-05-21 DIAGNOSIS — R2689 Other abnormalities of gait and mobility: Secondary | ICD-10-CM | POA: Diagnosis not present

## 2022-05-24 DIAGNOSIS — Z4789 Encounter for other orthopedic aftercare: Secondary | ICD-10-CM | POA: Diagnosis not present

## 2022-05-24 DIAGNOSIS — M4804 Spinal stenosis, thoracic region: Secondary | ICD-10-CM | POA: Diagnosis not present

## 2022-05-24 DIAGNOSIS — M5459 Other low back pain: Secondary | ICD-10-CM | POA: Diagnosis not present

## 2022-05-24 DIAGNOSIS — R2689 Other abnormalities of gait and mobility: Secondary | ICD-10-CM | POA: Diagnosis not present

## 2022-05-24 DIAGNOSIS — M48061 Spinal stenosis, lumbar region without neurogenic claudication: Secondary | ICD-10-CM | POA: Diagnosis not present

## 2022-05-24 DIAGNOSIS — R278 Other lack of coordination: Secondary | ICD-10-CM | POA: Diagnosis not present

## 2022-05-25 DIAGNOSIS — M6389 Disorders of muscle in diseases classified elsewhere, multiple sites: Secondary | ICD-10-CM | POA: Diagnosis not present

## 2022-05-25 DIAGNOSIS — M5459 Other low back pain: Secondary | ICD-10-CM | POA: Diagnosis not present

## 2022-05-25 DIAGNOSIS — Z4789 Encounter for other orthopedic aftercare: Secondary | ICD-10-CM | POA: Diagnosis not present

## 2022-05-25 DIAGNOSIS — M4804 Spinal stenosis, thoracic region: Secondary | ICD-10-CM | POA: Diagnosis not present

## 2022-05-25 DIAGNOSIS — R278 Other lack of coordination: Secondary | ICD-10-CM | POA: Diagnosis not present

## 2022-05-25 DIAGNOSIS — R2689 Other abnormalities of gait and mobility: Secondary | ICD-10-CM | POA: Diagnosis not present

## 2022-05-25 DIAGNOSIS — M48061 Spinal stenosis, lumbar region without neurogenic claudication: Secondary | ICD-10-CM | POA: Diagnosis not present

## 2022-05-26 DIAGNOSIS — R2689 Other abnormalities of gait and mobility: Secondary | ICD-10-CM | POA: Diagnosis not present

## 2022-05-26 DIAGNOSIS — M48061 Spinal stenosis, lumbar region without neurogenic claudication: Secondary | ICD-10-CM | POA: Diagnosis not present

## 2022-05-26 DIAGNOSIS — M5459 Other low back pain: Secondary | ICD-10-CM | POA: Diagnosis not present

## 2022-05-26 DIAGNOSIS — Z4789 Encounter for other orthopedic aftercare: Secondary | ICD-10-CM | POA: Diagnosis not present

## 2022-05-26 DIAGNOSIS — M6389 Disorders of muscle in diseases classified elsewhere, multiple sites: Secondary | ICD-10-CM | POA: Diagnosis not present

## 2022-05-26 DIAGNOSIS — R278 Other lack of coordination: Secondary | ICD-10-CM | POA: Diagnosis not present

## 2022-05-26 DIAGNOSIS — M4804 Spinal stenosis, thoracic region: Secondary | ICD-10-CM | POA: Diagnosis not present

## 2022-05-27 DIAGNOSIS — M6389 Disorders of muscle in diseases classified elsewhere, multiple sites: Secondary | ICD-10-CM | POA: Diagnosis not present

## 2022-05-27 DIAGNOSIS — M48061 Spinal stenosis, lumbar region without neurogenic claudication: Secondary | ICD-10-CM | POA: Diagnosis not present

## 2022-05-27 DIAGNOSIS — Z4789 Encounter for other orthopedic aftercare: Secondary | ICD-10-CM | POA: Diagnosis not present

## 2022-05-27 DIAGNOSIS — M5459 Other low back pain: Secondary | ICD-10-CM | POA: Diagnosis not present

## 2022-05-27 DIAGNOSIS — R2689 Other abnormalities of gait and mobility: Secondary | ICD-10-CM | POA: Diagnosis not present

## 2022-05-27 DIAGNOSIS — R278 Other lack of coordination: Secondary | ICD-10-CM | POA: Diagnosis not present

## 2022-05-27 DIAGNOSIS — M4804 Spinal stenosis, thoracic region: Secondary | ICD-10-CM | POA: Diagnosis not present

## 2022-05-28 DIAGNOSIS — M4804 Spinal stenosis, thoracic region: Secondary | ICD-10-CM | POA: Diagnosis not present

## 2022-05-28 DIAGNOSIS — R2689 Other abnormalities of gait and mobility: Secondary | ICD-10-CM | POA: Diagnosis not present

## 2022-05-28 DIAGNOSIS — M48061 Spinal stenosis, lumbar region without neurogenic claudication: Secondary | ICD-10-CM | POA: Diagnosis not present

## 2022-05-28 DIAGNOSIS — M5459 Other low back pain: Secondary | ICD-10-CM | POA: Diagnosis not present

## 2022-05-28 DIAGNOSIS — Z4789 Encounter for other orthopedic aftercare: Secondary | ICD-10-CM | POA: Diagnosis not present

## 2022-05-28 DIAGNOSIS — R278 Other lack of coordination: Secondary | ICD-10-CM | POA: Diagnosis not present

## 2022-05-31 DIAGNOSIS — M48061 Spinal stenosis, lumbar region without neurogenic claudication: Secondary | ICD-10-CM | POA: Diagnosis not present

## 2022-05-31 DIAGNOSIS — M4804 Spinal stenosis, thoracic region: Secondary | ICD-10-CM | POA: Diagnosis not present

## 2022-05-31 DIAGNOSIS — Z4789 Encounter for other orthopedic aftercare: Secondary | ICD-10-CM | POA: Diagnosis not present

## 2022-05-31 DIAGNOSIS — R2689 Other abnormalities of gait and mobility: Secondary | ICD-10-CM | POA: Diagnosis not present

## 2022-05-31 DIAGNOSIS — R278 Other lack of coordination: Secondary | ICD-10-CM | POA: Diagnosis not present

## 2022-05-31 DIAGNOSIS — M5459 Other low back pain: Secondary | ICD-10-CM | POA: Diagnosis not present

## 2022-06-01 DIAGNOSIS — M48061 Spinal stenosis, lumbar region without neurogenic claudication: Secondary | ICD-10-CM | POA: Diagnosis not present

## 2022-06-01 DIAGNOSIS — M5459 Other low back pain: Secondary | ICD-10-CM | POA: Diagnosis not present

## 2022-06-01 DIAGNOSIS — Z4789 Encounter for other orthopedic aftercare: Secondary | ICD-10-CM | POA: Diagnosis not present

## 2022-06-01 DIAGNOSIS — R2689 Other abnormalities of gait and mobility: Secondary | ICD-10-CM | POA: Diagnosis not present

## 2022-06-01 DIAGNOSIS — M4804 Spinal stenosis, thoracic region: Secondary | ICD-10-CM | POA: Diagnosis not present

## 2022-06-01 DIAGNOSIS — R278 Other lack of coordination: Secondary | ICD-10-CM | POA: Diagnosis not present

## 2022-06-01 DIAGNOSIS — M6389 Disorders of muscle in diseases classified elsewhere, multiple sites: Secondary | ICD-10-CM | POA: Diagnosis not present

## 2022-06-02 DIAGNOSIS — M4804 Spinal stenosis, thoracic region: Secondary | ICD-10-CM | POA: Diagnosis not present

## 2022-06-02 DIAGNOSIS — M6389 Disorders of muscle in diseases classified elsewhere, multiple sites: Secondary | ICD-10-CM | POA: Diagnosis not present

## 2022-06-02 DIAGNOSIS — R278 Other lack of coordination: Secondary | ICD-10-CM | POA: Diagnosis not present

## 2022-06-02 DIAGNOSIS — M48061 Spinal stenosis, lumbar region without neurogenic claudication: Secondary | ICD-10-CM | POA: Diagnosis not present

## 2022-06-02 DIAGNOSIS — Z4789 Encounter for other orthopedic aftercare: Secondary | ICD-10-CM | POA: Diagnosis not present

## 2022-06-03 ENCOUNTER — Non-Acute Institutional Stay (SKILLED_NURSING_FACILITY): Payer: Medicare Other | Admitting: Adult Health

## 2022-06-03 ENCOUNTER — Encounter: Payer: Self-pay | Admitting: Adult Health

## 2022-06-03 DIAGNOSIS — K5901 Slow transit constipation: Secondary | ICD-10-CM | POA: Diagnosis not present

## 2022-06-03 DIAGNOSIS — R1031 Right lower quadrant pain: Secondary | ICD-10-CM | POA: Diagnosis not present

## 2022-06-03 DIAGNOSIS — Z4789 Encounter for other orthopedic aftercare: Secondary | ICD-10-CM | POA: Diagnosis not present

## 2022-06-03 DIAGNOSIS — M5459 Other low back pain: Secondary | ICD-10-CM | POA: Diagnosis not present

## 2022-06-03 DIAGNOSIS — M4804 Spinal stenosis, thoracic region: Secondary | ICD-10-CM | POA: Diagnosis not present

## 2022-06-03 DIAGNOSIS — R278 Other lack of coordination: Secondary | ICD-10-CM | POA: Diagnosis not present

## 2022-06-03 DIAGNOSIS — M6389 Disorders of muscle in diseases classified elsewhere, multiple sites: Secondary | ICD-10-CM | POA: Diagnosis not present

## 2022-06-03 DIAGNOSIS — M48061 Spinal stenosis, lumbar region without neurogenic claudication: Secondary | ICD-10-CM | POA: Diagnosis not present

## 2022-06-03 DIAGNOSIS — I10 Essential (primary) hypertension: Secondary | ICD-10-CM | POA: Diagnosis not present

## 2022-06-03 DIAGNOSIS — R2689 Other abnormalities of gait and mobility: Secondary | ICD-10-CM | POA: Diagnosis not present

## 2022-06-03 LAB — BASIC METABOLIC PANEL
BUN: 29 — AB (ref 4–21)
CO2: 25 — AB (ref 13–22)
Chloride: 105 (ref 99–108)
Creatinine: 0.5 (ref 0.5–1.1)
Glucose: 99
Potassium: 3.9 mEq/L (ref 3.5–5.1)
Sodium: 139 (ref 137–147)

## 2022-06-03 LAB — COMPREHENSIVE METABOLIC PANEL
Albumin: 3.5 (ref 3.5–5.0)
Calcium: 9.9 (ref 8.7–10.7)
Globulin: 2.3

## 2022-06-03 LAB — HEPATIC FUNCTION PANEL
ALT: 16 U/L (ref 7–35)
AST: 18 (ref 13–35)
Alkaline Phosphatase: 97 (ref 25–125)
Bilirubin, Total: 0.3

## 2022-06-03 NOTE — Progress Notes (Signed)
Location:  [facility] Room Number: 151- A Place of Service:  SNF [phone]) Provider:  Royal Hawthorn, NP   Patient Care Team: Donnajean Lopes, MD as PCP - General (Internal Medicine)  Extended Emergency Contact Information Primary Emergency Contact: Rodier,thomas Home Phone: [phone] Relation: Son Secondary Emergency Contact: Rollison,william Home Phone: [phone] Relation: Son  Code Status:  Full Code  Goals of care: Advanced Directive information    05/13/2022   10:42 AM  Advanced Directives  Does Patient Have a Medical Advance Directive? Yes  Type of Advance Directive Living will;Healthcare Power of Attorney  Does patient want to make changes to medical advance directive? No - Patient declined  Copy of [place] in Chart? Yes - validated most recent copy scanned in chart (See row information)     Chief Complaint  Patient presents with   Acute Visit    Abdominal pain     HPI:  Pt is a 81 y.o. female seen today for an acute visit for abd pain.  Patient was admitted in the hospital from 5/3 to 05/11 and underwent removal of prior instrumentation and insertion of interbody biomechanical device in her spine from T11-L4. She reports mild to moderate abd pain mid right and lower quad. She does not associate it with food. Does report a hx of irritable bowel with eating lettuce which she has avoided. She is not using oxycodone. Working with therapy and attempting to be more active and ambulate. Denies any nausea or vomiting. Denies fever or dysuria. Says the pain has been there since surgery.  LBM 6/8 Has lost 3 lbs since admission. Reports appetite is unchanged. She thinks its gas and she feels bloated.   Past Medical History:  Diagnosis Date   Arthritis    OA   GERD (gastroesophageal reflux disease)    Hematuria    idiopathic- [facility]   Hyperlipemia    Hypertension    Hypothyroidism    Past  Surgical History:  Procedure Laterality Date   ABDOMINAL HYSTERECTOMY     APPENDECTOMY     BACK SURGERY     lumbar   EYE SURGERY     bilateral cataract extraction with IOL   GASTROC RECESSION EXTREMITY Left 02/23/2018   Procedure: Left Gastroc Recession;  Surgeon: Wylene Simmer, MD;  Location: [facility];  Service: Orthopedics;  Laterality: Left;   HAND SURGERY     left thumb MP   JOINT REPLACEMENT     right knee   PARTIAL KNEE ARTHROPLASTY  11/14/2012   Procedure: UNICOMPARTMENTAL KNEE;  Surgeon: Mauri Pole, MD;  Location: [facility];  Service: Orthopedics;  Laterality: Left;   TARSAL METATARSAL ARTHRODESIS Left 02/23/2018   Procedure: Left First and Second Tarsometatarsal Arthrodesis;  Surgeon: Wylene Simmer, MD;  Location: [facility];  Service: Orthopedics;  Laterality: Left;   thumb surgery     left   VAGINAL HYSTERECTOMY      Allergies  Allergen Reactions   Statins    Zetia [Ezetimibe]     Outpatient Encounter Medications as of 06/03/2022  Medication Sig   acetaminophen (TYLENOL) 500 MG tablet Take 1,000 mg by mouth 3 (three) times daily as needed.   amLODipine (NORVASC) 2.5 MG tablet Take 2.5 mg by mouth daily before breakfast.   apixaban (ELIQUIS) 5 MG TABS tablet Take 5 mg by mouth 2 (two) times daily.   Biotin 5000 MCG CAPS Take 1 capsule by mouth daily.  Calcium Citrate-Vitamin D (CALCIUM CITRATE + D) 315-5 MG-MCG TABS Take 2 tablets by mouth in the morning, at noon, and at bedtime.   cholecalciferol (VITAMIN D3) 25 MCG (1000 UNIT) tablet Take 1,000 Units by mouth daily.   Evening Primrose Oil CAPS Take 1 capsule by mouth daily at 12 noon.   levothyroxine (SYNTHROID, LEVOTHROID) 25 MCG tablet Take 50 mcg by mouth daily before breakfast.   lidocaine (LMX) 4 % cream Apply 1 application. topically 2 (two) times daily.   Magnesium Citrate 100 MG TABS Take 150 mg by mouth every morning.   olmesartan-hydrochlorothiazide (BENICAR HCT) 40-12.5 MG  per tablet Take 1 tablet by mouth daily before breakfast.   omega-3 acid ethyl esters (LOVAZA) 1 g capsule Take 2 g by mouth 3 (three) times daily.   oxyCODONE (OXY IR/ROXICODONE) 5 MG immediate release tablet Take 5 mg by mouth 2 (two) times daily as needed for severe pain.   polyethylene glycol (MIRALAX / GLYCOLAX) 17 g packet Take 17 g by mouth daily.   potassium chloride SA (K-DUR,KLOR-CON) 20 MEQ tablet Take 20 mEq by mouth daily before breakfast.   sennosides-docusate sodium (SENOKOT-S) 8.6-50 MG tablet Take 1 tablet by mouth 2 (two) times daily.   Vitamin D, Ergocalciferol, (DRISDOL) 50000 UNITS CAPS Take 50,000 Units by mouth every 30 (thirty) days. On the first of the month   [DISCONTINUED] methocarbamol (ROBAXIN) 750 MG tablet Take 750 mg by mouth 3 (three) times daily.   No facility-administered encounter medications on file as of 06/03/2022.    Review of Systems  Constitutional:  Positive for activity change. Negative for appetite change, chills, diaphoresis, fatigue, fever and unexpected weight change.  HENT:  Negative for congestion.   Respiratory:  Negative for cough, shortness of breath and wheezing.   Cardiovascular:  Negative for chest pain, palpitations and leg swelling.  Gastrointestinal:  Positive for abdominal pain. Negative for abdominal distention, blood in stool, constipation, diarrhea, nausea and vomiting.  Genitourinary:  Negative for difficulty urinating and dysuria.  Musculoskeletal:  Positive for back pain and gait problem. Negative for arthralgias, joint swelling and myalgias.  Neurological:  Negative for dizziness, tremors, seizures, syncope, facial asymmetry, speech difficulty, weakness, light-headedness, numbness and headaches.  Psychiatric/Behavioral:  Negative for agitation, behavioral problems and confusion.     Immunization History  Administered Date(s) Administered   Influenza,inj,quad, With Preservative 08/29/2017   Influenza-Unspecified 09/26/2016    Moderna Covid-19 Vaccine Bivalent Booster 51yr & up 10/09/2021   PFIZER(Purple Top)SARS-COV-2 Vaccination 09/07/2019, 09/27/2019, 04/16/2020   Tdap 12/27/2008   Zoster Recombinat (Shingrix) 07/26/2017, 01/06/2018   Zoster, Live 01/15/2010   Pertinent  Health Maintenance Due  Topic Date Due   INFLUENZA VACCINE  07/27/2022   DEXA SCAN  Completed       No data to display         Functional Status Survey:    Vitals:   06/03/22 1049  BP: 136/67  Pulse: 64  Resp: 14  Temp: 98.6 F (37 C)  SpO2: 95%  Weight: 123 lb 12.8 oz (56.2 kg)  Height: '5\' 4"'$  (1.626 m)   Body mass index is 21.25 kg/m. Physical Exam Vitals and nursing note reviewed.  Constitutional:      General: She is not in acute distress.    Appearance: She is not diaphoretic.  HENT:     Head: Normocephalic and atraumatic.  Neck:     Vascular: No JVD.  Cardiovascular:     Rate and Rhythm: Normal rate and regular rhythm.  Heart sounds: No murmur heard. Pulmonary:     Effort: Pulmonary effort is normal. No respiratory distress.     Breath sounds: Normal breath sounds. No wheezing.  Abdominal:     General: There is distension (mild).     Palpations: Abdomen is soft. There is no mass.     Tenderness: There is no abdominal tenderness. There is no right CVA tenderness, left CVA tenderness, guarding or rebound.     Hernia: No hernia is present.     Comments: BS present x 4, slightly hypoactive.   Skin:    General: Skin is warm and dry.  Neurological:     Mental Status: She is alert and oriented to person, place, and time.     Labs reviewed: Recent Labs    05/17/22 0000  NA 137  K 3.9  CL 104  CO2 23*  BUN 30*  CREATININE 0.5  CALCIUM 9.5   Recent Labs    05/17/22 0000  AST 12*  ALT 8  ALKPHOS 118  ALBUMIN 3.2*   Recent Labs    05/17/22 0000  WBC 4.5  HGB 10.7*  HCT 31*  PLT 423*   No results found for: "TSH" No results found for: "HGBA1C" No results found for: "CHOL", "HDL",  "LDLCALC", "LDLDIRECT", "TRIG", "CHOLHDL"  Significant Diagnostic Results in last 30 days:  No results found.  Assessment/Plan  1. Right lower quadrant abdominal pain Mild Using lidocaine patch LFTs are WNL No signs of renal failure or dehydration on CMP Bowels sounds are mildly sluggish. Abd soft and not tender.  Does not appear acutely ill  Will try simethicone tid Check KUB to rule out ileus. Encourage mobility as tolerated Avoid oxycodone if possible  2. Constipation Bowels are moving regularly with miralax and senokot s  Family/ staff Communication: resident   Labs/tests ordered:  CMP KUB

## 2022-06-04 DIAGNOSIS — R109 Unspecified abdominal pain: Secondary | ICD-10-CM | POA: Diagnosis not present

## 2022-06-07 DIAGNOSIS — H04123 Dry eye syndrome of bilateral lacrimal glands: Secondary | ICD-10-CM | POA: Diagnosis not present

## 2022-06-07 DIAGNOSIS — M5459 Other low back pain: Secondary | ICD-10-CM | POA: Diagnosis not present

## 2022-06-07 DIAGNOSIS — R2689 Other abnormalities of gait and mobility: Secondary | ICD-10-CM | POA: Diagnosis not present

## 2022-06-07 DIAGNOSIS — R278 Other lack of coordination: Secondary | ICD-10-CM | POA: Diagnosis not present

## 2022-06-07 DIAGNOSIS — Z4789 Encounter for other orthopedic aftercare: Secondary | ICD-10-CM | POA: Diagnosis not present

## 2022-06-07 DIAGNOSIS — H0100A Unspecified blepharitis right eye, upper and lower eyelids: Secondary | ICD-10-CM | POA: Diagnosis not present

## 2022-06-07 DIAGNOSIS — M4804 Spinal stenosis, thoracic region: Secondary | ICD-10-CM | POA: Diagnosis not present

## 2022-06-07 DIAGNOSIS — M48061 Spinal stenosis, lumbar region without neurogenic claudication: Secondary | ICD-10-CM | POA: Diagnosis not present

## 2022-06-07 DIAGNOSIS — H0100B Unspecified blepharitis left eye, upper and lower eyelids: Secondary | ICD-10-CM | POA: Diagnosis not present

## 2022-06-08 ENCOUNTER — Encounter: Payer: Self-pay | Admitting: Orthopedic Surgery

## 2022-06-08 ENCOUNTER — Other Ambulatory Visit: Payer: Self-pay | Admitting: Orthopedic Surgery

## 2022-06-08 ENCOUNTER — Non-Acute Institutional Stay (SKILLED_NURSING_FACILITY): Payer: Medicare Other | Admitting: Orthopedic Surgery

## 2022-06-08 DIAGNOSIS — I82411 Acute embolism and thrombosis of right femoral vein: Secondary | ICD-10-CM | POA: Diagnosis not present

## 2022-06-08 DIAGNOSIS — R1031 Right lower quadrant pain: Secondary | ICD-10-CM

## 2022-06-08 DIAGNOSIS — I2694 Multiple subsegmental pulmonary emboli without acute cor pulmonale: Secondary | ICD-10-CM | POA: Diagnosis not present

## 2022-06-08 DIAGNOSIS — E039 Hypothyroidism, unspecified: Secondary | ICD-10-CM | POA: Diagnosis not present

## 2022-06-08 DIAGNOSIS — Z4789 Encounter for other orthopedic aftercare: Secondary | ICD-10-CM | POA: Diagnosis not present

## 2022-06-08 DIAGNOSIS — I10 Essential (primary) hypertension: Secondary | ICD-10-CM

## 2022-06-08 DIAGNOSIS — M4805 Spinal stenosis, thoracolumbar region: Secondary | ICD-10-CM | POA: Diagnosis not present

## 2022-06-08 DIAGNOSIS — M5459 Other low back pain: Secondary | ICD-10-CM | POA: Diagnosis not present

## 2022-06-08 DIAGNOSIS — R2689 Other abnormalities of gait and mobility: Secondary | ICD-10-CM | POA: Diagnosis not present

## 2022-06-08 DIAGNOSIS — M4804 Spinal stenosis, thoracic region: Secondary | ICD-10-CM | POA: Diagnosis not present

## 2022-06-08 DIAGNOSIS — K5901 Slow transit constipation: Secondary | ICD-10-CM | POA: Diagnosis not present

## 2022-06-08 DIAGNOSIS — M48061 Spinal stenosis, lumbar region without neurogenic claudication: Secondary | ICD-10-CM | POA: Diagnosis not present

## 2022-06-08 DIAGNOSIS — R278 Other lack of coordination: Secondary | ICD-10-CM | POA: Diagnosis not present

## 2022-06-08 MED ORDER — OLMESARTAN MEDOXOMIL-HCTZ 40-12.5 MG PO TABS: 1.0000 | ORAL_TABLET | Freq: Every day | ORAL | 0 refills | Status: DC

## 2022-06-08 MED ORDER — LEVOTHYROXINE SODIUM 25 MCG PO TABS: 50.0000 ug | ORAL_TABLET | Freq: Every day | ORAL | 0 refills | Status: DC

## 2022-06-08 MED ORDER — SIMETHICONE 125 MG PO CHEW: 125.0000 mg | CHEWABLE_TABLET | ORAL | 0 refills | Status: DC | PRN

## 2022-06-08 MED ORDER — SENNA-DOCUSATE SODIUM 8.6-50 MG PO TABS: 1.0000 | ORAL_TABLET | Freq: Two times a day (BID) | ORAL | 0 refills | Status: DC

## 2022-06-08 MED ORDER — POTASSIUM CHLORIDE CRYS ER 20 MEQ PO TBCR: 20.0000 meq | EXTENDED_RELEASE_TABLET | Freq: Every day | ORAL | 0 refills | Status: DC

## 2022-06-08 MED ORDER — AMLODIPINE BESYLATE 2.5 MG PO TABS: 2.5000 mg | ORAL_TABLET | Freq: Every day | ORAL | 0 refills | Status: DC

## 2022-06-08 MED ORDER — APIXABAN 5 MG PO TABS: 5.0000 mg | ORAL_TABLET | Freq: Two times a day (BID) | ORAL | 1 refills | Status: DC

## 2022-06-08 NOTE — Progress Notes (Signed)
Location:  [facility] Room Number: 151/A Place of Service:  SNF [phone])  Provider: Yvonna Alanis, NP   PCP: Virgie Dad, MD Patient Care Team: Virgie Dad, MD as PCP - General (Internal Medicine)  Extended Emergency Contact Information Primary Emergency Contact: Lio,thomas Home Phone: [phone] Relation: Son Secondary Emergency Contact: Brosh,william Home Phone: [phone] Relation: Son  Code Status: Full code Goals of care:  Advanced Directive information    06/08/2022   11:20 AM  Advanced Directives  Does Patient Have a Medical Advance Directive? Yes  Type of Advance Directive Living will;Healthcare Power of Attorney  Does patient want to make changes to medical advance directive? No - Patient declined  Copy of [place] in Chart? Yes - validated most recent copy scanned in chart (See row information)     Allergies  Allergen Reactions   Statins    Zetia [Ezetimibe]     Chief Complaint  Patient presents with   Discharge Note    Discharge     HPI:  81 y.o. female  seen today for discharge evaluation.   She currently resides on the skilled nursing unit at [facility] PMH: HTN, lumbar radiculopathy, anemia, gait abnormality.   Hospitalized 05/03-05/11 due to elective spinal surgery. She had previous instrument removed and new interbody biomechanical device placed from T11-L4. Tolerated procedure well. Her stay was complicated due to multiple PE/DVT- right common vein. She currently remains on Eliquis 5 mg BID x 3 months. She continues to work with PT/OT. Ambulating about 700 ft with one rest break. She was using Climax prior to surgery. No recent falls or injuries. She continues to have back pain. Pain stimulated with movement. She has not taken oxycodone for a few days. Using daily lidocaine patches and tylenol prn.06/08 she reported RUQ pain. KUB indicated some fecal matter and gas in colon. She was  started on simethicone prn and symptoms have subsided. LBM 06/11. She is scheduled to follow up with neurosurgery 07/08/2022.   She plans to discharge home 06/11/2022. Home health PT/OT ordered. We discussed her having 24/hr care for the first few days home. She does not want to hire home health aide. Social worker plans to discuss with sons.     Past Medical History:  Diagnosis Date   Arthritis    OA   GERD (gastroesophageal reflux disease)    Hematuria    idiopathic- [facility]   Hyperlipemia    Hypertension    Hypothyroidism     Past Surgical History:  Procedure Laterality Date   ABDOMINAL HYSTERECTOMY     APPENDECTOMY     BACK SURGERY     lumbar   EYE SURGERY     bilateral cataract extraction with IOL   GASTROC RECESSION EXTREMITY Left 02/23/2018   Procedure: Left Gastroc Recession;  Surgeon: Wylene Simmer, MD;  Location: [facility];  Service: Orthopedics;  Laterality: Left;   HAND SURGERY     left thumb MP   JOINT REPLACEMENT     right knee   PARTIAL KNEE ARTHROPLASTY  11/14/2012   Procedure: UNICOMPARTMENTAL KNEE;  Surgeon: Mauri Pole, MD;  Location: [facility];  Service: Orthopedics;  Laterality: Left;   TARSAL METATARSAL ARTHRODESIS Left 02/23/2018   Procedure: Left First and Second Tarsometatarsal Arthrodesis;  Surgeon: Wylene Simmer, MD;  Location: [facility];  Service: Orthopedics;  Laterality: Left;   thumb surgery     left   VAGINAL HYSTERECTOMY  reports that she quit smoking about 33 years ago. Her smoking use included cigarettes. She has never used smokeless tobacco. She reports current alcohol use of about 7.0 standard drinks of alcohol per week. She reports that she does not use drugs. Social History   Socioeconomic History   Marital status: Widowed    Spouse name: Not on file   Number of children: Not on file   Years of education: Not on file   Highest education level: Not on file  Occupational History    Not on file  Tobacco Use   Smoking status: Former    Types: Cigarettes    Quit date: 12/27/1988    Years since quitting: 33.4   Smokeless tobacco: Never  Vaping Use   Vaping Use: Never used  Substance and Sexual Activity   Alcohol use: Yes    Alcohol/week: 7.0 standard drinks of alcohol    Types: 7 Glasses of wine per week    Comment: wine nightly   1 glass   Drug use: No   Sexual activity: Not on file  Other Topics Concern   Not on file  Social History Narrative   Not on file   Social Determinants of Health   Financial Resource Strain: Not on file  Food Insecurity: Not on file  Transportation Needs: Not on file  Physical Activity: Not on file  Stress: Not on file  Social Connections: Not on file  Intimate Partner Violence: Not on file   Functional Status Survey:    Allergies  Allergen Reactions   Statins    Zetia [Ezetimibe]     Pertinent  Health Maintenance Due  Topic Date Due   INFLUENZA VACCINE  07/27/2022   DEXA SCAN  Completed    Medications: Outpatient Encounter Medications as of 06/08/2022  Medication Sig   acetaminophen (TYLENOL) 500 MG tablet Take 1,000 mg by mouth 2 (two) times daily as needed for mild pain.   amLODipine (NORVASC) 2.5 MG tablet Take 2.5 mg by mouth daily before breakfast.   apixaban (ELIQUIS) 5 MG TABS tablet Take 5 mg by mouth 2 (two) times daily.   Biotin 5000 MCG CAPS Take 1 capsule by mouth daily. Place 1 tablet under the tongue once daily   Calcium Citrate-Vitamin D (CALCIUM CITRATE + D) 315-5 MG-MCG TABS Take 2 tablets by mouth in the morning and at bedtime.   cholecalciferol (VITAMIN D3) 25 MCG (1000 UNIT) tablet Take 1,000 Units by mouth daily.   Evening Primrose Oil CAPS Take 1 capsule by mouth daily at 12 noon.   levothyroxine (SYNTHROID, LEVOTHROID) 25 MCG tablet Take 50 mcg by mouth daily before breakfast.   Magnesium Citrate 100 MG TABS Take 150 mg by mouth every morning.   olmesartan-hydrochlorothiazide (BENICAR HCT)  40-12.5 MG per tablet Take 1 tablet by mouth daily before breakfast. For HTN   omega-3 acid ethyl esters (LOVAZA) 1 g capsule Take 2 g by mouth 3 (three) times daily.   oxyCODONE (OXY IR/ROXICODONE) 5 MG immediate release tablet Take 5 mg by mouth 2 (two) times daily as needed for severe pain.   potassium chloride SA (K-DUR,KLOR-CON) 20 MEQ tablet Take 20 mEq by mouth daily before breakfast.   sennosides-docusate sodium (SENOKOT-S) 8.6-50 MG tablet Take 1 tablet by mouth 2 (two) times daily.   simethicone (MYLICON) 144 MG chewable tablet Chew 125 mg by mouth as needed for flatulence.   Vitamin D, Ergocalciferol, (DRISDOL) 50000 UNITS CAPS Take 50,000 Units by mouth. Once a day on  Monday   No facility-administered encounter medications on file as of 06/08/2022.    Review of Systems  Constitutional:  Negative for activity change, appetite change, chills, fatigue and fever.  HENT:  Negative for congestion and trouble swallowing.   Eyes:  Negative for discharge and itching.  Respiratory:  Negative for cough, shortness of breath and wheezing.   Cardiovascular:  Positive for leg swelling. Negative for chest pain.  Gastrointestinal:  Positive for constipation. Negative for abdominal distention, abdominal pain, diarrhea, nausea and vomiting.  Genitourinary:  Negative for dysuria, frequency and hematuria.  Musculoskeletal:  Positive for arthralgias, back pain and gait problem.  Skin:  Negative for wound.  Neurological:  Positive for weakness. Negative for dizziness and headaches.  Psychiatric/Behavioral:  Negative for confusion, dysphoric mood and sleep disturbance. The patient is not nervous/anxious.     Vitals:   06/08/22 1110  BP: (!) 133/57  Pulse: 61  Resp: 16  Temp: 98 F (36.7 C)  SpO2: 97%  Weight: 124 lb 3.2 oz (56.3 kg)  Height: '5\' 4"'$  (1.626 m)   Body mass index is 21.32 kg/m. Physical Exam Vitals reviewed.  Constitutional:      General: She is not in acute distress. HENT:      Head: Normocephalic.  Eyes:     General:        Right eye: No discharge.        Left eye: No discharge.     Extraocular Movements: Extraocular movements intact.     Pupils: Pupils are equal, round, and reactive to light.  Cardiovascular:     Rate and Rhythm: Normal rate and regular rhythm.     Pulses: Normal pulses.     Heart sounds: Normal heart sounds.  Pulmonary:     Effort: Pulmonary effort is normal. No respiratory distress.     Breath sounds: Normal breath sounds. No wheezing.  Abdominal:     General: Bowel sounds are normal. There is no distension.     Palpations: Abdomen is soft. There is no mass.     Tenderness: There is no abdominal tenderness. There is no guarding or rebound.     Hernia: No hernia is present.  Musculoskeletal:     Cervical back: Neck supple.     Right lower leg: Edema present.     Left lower leg: Edema present.     Comments: Non pitting, ted hose on  Skin:    General: Skin is warm and dry.     Capillary Refill: Capillary refill takes less than 2 seconds.     Comments: Spinal incision closed/healed  Neurological:     General: No focal deficit present.     Mental Status: She is alert and oriented to person, place, and time.     Motor: Weakness present.     Gait: Gait abnormal.     Comments: PWC  Psychiatric:        Mood and Affect: Mood normal.        Behavior: Behavior normal.     Labs reviewed: Basic Metabolic Panel: Recent Labs    05/17/22 0000 06/03/22 0000  NA 137 139  K 3.9 3.9  CL 104 105  CO2 23* 25*  BUN 30* 29*  CREATININE 0.5 0.5  CALCIUM 9.5 9.9   Liver Function Tests: Recent Labs    05/17/22 0000 06/03/22 0000  AST 12* 18  ALT 8 16  ALKPHOS 118 97  ALBUMIN 3.2* 3.5   No results for input(s): "LIPASE", "AMYLASE"  in the last 8760 hours. No results for input(s): "AMMONIA" in the last 8760 hours. CBC: Recent Labs    05/17/22 0000  WBC 4.5  HGB 10.7*  HCT 31*  PLT 423*   Cardiac Enzymes: No results for  input(s): "CKTOTAL", "CKMB", "CKMBINDEX", "TROPONINI" in the last 8760 hours. BNP: Invalid input(s): "POCBNP" CBG: No results for input(s): "GLUCAP" in the last 8760 hours.  Procedures and Imaging Studies During Stay: No results found.  Assessment/Plan:   1. Spinal stenosis of thoracolumbar region - followed by neurosurgery- f/u 07/08/2022 - s/p removal instrument with insertion of biomechanical device T12-L4 - pain controlled with lidocaine patches and tylenol   2. Multiple subsegmental pulmonary emboli without acute cor pulmonale (HCC) - asymptomatic - postop complication 36/46-80/32 - multiple PE/DVT- right common vein - started on Eliquis 5 mg bid x 3 month  3. Acute deep vein thrombosis (DVT) of femoral vein of right lower extremity (HCC) - see above - cont ted hose   4. Primary hypertension - controlled with Benicar  5. Acquired hypothyroidism - TSH stable - cont levothyroxine  6. Right lower quadrant abdominal pain - KUB noted some fecal matter and gas in colon - symptoms improved with simethicone prn  7. Slow transit constipation - LBM 06/11, abdomen soft - cont miralax and senna    Patient is being discharged with the following home health services:  PT/OT  Patient is being discharged with the following durable medical equipment: None   Patient has been advised to f/u with their PCP in 1-2 weeks to for a transitions of care visit.  Social services at their facility was responsible for arranging this appointment.  Pt was provided with adequate prescriptions of noncontrolled medications to reach the scheduled appointment .  For controlled substances, a limited supply was provided as appropriate for the individual patient.  If the pt normally receives these medications from a pain clinic or has a contract with another physician, these medications should be received from that clinic or physician only).    Future labs/tests needed:  cbc/diff, bmp with PCP

## 2022-06-09 DIAGNOSIS — R278 Other lack of coordination: Secondary | ICD-10-CM | POA: Diagnosis not present

## 2022-06-09 DIAGNOSIS — Z4789 Encounter for other orthopedic aftercare: Secondary | ICD-10-CM | POA: Diagnosis not present

## 2022-06-09 DIAGNOSIS — M4804 Spinal stenosis, thoracic region: Secondary | ICD-10-CM | POA: Diagnosis not present

## 2022-06-09 DIAGNOSIS — M6389 Disorders of muscle in diseases classified elsewhere, multiple sites: Secondary | ICD-10-CM | POA: Diagnosis not present

## 2022-06-09 DIAGNOSIS — M48061 Spinal stenosis, lumbar region without neurogenic claudication: Secondary | ICD-10-CM | POA: Diagnosis not present

## 2022-06-10 DIAGNOSIS — M48061 Spinal stenosis, lumbar region without neurogenic claudication: Secondary | ICD-10-CM | POA: Diagnosis not present

## 2022-06-10 DIAGNOSIS — M5459 Other low back pain: Secondary | ICD-10-CM | POA: Diagnosis not present

## 2022-06-10 DIAGNOSIS — Z4789 Encounter for other orthopedic aftercare: Secondary | ICD-10-CM | POA: Diagnosis not present

## 2022-06-10 DIAGNOSIS — M4804 Spinal stenosis, thoracic region: Secondary | ICD-10-CM | POA: Diagnosis not present

## 2022-06-10 DIAGNOSIS — R278 Other lack of coordination: Secondary | ICD-10-CM | POA: Diagnosis not present

## 2022-06-10 DIAGNOSIS — R2689 Other abnormalities of gait and mobility: Secondary | ICD-10-CM | POA: Diagnosis not present

## 2022-06-10 DIAGNOSIS — M6389 Disorders of muscle in diseases classified elsewhere, multiple sites: Secondary | ICD-10-CM | POA: Diagnosis not present

## 2022-06-14 DIAGNOSIS — M4804 Spinal stenosis, thoracic region: Secondary | ICD-10-CM | POA: Diagnosis not present

## 2022-06-14 DIAGNOSIS — R2689 Other abnormalities of gait and mobility: Secondary | ICD-10-CM | POA: Diagnosis not present

## 2022-06-14 DIAGNOSIS — M5459 Other low back pain: Secondary | ICD-10-CM | POA: Diagnosis not present

## 2022-06-14 DIAGNOSIS — M48061 Spinal stenosis, lumbar region without neurogenic claudication: Secondary | ICD-10-CM | POA: Diagnosis not present

## 2022-06-14 DIAGNOSIS — Z4789 Encounter for other orthopedic aftercare: Secondary | ICD-10-CM | POA: Diagnosis not present

## 2022-06-14 DIAGNOSIS — R278 Other lack of coordination: Secondary | ICD-10-CM | POA: Diagnosis not present

## 2022-06-15 DIAGNOSIS — M4804 Spinal stenosis, thoracic region: Secondary | ICD-10-CM | POA: Diagnosis not present

## 2022-06-15 DIAGNOSIS — M6389 Disorders of muscle in diseases classified elsewhere, multiple sites: Secondary | ICD-10-CM | POA: Diagnosis not present

## 2022-06-15 DIAGNOSIS — L602 Onychogryphosis: Secondary | ICD-10-CM | POA: Diagnosis not present

## 2022-06-15 DIAGNOSIS — M48061 Spinal stenosis, lumbar region without neurogenic claudication: Secondary | ICD-10-CM | POA: Diagnosis not present

## 2022-06-15 DIAGNOSIS — R278 Other lack of coordination: Secondary | ICD-10-CM | POA: Diagnosis not present

## 2022-06-15 DIAGNOSIS — Q6689 Other  specified congenital deformities of feet: Secondary | ICD-10-CM | POA: Diagnosis not present

## 2022-06-15 DIAGNOSIS — Z4789 Encounter for other orthopedic aftercare: Secondary | ICD-10-CM | POA: Diagnosis not present

## 2022-06-18 DIAGNOSIS — R300 Dysuria: Secondary | ICD-10-CM | POA: Diagnosis not present

## 2022-06-21 DIAGNOSIS — R2689 Other abnormalities of gait and mobility: Secondary | ICD-10-CM | POA: Diagnosis not present

## 2022-06-21 DIAGNOSIS — M4804 Spinal stenosis, thoracic region: Secondary | ICD-10-CM | POA: Diagnosis not present

## 2022-06-21 DIAGNOSIS — M5459 Other low back pain: Secondary | ICD-10-CM | POA: Diagnosis not present

## 2022-06-21 DIAGNOSIS — M48061 Spinal stenosis, lumbar region without neurogenic claudication: Secondary | ICD-10-CM | POA: Diagnosis not present

## 2022-06-21 DIAGNOSIS — Z4789 Encounter for other orthopedic aftercare: Secondary | ICD-10-CM | POA: Diagnosis not present

## 2022-06-21 DIAGNOSIS — R278 Other lack of coordination: Secondary | ICD-10-CM | POA: Diagnosis not present

## 2022-06-22 DIAGNOSIS — M4804 Spinal stenosis, thoracic region: Secondary | ICD-10-CM | POA: Diagnosis not present

## 2022-06-22 DIAGNOSIS — R278 Other lack of coordination: Secondary | ICD-10-CM | POA: Diagnosis not present

## 2022-06-22 DIAGNOSIS — Z4789 Encounter for other orthopedic aftercare: Secondary | ICD-10-CM | POA: Diagnosis not present

## 2022-06-22 DIAGNOSIS — M48061 Spinal stenosis, lumbar region without neurogenic claudication: Secondary | ICD-10-CM | POA: Diagnosis not present

## 2022-06-22 DIAGNOSIS — M6389 Disorders of muscle in diseases classified elsewhere, multiple sites: Secondary | ICD-10-CM | POA: Diagnosis not present

## 2022-06-28 DIAGNOSIS — Z4789 Encounter for other orthopedic aftercare: Secondary | ICD-10-CM | POA: Diagnosis not present

## 2022-06-28 DIAGNOSIS — M4804 Spinal stenosis, thoracic region: Secondary | ICD-10-CM | POA: Diagnosis not present

## 2022-06-28 DIAGNOSIS — M48061 Spinal stenosis, lumbar region without neurogenic claudication: Secondary | ICD-10-CM | POA: Diagnosis not present

## 2022-06-28 DIAGNOSIS — M6389 Disorders of muscle in diseases classified elsewhere, multiple sites: Secondary | ICD-10-CM | POA: Diagnosis not present

## 2022-06-28 DIAGNOSIS — R278 Other lack of coordination: Secondary | ICD-10-CM | POA: Diagnosis not present

## 2022-07-08 DIAGNOSIS — M5136 Other intervertebral disc degeneration, lumbar region: Secondary | ICD-10-CM | POA: Diagnosis not present

## 2022-07-08 DIAGNOSIS — M5030 Other cervical disc degeneration, unspecified cervical region: Secondary | ICD-10-CM | POA: Diagnosis not present

## 2022-07-08 DIAGNOSIS — Z981 Arthrodesis status: Secondary | ICD-10-CM | POA: Diagnosis not present

## 2022-07-08 DIAGNOSIS — M160 Bilateral primary osteoarthritis of hip: Secondary | ICD-10-CM | POA: Diagnosis not present

## 2022-07-08 DIAGNOSIS — M5134 Other intervertebral disc degeneration, thoracic region: Secondary | ICD-10-CM | POA: Diagnosis not present

## 2022-07-08 DIAGNOSIS — Z4889 Encounter for other specified surgical aftercare: Secondary | ICD-10-CM | POA: Diagnosis not present

## 2022-07-10 ENCOUNTER — Other Ambulatory Visit: Payer: Self-pay | Admitting: Orthopedic Surgery

## 2022-07-10 DIAGNOSIS — I10 Essential (primary) hypertension: Secondary | ICD-10-CM

## 2022-07-19 DIAGNOSIS — E039 Hypothyroidism, unspecified: Secondary | ICD-10-CM | POA: Diagnosis not present

## 2022-07-19 DIAGNOSIS — I10 Essential (primary) hypertension: Secondary | ICD-10-CM | POA: Diagnosis not present

## 2022-07-19 DIAGNOSIS — F419 Anxiety disorder, unspecified: Secondary | ICD-10-CM | POA: Diagnosis not present

## 2022-07-19 DIAGNOSIS — R7989 Other specified abnormal findings of blood chemistry: Secondary | ICD-10-CM | POA: Diagnosis not present

## 2022-07-19 DIAGNOSIS — E785 Hyperlipidemia, unspecified: Secondary | ICD-10-CM | POA: Diagnosis not present

## 2022-07-19 DIAGNOSIS — E559 Vitamin D deficiency, unspecified: Secondary | ICD-10-CM | POA: Diagnosis not present

## 2022-07-26 DIAGNOSIS — Z0000 Encounter for general adult medical examination without abnormal findings: Secondary | ICD-10-CM | POA: Diagnosis not present

## 2022-07-26 DIAGNOSIS — Z1331 Encounter for screening for depression: Secondary | ICD-10-CM | POA: Diagnosis not present

## 2022-07-26 DIAGNOSIS — I700 Atherosclerosis of aorta: Secondary | ICD-10-CM | POA: Diagnosis not present

## 2022-07-26 DIAGNOSIS — K2970 Gastritis, unspecified, without bleeding: Secondary | ICD-10-CM | POA: Diagnosis not present

## 2022-07-26 DIAGNOSIS — Z1339 Encounter for screening examination for other mental health and behavioral disorders: Secondary | ICD-10-CM | POA: Diagnosis not present

## 2022-07-26 DIAGNOSIS — E559 Vitamin D deficiency, unspecified: Secondary | ICD-10-CM | POA: Diagnosis not present

## 2022-07-26 DIAGNOSIS — F419 Anxiety disorder, unspecified: Secondary | ICD-10-CM | POA: Diagnosis not present

## 2022-07-26 DIAGNOSIS — E785 Hyperlipidemia, unspecified: Secondary | ICD-10-CM | POA: Diagnosis not present

## 2022-07-26 DIAGNOSIS — M5136 Other intervertebral disc degeneration, lumbar region: Secondary | ICD-10-CM | POA: Diagnosis not present

## 2022-07-26 DIAGNOSIS — E039 Hypothyroidism, unspecified: Secondary | ICD-10-CM | POA: Diagnosis not present

## 2022-07-26 DIAGNOSIS — R82998 Other abnormal findings in urine: Secondary | ICD-10-CM | POA: Diagnosis not present

## 2022-08-09 DIAGNOSIS — Z23 Encounter for immunization: Secondary | ICD-10-CM | POA: Diagnosis not present

## 2022-08-13 ENCOUNTER — Other Ambulatory Visit: Payer: Self-pay | Admitting: Orthopedic Surgery

## 2022-08-13 DIAGNOSIS — I2694 Multiple subsegmental pulmonary emboli without acute cor pulmonale: Secondary | ICD-10-CM

## 2022-08-13 DIAGNOSIS — I82411 Acute embolism and thrombosis of right femoral vein: Secondary | ICD-10-CM

## 2022-08-16 DIAGNOSIS — D1801 Hemangioma of skin and subcutaneous tissue: Secondary | ICD-10-CM | POA: Diagnosis not present

## 2022-08-16 DIAGNOSIS — L570 Actinic keratosis: Secondary | ICD-10-CM | POA: Diagnosis not present

## 2022-08-16 DIAGNOSIS — L218 Other seborrheic dermatitis: Secondary | ICD-10-CM | POA: Diagnosis not present

## 2022-08-16 DIAGNOSIS — L821 Other seborrheic keratosis: Secondary | ICD-10-CM | POA: Diagnosis not present

## 2022-08-16 NOTE — Telephone Encounter (Signed)
We are not listed as this patients PCP. I will send to Windell Moulding, NP as she last say patient to advise on how to proceed with refill request

## 2022-08-27 DIAGNOSIS — Z90710 Acquired absence of both cervix and uterus: Secondary | ICD-10-CM | POA: Diagnosis not present

## 2022-08-27 DIAGNOSIS — Z86711 Personal history of pulmonary embolism: Secondary | ICD-10-CM | POA: Diagnosis not present

## 2022-08-27 DIAGNOSIS — Z09 Encounter for follow-up examination after completed treatment for conditions other than malignant neoplasm: Secondary | ICD-10-CM | POA: Diagnosis not present

## 2022-08-27 DIAGNOSIS — Z7952 Long term (current) use of systemic steroids: Secondary | ICD-10-CM | POA: Diagnosis not present

## 2022-08-27 DIAGNOSIS — Z87891 Personal history of nicotine dependence: Secondary | ICD-10-CM | POA: Diagnosis not present

## 2022-08-27 DIAGNOSIS — Z79899 Other long term (current) drug therapy: Secondary | ICD-10-CM | POA: Diagnosis not present

## 2022-08-27 DIAGNOSIS — Z86718 Personal history of other venous thrombosis and embolism: Secondary | ICD-10-CM | POA: Diagnosis not present

## 2022-08-31 ENCOUNTER — Other Ambulatory Visit: Payer: Self-pay | Admitting: Internal Medicine

## 2022-08-31 DIAGNOSIS — Z1231 Encounter for screening mammogram for malignant neoplasm of breast: Secondary | ICD-10-CM

## 2022-09-16 DIAGNOSIS — Z981 Arthrodesis status: Secondary | ICD-10-CM | POA: Diagnosis not present

## 2022-09-16 DIAGNOSIS — Z87891 Personal history of nicotine dependence: Secondary | ICD-10-CM | POA: Diagnosis not present

## 2022-09-16 DIAGNOSIS — M545 Low back pain, unspecified: Secondary | ICD-10-CM | POA: Diagnosis not present

## 2022-09-17 DIAGNOSIS — Z23 Encounter for immunization: Secondary | ICD-10-CM | POA: Diagnosis not present

## 2022-09-19 ENCOUNTER — Other Ambulatory Visit: Payer: Self-pay | Admitting: Orthopedic Surgery

## 2022-09-19 DIAGNOSIS — E039 Hypothyroidism, unspecified: Secondary | ICD-10-CM

## 2022-09-28 ENCOUNTER — Other Ambulatory Visit (HOSPITAL_BASED_OUTPATIENT_CLINIC_OR_DEPARTMENT_OTHER): Payer: Self-pay

## 2022-09-28 DIAGNOSIS — Z23 Encounter for immunization: Secondary | ICD-10-CM | POA: Diagnosis not present

## 2022-09-28 MED ORDER — FLUAD QUADRIVALENT 0.5 ML IM PRSY
PREFILLED_SYRINGE | INTRAMUSCULAR | 0 refills | Status: DC
Start: 2022-09-27 — End: 2023-02-04
  Filled 2022-09-28: qty 0.5, 1d supply, fill #0

## 2022-09-30 ENCOUNTER — Other Ambulatory Visit: Payer: Self-pay | Admitting: Orthopedic Surgery

## 2022-09-30 DIAGNOSIS — I10 Essential (primary) hypertension: Secondary | ICD-10-CM

## 2022-10-04 ENCOUNTER — Ambulatory Visit (HOSPITAL_BASED_OUTPATIENT_CLINIC_OR_DEPARTMENT_OTHER): Payer: Medicare Other | Attending: Orthopedic Surgery | Admitting: Physical Therapy

## 2022-10-04 ENCOUNTER — Encounter (HOSPITAL_BASED_OUTPATIENT_CLINIC_OR_DEPARTMENT_OTHER): Payer: Self-pay | Admitting: Physical Therapy

## 2022-10-04 ENCOUNTER — Other Ambulatory Visit: Payer: Self-pay

## 2022-10-04 DIAGNOSIS — R262 Difficulty in walking, not elsewhere classified: Secondary | ICD-10-CM | POA: Insufficient documentation

## 2022-10-04 DIAGNOSIS — Z981 Arthrodesis status: Secondary | ICD-10-CM | POA: Diagnosis not present

## 2022-10-04 DIAGNOSIS — G8929 Other chronic pain: Secondary | ICD-10-CM | POA: Diagnosis not present

## 2022-10-04 DIAGNOSIS — M6281 Muscle weakness (generalized): Secondary | ICD-10-CM | POA: Insufficient documentation

## 2022-10-04 DIAGNOSIS — M5459 Other low back pain: Secondary | ICD-10-CM | POA: Insufficient documentation

## 2022-10-04 NOTE — Therapy (Signed)
OUTPATIENT PHYSICAL THERAPY THORACOLUMBAR EVALUATION   Patient Name: Stephanie Baker MRN: [id number] DOB:1941-09-03, 81 y.o., female Today's Date: 10/04/2022   PT End of Session - 10/04/22 1019     Visit Number 1    Number of Visits 30    Date for PT Re-Evaluation 01/21/23    Authorization Type MCR    Progress Note Due on Visit 10    PT Start Time 1016    PT Stop Time 1058    PT Time Calculation (min) 42 min    Activity Tolerance Patient tolerated treatment well    Behavior During Therapy WFL for tasks assessed/performed             Past Medical History:  Diagnosis Date   Arthritis    OA   GERD (gastroesophageal reflux disease)    Hematuria    idiopathic- [facility]   Hyperlipemia    Hypertension    Hypothyroidism    Past Surgical History:  Procedure Laterality Date   ABDOMINAL HYSTERECTOMY     APPENDECTOMY     BACK SURGERY     lumbar   EYE SURGERY     bilateral cataract extraction with IOL   GASTROC RECESSION EXTREMITY Left 02/23/2018   Procedure: Left Gastroc Recession;  Surgeon: Wylene Simmer, MD;  Location: [place];  Service: Orthopedics;  Laterality: Left;   HAND SURGERY     left thumb MP   JOINT REPLACEMENT     right knee   PARTIAL KNEE ARTHROPLASTY  11/14/2012   Procedure: UNICOMPARTMENTAL KNEE;  Surgeon: Mauri Pole, MD;  Location: [facility];  Service: Orthopedics;  Laterality: Left;   TARSAL METATARSAL ARTHRODESIS Left 02/23/2018   Procedure: Left First and Second Tarsometatarsal Arthrodesis;  Surgeon: Wylene Simmer, MD;  Location: [place];  Service: Orthopedics;  Laterality: Left;   thumb surgery     left   VAGINAL HYSTERECTOMY     Patient Active Problem List   Diagnosis Date Noted   Multiple subsegmental pulmonary emboli without acute cor pulmonale ([place]) 06/08/2022   Acute deep vein thrombosis (DVT) of femoral vein of right lower extremity ([place]) 06/08/2022   Acquired hypothyroidism 06/08/2022    Slow transit constipation 06/08/2022   Spinal stenosis of thoracolumbar region 06/08/2022   Primary hypertension 06/08/2022   S/P left UKR 11/15/2012   Expected blood loss anemia 11/15/2012   Overweight (BMI 25.0-29.9) 11/15/2012     REFERRING PROVIDER: Burt Knack, PA-C  REFERRING DIAG:    Z98.1 (ICD-10-CM) - Arthrodesis status    Rationale for Evaluation and Treatment Rehabilitation  THERAPY DIAG:  Other low back pain  Muscle weakness (generalized)  Difficulty in walking, not elsewhere classified  ONSET DATE: approx early-mid 2022, surgery May 2023  SUBJECTIVE:  SUBJECTIVE STATEMENT: I can't walk. My husband passed away last 10-13-23- I was having difficulty walking and back pain before then but put it off. Apr 28, 2022 has back surgery. My back still aches and I cannot walk without a walker. Balance is okay but I am really careful. Less intense pain when I sit and increases with standing. Both legs feel abnormal , almost numb but can feel my feet when walking. I am in more pain since surgery. PT at [facility] after surgery. Noted more frequent urination but no loss of control.   PERTINENT HISTORY:   Bil partial TKA, h/o DVT  Diagnosis:  S/p T11-L4 reinstrumentation, T12-L1 through L2-3 SPO's [facility] 04/28/22) 2. Hx Trans psoas fusion at L3-4 followed by unilateral posterior instrumentation. Her imaging however shows that she has solid fusion of the L4-5 and L5-S1 levels. This could represent a congenital fusion but the overall appearance is more consistent with a previous fusion at those levels    PAIN:  Are you having pain? Yes: NPRS scale: 4 sitting, 5 standing/10 Pain location: across the whole back Pain description: constant with fluctuation in intensity Aggravating factors:  standing/walking Relieving factors: sit/lay   PRECAUTIONS: None  WEIGHT BEARING RESTRICTIONS No  FALLS:  Has patient fallen in last 6 months? No  LIVING ENVIRONMENT:  Lives in: Other [facility] Has following equipment at home: Environmental consultant - 2 wheeled and Wheelchair (power)  OCCUPATION: retired PT  PLOF: Independent  PATIENT GOALS walk independently, decrease pain   OBJECTIVE:   PATIENT SURVEYS:  FOTO [place]: Unable to fully extend either knee in seated  POSTURE: post pelvic tilt with decreased lordosis   LOWER EXTREMITY MMT:    MMT (age norm goal) Right eval Left eval  Hip flexion (21) 19.5lb 16.7  Hip extension    Hip abduction    Hip adduction    Hip internal rotation    Hip external rotation    Knee flexion 10.7 9.2  Knee extension (46) 20.7 23.3  Ankle dorsiflexion    Ankle plantarflexion    Ankle inversion    Ankle eversion     (Blank rows = not tested)   FUNCTIONAL TESTS:  5 times sit to stand: 14s  GAIT: Distance walked: to clinic and to<>from pool Assistive device utilized: Environmental consultant - 2 wheeled Level of assistance: Modified independence Comments: slow cadence, flat foot strike, flexed posture    TODAY'S TREATMENT  Treatment                            10/04/22:  Seated HSS Seated piriformis stretch Seated posture and abdominal engagement- also in standing   PATIENT EDUCATION:  Education details: Anatomy of condition, POC, HEP, exercise form/rationale, aquatic rehab Person educated: Patient Education method: Explanation, Demonstration, Tactile cues, Verbal cues, and Handouts Education comprehension: verbalized understanding, returned demonstration, verbal cues required, tactile cues required, and needs further education   HOME EXERCISE PROGRAM: [id number]  ASSESSMENT:  CLINICAL IMPRESSION: Patient is a 81 y.o. F who was seen today for physical therapy evaluation and treatment for chronic LBP. Gluts present good power in  sit<>stand but notable tightness and weakness in hamstrings. Post pelvic tilt with decreased lumbar lordosis creating abnormal pressure and spasm through lumbar musculature.     OBJECTIVE IMPAIRMENTS Abnormal gait, decreased activity tolerance, decreased strength, increased muscle spasms, impaired flexibility, impaired sensation, improper body mechanics, postural dysfunction, and pain.   ACTIVITY LIMITATIONS standing, squatting, stairs, transfers,  and locomotion level  PARTICIPATION LIMITATIONS: meal prep, cleaning, laundry, shopping, and community activity  PERSONAL FACTORS Past/current experiences and Time since onset of injury/illness/exacerbation are also affecting patient's functional outcome.   REHAB POTENTIAL: Good  CLINICAL DECISION MAKING: Stable/uncomplicated  EVALUATION COMPLEXITY: Low   GOALS: Goals reviewed with patient? Yes  SHORT TERM GOALS: Target date: 11/05/2022  Pt will find an appropriate level of intensity in aquatic exercise for strengthening Baseline:has not started yet Goal status: INITIAL  2.  Pt will verbalize awareness of postural alignment in seated and standing through ADLs Baseline: began educating at eval Goal status: INITIAL   LONG TERM GOALS: Target date: 01/21/23  Pt will meet stated MMT goals in chart Baseline: see chart Goal status: INITIAL  2.  Pt will be able to ambulate household distances without AD and without increase in pain Baseline: relies on walker at eval Goal status: INITIAL  3.  Average ain <=3/10 with ADLs Baseline: severe and limiting at eval Goal status: INITIAL  4.  Will tolerate community ambulation with AD less than walker Baseline: will progress as appropriate Goal status: INITIAL  PLAN: PT FREQUENCY: 2x/week  PT DURATION: other: 15 weeks  PLANNED INTERVENTIONS: Therapeutic exercises, Therapeutic activity, Neuromuscular re-education, Balance training, Gait training, Patient/Family education, Self Care, Joint  mobilization, Stair training, Aquatic Therapy, Dry Needling, Electrical stimulation, Spinal mobilization, Cryotherapy, Moist heat, Taping, Manual therapy, and Re-evaluation.  PLAN FOR NEXT SESSION: begin aquatics, core, Low back STM, hamstring stretching  Kwesi Sangha C. Taylinn Brabant PT, DPT 10/04/22 11:16 AM

## 2022-10-13 ENCOUNTER — Ambulatory Visit
Admission: RE | Admit: 2022-10-13 | Discharge: 2022-10-13 | Disposition: A | Discharge status: Home / Self Care | Payer: Medicare Other | Source: Ambulatory Visit | Attending: Internal Medicine | Admitting: Internal Medicine

## 2022-10-13 DIAGNOSIS — Z1231 Encounter for screening mammogram for malignant neoplasm of breast: Secondary | ICD-10-CM | POA: Diagnosis not present

## 2022-10-28 ENCOUNTER — Encounter (HOSPITAL_BASED_OUTPATIENT_CLINIC_OR_DEPARTMENT_OTHER): Payer: Self-pay | Admitting: Physical Therapy

## 2022-10-28 ENCOUNTER — Ambulatory Visit (HOSPITAL_BASED_OUTPATIENT_CLINIC_OR_DEPARTMENT_OTHER): Payer: Medicare Other | Attending: Orthopedic Surgery | Admitting: Physical Therapy

## 2022-10-28 DIAGNOSIS — R262 Difficulty in walking, not elsewhere classified: Secondary | ICD-10-CM | POA: Diagnosis not present

## 2022-10-28 DIAGNOSIS — M6281 Muscle weakness (generalized): Secondary | ICD-10-CM | POA: Diagnosis not present

## 2022-10-28 DIAGNOSIS — M5459 Other low back pain: Secondary | ICD-10-CM | POA: Diagnosis not present

## 2022-10-28 NOTE — Therapy (Signed)
OUTPATIENT PHYSICAL THERAPY THORACOLUMBAR EVALUATION   Patient Name: Stephanie Baker MRN: [id number] DOB:Feb 01, 1941, 81 y.o., female Today's Date: 10/28/2022   PT End of Session - 10/28/22 1832     Visit Number 2    Number of Visits 30    Date for PT Re-Evaluation 01/21/23    Authorization Type MCR    Progress Note Due on Visit 10    PT Start Time 1616    PT Stop Time 1700    PT Time Calculation (min) 44 min    Activity Tolerance Patient tolerated treatment well    Behavior During Therapy WFL for tasks assessed/performed              Past Medical History:  Diagnosis Date   Arthritis    OA   GERD (gastroesophageal reflux disease)    Hematuria    idiopathic- [facility]   Hyperlipemia    Hypertension    Hypothyroidism    Past Surgical History:  Procedure Laterality Date   ABDOMINAL HYSTERECTOMY     APPENDECTOMY     BACK SURGERY     lumbar   EYE SURGERY     bilateral cataract extraction with IOL   GASTROC RECESSION EXTREMITY Left 02/23/2018   Procedure: Left Gastroc Recession;  Surgeon: Wylene Simmer, MD;  Location: [place];  Service: Orthopedics;  Laterality: Left;   HAND SURGERY     left thumb MP   JOINT REPLACEMENT     right knee   PARTIAL KNEE ARTHROPLASTY  11/14/2012   Procedure: UNICOMPARTMENTAL KNEE;  Surgeon: Mauri Pole, MD;  Location: [facility];  Service: Orthopedics;  Laterality: Left;   TARSAL METATARSAL ARTHRODESIS Left 02/23/2018   Procedure: Left First and Second Tarsometatarsal Arthrodesis;  Surgeon: Wylene Simmer, MD;  Location: [place];  Service: Orthopedics;  Laterality: Left;   thumb surgery     left   VAGINAL HYSTERECTOMY     Patient Active Problem List   Diagnosis Date Noted   Multiple subsegmental pulmonary emboli without acute cor pulmonale ([place]) 06/08/2022   Acute deep vein thrombosis (DVT) of femoral vein of right lower extremity ([place]) 06/08/2022   Acquired hypothyroidism 06/08/2022    Slow transit constipation 06/08/2022   Spinal stenosis of thoracolumbar region 06/08/2022   Primary hypertension 06/08/2022   S/P left UKR 11/15/2012   Expected blood loss anemia 11/15/2012   Overweight (BMI 25.0-29.9) 11/15/2012     REFERRING PROVIDER: Burt Knack, PA-C  REFERRING DIAG:    Z98.1 (ICD-10-CM) - Arthrodesis status    Rationale for Evaluation and Treatment Rehabilitation  THERAPY DIAG:  Other low back pain  Muscle weakness (generalized)  Difficulty in walking, not elsewhere classified  ONSET DATE: approx early-mid 2022, surgery May 2023  SUBJECTIVE:  SUBJECTIVE STATEMENT: Its my first time here.  Pain in lb and some tingling in my legs   I can't walk. My husband passed away last 10-13-2023- I was having difficulty walking and back pain before then but put it off. Apr 28, 2022 has back surgery. My back still aches and I cannot walk without a walker. Balance is okay but I am really careful. Less intense pain when I sit and increases with standing. Both legs feel abnormal , almost numb but can feel my feet when walking. I am in more pain since surgery. PT at [facility] after surgery. Noted more frequent urination but no loss of control.   PERTINENT HISTORY:   Bil partial TKA, h/o DVT  Diagnosis:  S/p T11-L4 reinstrumentation, T12-L1 through L2-3 SPO's [facility] 04/28/22) 2. Hx Trans psoas fusion at L3-4 followed by unilateral posterior instrumentation. Her imaging however shows that she has solid fusion of the L4-5 and L5-S1 levels. This could represent a congenital fusion but the overall appearance is more consistent with a previous fusion at those levels    PAIN:  Are you having pain? Yes: NPRS scale: 4 sitting, 5 standing/10 Pain location: across the whole back Pain description:  constant with fluctuation in intensity Aggravating factors: standing/walking Relieving factors: sit/lay   PRECAUTIONS: None  WEIGHT BEARING RESTRICTIONS No  FALLS:  Has patient fallen in last 6 months? No  LIVING ENVIRONMENT:  Lives in: Other [facility] Has following equipment at home: Environmental consultant - 2 wheeled and Wheelchair (power)  OCCUPATION: retired PT  PLOF: Independent  PATIENT GOALS walk independently, decrease pain   OBJECTIVE:   PATIENT SURVEYS:  FOTO [place]: Unable to fully extend either knee in seated  POSTURE: post pelvic tilt with decreased lordosis   LOWER EXTREMITY MMT:    MMT (age norm goal) Right eval Left eval  Hip flexion (21) 19.5lb 16.7  Hip extension    Hip abduction    Hip adduction    Hip internal rotation    Hip external rotation    Knee flexion 10.7 9.2  Knee extension (46) 20.7 23.3  Ankle dorsiflexion    Ankle plantarflexion    Ankle inversion    Ankle eversion     (Blank rows = not tested)   FUNCTIONAL TESTS:  5 times sit to stand: 14s  GAIT: Distance walked: to clinic and to<>from pool Assistive device utilized: Environmental consultant - 2 wheeled Level of assistance: Modified independence Comments: slow cadence, flat foot strike, flexed posture    TODAY'S TREATMENT  Treatment                            10/27/22:  Pt seen for aquatic therapy today.  Treatment took place in water 3.25-4.5 ft in depth at the [facility]. Temp of water was 91.  Pt entered/exited the pool via stairs using step to pattern with hand  rail.  Intro to setting CGA and instruction on  stair climbing entering pool using step to pattern holding ot bilateral handrails Walking using white barbell forward and backward multiple widths [id number]. Side stepping with cga x 1 width Seated on bench: cycling; add/abd 2x20; LAQ x10  -Gastroc and hamstring stretching  -STS ue support white barbell bench onto water step x 10 min assist to cga. Cues  for weight shift and pacing for control. Standing ue supported on wall: df; pf; hip abd; hip extension; squats x 10 rep CGA with exiting  pool cues for step to and taking rest 1/2 way up.    Pt requires the buoyancy and hydrostatic pressure of water for support, and to offload joints by unweighting joint load by at least 50 % in navel deep water and by at least 75-80% in chest to neck deep water.  Viscosity of the water is needed for resistance of strengthening. Water current perturbations provides challenge to standing balance requiring increased core activation.      PATIENT EDUCATION:  Education details: Geophysicist/field seismologist of condition, POC, HEP, exercise form/rationale, aquatic rehab Person educated: Patient Education method: Explanation, Demonstration, Tactile cues, Verbal cues, and Handouts Education comprehension: verbalized understanding, returned demonstration, verbal cues required, tactile cues required, and needs further education   HOME EXERCISE PROGRAM: [email or web address]  ASSESSMENT:  CLINICAL IMPRESSION: Pt comfortable in setting. She does require assistance of therapist in pool for now for safety although I do anticipate decreasing need going forward as pt becomes acclimated and more confident. She reports 5/10 pain initially with about 1 point reduction upon completion.  Hip extension increased tingling in le's and some pain in small of back. Adjusted positioning and decreased range eliminates added sx. She is given demonstration as well as vc for execution of exercises and some manual assist initially.  She is a good candidate for aquatic therapy and will benefit from the properties of water to facilitate and hasten progression towards goals.    Initial clinical impression: Patient is a 81 y.o. F who was seen today for physical therapy evaluation and treatment for chronic LBP. Gluts present good power in sit<>stand but notable tightness and weakness in hamstrings. Post pelvic tilt with  decreased lumbar lordosis creating abnormal pressure and spasm through lumbar musculature.     OBJECTIVE IMPAIRMENTS Abnormal gait, decreased activity tolerance, decreased strength, increased muscle spasms, impaired flexibility, impaired sensation, improper body mechanics, postural dysfunction, and pain.   ACTIVITY LIMITATIONS standing, squatting, stairs, transfers, and locomotion level  PARTICIPATION LIMITATIONS: meal prep, cleaning, laundry, shopping, and community activity  PERSONAL FACTORS Past/current experiences and Time since onset of injury/illness/exacerbation are also affecting patient's functional outcome.   REHAB POTENTIAL: Good  CLINICAL DECISION MAKING: Stable/uncomplicated  EVALUATION COMPLEXITY: Low   GOALS: Goals reviewed with patient? Yes  SHORT TERM GOALS: Target date: 11/05/2022  Pt will find an appropriate level of intensity in aquatic exercise for strengthening Baseline:has not started yet Goal status: INITIAL  2.  Pt will verbalize awareness of postural alignment in seated and standing through ADLs Baseline: began educating at eval Goal status: INITIAL   LONG TERM GOALS: Target date: 01/21/23  Pt will meet stated MMT goals in chart Baseline: see chart Goal status: INITIAL  2.  Pt will be able to ambulate household distances without AD and without increase in pain Baseline: relies on walker at eval Goal status: INITIAL  3.  Average ain <=3/10 with ADLs Baseline: severe and limiting at eval Goal status: INITIAL  4.  Will tolerate community ambulation with AD less than walker Baseline: will progress as appropriate Goal status: INITIAL  PLAN: PT FREQUENCY: 2x/week  PT DURATION: other: 15 weeks  PLANNED INTERVENTIONS: Therapeutic exercises, Therapeutic activity, Neuromuscular re-education, Balance training, Gait training, Patient/Family education, Self Care, Joint mobilization, Stair training, Aquatic Therapy, Dry Needling, Electrical  stimulation, Spinal mobilization, Cryotherapy, Moist heat, Taping, Manual therapy, and Re-evaluation.  PLAN FOR NEXT SESSION: begin aquatics, core, Low back STM, hamstring stretching  Annamarie Major) Rynell Ciotti MPT 10/28/22 6:34 PM

## 2022-11-02 ENCOUNTER — Ambulatory Visit (HOSPITAL_BASED_OUTPATIENT_CLINIC_OR_DEPARTMENT_OTHER): Payer: Medicare Other | Admitting: Physical Therapy

## 2022-11-02 ENCOUNTER — Encounter (HOSPITAL_BASED_OUTPATIENT_CLINIC_OR_DEPARTMENT_OTHER): Payer: Self-pay | Admitting: Physical Therapy

## 2022-11-02 DIAGNOSIS — R262 Difficulty in walking, not elsewhere classified: Secondary | ICD-10-CM

## 2022-11-02 DIAGNOSIS — M6281 Muscle weakness (generalized): Secondary | ICD-10-CM | POA: Diagnosis not present

## 2022-11-02 DIAGNOSIS — M5459 Other low back pain: Secondary | ICD-10-CM

## 2022-11-02 NOTE — Therapy (Signed)
OUTPATIENT PHYSICAL THERAPY THORACOLUMBAR TREATMENT   Patient Name: Stephanie Baker MRN: [id number] DOB:12/29/1940, 81 y.o., female Today's Date: 11/02/2022   PT End of Session - 11/02/22 1402     Visit Number 3    Number of Visits 30    Date for PT Re-Evaluation 01/21/23    Authorization Type MCR    Progress Note Due on Visit 10    PT Start Time 2707    PT Stop Time 1440    PT Time Calculation (min) 42 min    Behavior During Therapy WFL for tasks assessed/performed              Past Medical History:  Diagnosis Date   Arthritis    OA   GERD (gastroesophageal reflux disease)    Hematuria    idiopathic- Dr. [facility]   Hyperlipemia    Hypertension    Hypothyroidism    Past Surgical History:  Procedure Laterality Date   ABDOMINAL HYSTERECTOMY     APPENDECTOMY     BACK SURGERY     lumbar   EYE SURGERY     bilateral cataract extraction with IOL   GASTROC RECESSION EXTREMITY Left 02/23/2018   Procedure: Left Gastroc Recession;  Surgeon: Wylene Simmer, MD;  Location: [place];  Service: Orthopedics;  Laterality: Left;   HAND SURGERY     left thumb MP   JOINT REPLACEMENT     right knee   PARTIAL KNEE ARTHROPLASTY  11/14/2012   Procedure: UNICOMPARTMENTAL KNEE;  Surgeon: Mauri Pole, MD;  Location: [facility];  Service: Orthopedics;  Laterality: Left;   TARSAL METATARSAL ARTHRODESIS Left 02/23/2018   Procedure: Left First and Second Tarsometatarsal Arthrodesis;  Surgeon: Wylene Simmer, MD;  Location: [place];  Service: Orthopedics;  Laterality: Left;   thumb surgery     left   VAGINAL HYSTERECTOMY     Patient Active Problem List   Diagnosis Date Noted   Multiple subsegmental pulmonary emboli without acute cor pulmonale ([place]) 06/08/2022   Acute deep vein thrombosis (DVT) of femoral vein of right lower extremity ([place]) 06/08/2022   Acquired hypothyroidism 06/08/2022   Slow transit constipation 06/08/2022   Spinal stenosis  of thoracolumbar region 06/08/2022   Primary hypertension 06/08/2022   S/P left UKR 11/15/2012   Expected blood loss anemia 11/15/2012   Overweight (BMI 25.0-29.9) 11/15/2012     REFERRING PROVIDER: Burt Knack, PA-C  REFERRING DIAG:    Z98.1 (ICD-10-CM) - Arthrodesis status    Rationale for Evaluation and Treatment Rehabilitation  THERAPY DIAG:  Other low back pain  Muscle weakness (generalized)  Difficulty in walking, not elsewhere classified  ONSET DATE: approx early-mid 2022, surgery May 2023  SUBJECTIVE:  SUBJECTIVE STATEMENT: "I was sore after last session".    From EVAL: I can't walk. My husband passed away last 10/18/2023- I was having difficulty walking and back pain before then but put it off. Apr 28, 2022 has back surgery. My back still aches and I cannot walk without a walker. Balance is okay but I am really careful. Less intense pain when I sit and increases with standing. Both legs feel abnormal , almost numb but can feel my feet when walking. I am in more pain since surgery. PT at [facility] after surgery. Noted more frequent urination but no loss of control.   PERTINENT HISTORY:   Bil partial TKA, h/o DVT  Diagnosis:  S/p T11-L4 reinstrumentation, T12-L1 through L2-3 SPO's [facility] 04/28/22) 2. Hx Trans psoas fusion at L3-4 followed by unilateral posterior instrumentation. Her imaging however shows that she has solid fusion of the L4-5 and L5-S1 levels. This could represent a congenital fusion but the overall appearance is more consistent with a previous fusion at those levels    PAIN:  Are you having pain? Yes: NPRS scale:6/10 Pain location: across the whole back Pain description: constant with fluctuation in intensity Aggravating factors: standing/walking Relieving factors:  sit/lay   PRECAUTIONS: None  WEIGHT BEARING RESTRICTIONS No  FALLS:  Has patient fallen in last 6 months? No  LIVING ENVIRONMENT:  Lives in: Other [facility] Has following equipment at home: Environmental consultant - 2 wheeled and Wheelchair (power)  OCCUPATION: retired PT  PLOF: Independent  PATIENT GOALS walk independently, decrease pain   OBJECTIVE:   PATIENT SURVEYS:  FOTO [place]: Unable to fully extend either knee in seated  POSTURE: post pelvic tilt with decreased lordosis   LOWER EXTREMITY MMT:    MMT (age norm goal) Right eval Left eval  Hip flexion (21) 19.5lb 16.7  Hip extension    Hip abduction    Hip adduction    Hip internal rotation    Hip external rotation    Knee flexion 10.7 9.2  Knee extension (46) 20.7 23.3  Ankle dorsiflexion    Ankle plantarflexion    Ankle inversion    Ankle eversion     (Blank rows = not tested)   FUNCTIONAL TESTS:  5 times sit to stand: 14s  GAIT: Distance walked: to clinic and to<>from pool Assistive device utilized: Environmental consultant - 2 wheeled Level of assistance: Modified independence Comments: slow cadence, flat foot strike, flexed posture    TODAY'S TREATMENT  Treatment                            10/27/22:  Pt seen for aquatic therapy today.  Treatment took place in water 3.25-4.5 ft in depth at the [facility]. Temp of water was 92.  Pt entered/exited the pool via stairs using step to pattern, with supervision, with hand  rail.  * Holding white barbell:  walking forward x 2 laps, side stepping R/L x 2 laps * Holding wall:  heel raises (4 ft) x 10; squats x 10 (at 48f6") x 10; hip abdct/addct x 10;  hip ext (small range) x 10; hip circles x 10 (outward); Hip flexor stretch x 10s x 2; side to side lunge; hip openers * STS from bench with feet on blue step and hands on white barbell x 10 * alternating LAQ  * return to walking forward/backwards; side stepping with white barbell  * hamstring stretch  with foot on 2nd  step; fig 4 (very tight)   Pt requires the buoyancy and hydrostatic pressure of water for support, and to offload joints by unweighting joint load by at least 50 % in navel deep water and by at least 75-80% in chest to neck deep water.  Viscosity of the water is needed for resistance of strengthening. Water current perturbations provides challenge to standing balance requiring increased core activation.      PATIENT EDUCATION:  Education details: aquatic progressions and modifications Person educated: Patient Education method: Consulting civil engineer, Demonstration, Tactile cues, Verbal cues, and Handouts Education comprehension: verbalized understanding, returned demonstration, verbal cues required, tactile cues required, and needs further education   HOME EXERCISE PROGRAM: [id number]  ASSESSMENT:  CLINICAL IMPRESSION: Pt presents with tight ant hips bilat (R>L); limited hip ext with hip flexor stretch in standing. Rt hamstring tight; not able to get knee to full extension.  She reports relief of back pain when in squat position submerged.  Able to complete exercises with confidence in setting receiving instruction / cues from therapist on deck.  Pain slightly less by end of session.  Pt will benefit from the properties of water to facilitate and hasten progression towards goals.    OBJECTIVE IMPAIRMENTS Abnormal gait, decreased activity tolerance, decreased strength, increased muscle spasms, impaired flexibility, impaired sensation, improper body mechanics, postural dysfunction, and pain.   ACTIVITY LIMITATIONS standing, squatting, stairs, transfers, and locomotion level  PARTICIPATION LIMITATIONS: meal prep, cleaning, laundry, shopping, and community activity  PERSONAL FACTORS Past/current experiences and Time since onset of injury/illness/exacerbation are also affecting patient's functional outcome.   REHAB POTENTIAL: Good  CLINICAL DECISION MAKING:  Stable/uncomplicated  EVALUATION COMPLEXITY: Low   GOALS: Goals reviewed with patient? Yes  SHORT TERM GOALS: Target date: 11/05/2022  Pt will find an appropriate level of intensity in aquatic exercise for strengthening Baseline:has not started yet Goal status: INITIAL  2.  Pt will verbalize awareness of postural alignment in seated and standing through ADLs Baseline: began educating at eval Goal status: INITIAL   LONG TERM GOALS: Target date: 01/21/23  Pt will meet stated MMT goals in chart Baseline: see chart Goal status: INITIAL  2.  Pt will be able to ambulate household distances without AD and without increase in pain Baseline: relies on walker at eval Goal status: INITIAL  3.  Average ain <=3/10 with ADLs Baseline: severe and limiting at eval Goal status: INITIAL  4.  Will tolerate community ambulation with AD less than walker Baseline: will progress as appropriate Goal status: INITIAL  PLAN: PT FREQUENCY: 2x/week  PT DURATION: other: 15 weeks  PLANNED INTERVENTIONS: Therapeutic exercises, Therapeutic activity, Neuromuscular re-education, Balance training, Gait training, Patient/Family education, Self Care, Joint mobilization, Stair training, Aquatic Therapy, Dry Needling, Electrical stimulation, Spinal mobilization, Cryotherapy, Moist heat, Taping, Manual therapy, and Re-evaluation.  PLAN FOR NEXT SESSION: continue aquatics, core, Low back STM, hamstring stretching  Kerin Perna, PTA 11/02/22 6:13 PM [facility] [address] [place], [place], [address] Phone: [phone]   Fax:  [phone]

## 2022-11-04 ENCOUNTER — Ambulatory Visit (HOSPITAL_BASED_OUTPATIENT_CLINIC_OR_DEPARTMENT_OTHER): Payer: Medicare Other | Admitting: Physical Therapy

## 2022-11-04 ENCOUNTER — Encounter (HOSPITAL_BASED_OUTPATIENT_CLINIC_OR_DEPARTMENT_OTHER): Payer: Self-pay | Admitting: Physical Therapy

## 2022-11-04 DIAGNOSIS — R262 Difficulty in walking, not elsewhere classified: Secondary | ICD-10-CM | POA: Diagnosis not present

## 2022-11-04 DIAGNOSIS — M6281 Muscle weakness (generalized): Secondary | ICD-10-CM

## 2022-11-04 DIAGNOSIS — M5459 Other low back pain: Secondary | ICD-10-CM

## 2022-11-04 NOTE — Therapy (Signed)
OUTPATIENT PHYSICAL THERAPY THORACOLUMBAR TREATMENT   Patient Name: Stephanie Baker MRN: [id number] DOB:[birth year], 81 y.o., female Today's Date: 11/02/2022   PT End of Session - 11/02/22 1402     Visit Number 3    Number of Visits 30    Date for PT Re-Evaluation 01/21/23    Authorization Type MCR    Progress Note Due on Visit 10    PT Start Time 0881    PT Stop Time 1440    PT Time Calculation (min) 42 min    Behavior During Therapy WFL for tasks assessed/performed              Past Medical History:  Diagnosis Date   Arthritis    OA   GERD (gastroesophageal reflux disease)    Hematuria    idiopathic- Dr. [facility]   Hyperlipemia    Hypertension    Hypothyroidism    Past Surgical History:  Procedure Laterality Date   ABDOMINAL HYSTERECTOMY     APPENDECTOMY     BACK SURGERY     lumbar   EYE SURGERY     bilateral cataract extraction with IOL   GASTROC RECESSION EXTREMITY Left 02/23/2018   Procedure: Left Gastroc Recession;  Surgeon: Wylene Simmer, MD;  Location: [place];  Service: Orthopedics;  Laterality: Left;   HAND SURGERY     left thumb MP   JOINT REPLACEMENT     right knee   PARTIAL KNEE ARTHROPLASTY  11/14/2012   Procedure: UNICOMPARTMENTAL KNEE;  Surgeon: Mauri Pole, MD;  Location: [facility];  Service: Orthopedics;  Laterality: Left;   TARSAL METATARSAL ARTHRODESIS Left 02/23/2018   Procedure: Left First and Second Tarsometatarsal Arthrodesis;  Surgeon: Wylene Simmer, MD;  Location: [place];  Service: Orthopedics;  Laterality: Left;   thumb surgery     left   VAGINAL HYSTERECTOMY     Patient Active Problem List   Diagnosis Date Noted   Multiple subsegmental pulmonary emboli without acute cor pulmonale ([place]) 06/08/2022   Acute deep vein thrombosis (DVT) of femoral vein of right lower extremity ([place]) 06/08/2022   Acquired hypothyroidism 06/08/2022   Slow transit constipation 06/08/2022   Spinal stenosis  of thoracolumbar region 06/08/2022   Primary hypertension 06/08/2022   S/P left UKR 11/15/2012   Expected blood loss anemia 11/15/2012   Overweight (BMI 25.0-29.9) 11/15/2012     REFERRING PROVIDER: Burt Knack, PA-C  REFERRING DIAG:    Z98.1 (ICD-10-CM) - Arthrodesis status    Rationale for Evaluation and Treatment Rehabilitation  THERAPY DIAG:  Other low back pain  Muscle weakness (generalized)  Difficulty in walking, not elsewhere classified  ONSET DATE: approx early-mid 2022, surgery May 2023  SUBJECTIVE:  SUBJECTIVE STATEMENT: "I have not gotten much relief in pain yet but I am hopeful".    From EVAL: I can't walk. My husband passed away last October 13, 2023- I was having difficulty walking and back pain before then but put it off. Apr 28, 2022 has back surgery. My back still aches and I cannot walk without a walker. Balance is okay but I am really careful. Less intense pain when I sit and increases with standing. Both legs feel abnormal , almost numb but can feel my feet when walking. I am in more pain since surgery. PT at [facility] after surgery. Noted more frequent urination but no loss of control.   PERTINENT HISTORY:   Bil partial TKA, h/o DVT  Diagnosis:  S/p T11-L4 reinstrumentation, T12-L1 through L2-3 SPO's [facility] 04/28/22) 2. Hx Trans psoas fusion at L3-4 followed by unilateral posterior instrumentation. Her imaging however shows that she has solid fusion of the L4-5 and L5-S1 levels. This could represent a congenital fusion but the overall appearance is more consistent with a previous fusion at those levels    PAIN:  Are you having pain? Yes: NPRS scale:6/10 Pain location: across the whole back Pain description: constant with fluctuation in intensity Aggravating factors:  standing/walking Relieving factors: sit/lay   PRECAUTIONS: None  WEIGHT BEARING RESTRICTIONS No  FALLS:  Has patient fallen in last 6 months? No  LIVING ENVIRONMENT:  Lives in: Other [facility] Has following equipment at home: Environmental consultant - 2 wheeled and Wheelchair (power)  OCCUPATION: retired PT  PLOF: Independent  PATIENT GOALS walk independently, decrease pain   OBJECTIVE:   PATIENT SURVEYS:  FOTO [place]: Unable to fully extend either knee in seated  POSTURE: post pelvic tilt with decreased lordosis   LOWER EXTREMITY MMT:    MMT (age norm goal) Right eval Left eval  Hip flexion (21) 19.5lb 16.7  Hip extension    Hip abduction    Hip adduction    Hip internal rotation    Hip external rotation    Knee flexion 10.7 9.2  Knee extension (46) 20.7 23.3  Ankle dorsiflexion    Ankle plantarflexion    Ankle inversion    Ankle eversion     (Blank rows = not tested)   FUNCTIONAL TESTS:  5 times sit to stand: 14s  GAIT: Distance walked: to clinic and to<>from pool Assistive device utilized: Environmental consultant - 2 wheeled Level of assistance: Modified independence Comments: slow cadence, flat foot strike, flexed posture    TODAY'S TREATMENT  Treatment                            11/04/22:  Pt seen for aquatic therapy today.  Treatment took place in water 3.25-4.5 ft in depth at the [facility]. Temp of water was 92.  Pt entered/exited the pool via stairs using step to pattern, with supervision, with hand  rail.  * Holding black barbell:  walking forward,back and side stepping * Holding wall:  heel raises (4 ft) x 10; squats x 10 (at 77f6") x 10; hip abdct/addct x 10;  hip ext (small range) x 10; hip circles x 10 (outward); Hip flexor stretch x 10s x 2; side to side lunge; hip openers * hamstring stretch with foot on 2nd step; fig 4 (very tight) * STS from bench with feet on blue step and hands on black barbell x 10 * alternating LAQ   side  stepping with black  barbell x 4 widths * walking forward/backwards in between exercises for recovery    Pt requires the buoyancy and hydrostatic pressure of water for support, and to offload joints by unweighting joint load by at least 50 % in navel deep water and by at least 75-80% in chest to neck deep water.  Viscosity of the water is needed for resistance of strengthening. Water current perturbations provides challenge to standing balance requiring increased core activation.      PATIENT EDUCATION:  Education details: aquatic progressions and modifications Person educated: Patient Education method: Consulting civil engineer, Demonstration, Tactile cues, Verbal cues, and Handouts Education comprehension: verbalized understanding, returned demonstration, verbal cues required, tactile cues required, and needs further education   HOME EXERCISE PROGRAM: [id number]  ASSESSMENT:  CLINICAL IMPRESSION Pt reports she continues to have constant pain with maybe a slight reduction after last 2 aquatic sessions for a short while. She pauses frequently in todays session to rub bilaterally along lb and hip area. She reports increased pain with cycling. She pushes herself through session well although with discomfort in back. I do believe the combination of walking long distance from unit at [facility] to car then long distance form parking lot to pool and back is exacerbating her pain.  She is willing to have [facility] transport, if available , to and from pool to decrease exertional level. Goals ongoing    OBJECTIVE IMPAIRMENTS Abnormal gait, decreased activity tolerance, decreased strength, increased muscle spasms, impaired flexibility, impaired sensation, improper body mechanics, postural dysfunction, and pain.   ACTIVITY LIMITATIONS standing, squatting, stairs, transfers, and locomotion level  PARTICIPATION LIMITATIONS: meal prep, cleaning, laundry, shopping, and community activity  PERSONAL FACTORS Past/current  experiences and Time since onset of injury/illness/exacerbation are also affecting patient's functional outcome.   REHAB POTENTIAL: Good  CLINICAL DECISION MAKING: Stable/uncomplicated  EVALUATION COMPLEXITY: Low   GOALS: Goals reviewed with patient? Yes  SHORT TERM GOALS: Target date: 11/05/2022  Pt will find an appropriate level of intensity in aquatic exercise for strengthening Baseline:has not started yet Goal status: INITIAL  2.  Pt will verbalize awareness of postural alignment in seated and standing through ADLs Baseline: began educating at eval Goal status: INITIAL   LONG TERM GOALS: Target date: 01/21/23  Pt will meet stated MMT goals in chart Baseline: see chart Goal status: INITIAL  2.  Pt will be able to ambulate household distances without AD and without increase in pain Baseline: relies on walker at eval Goal status: INITIAL  3.  Average ain <=3/10 with ADLs Baseline: severe and limiting at eval Goal status: INITIAL  4.  Will tolerate community ambulation with AD less than walker Baseline: will progress as appropriate Goal status: INITIAL  PLAN: PT FREQUENCY: 2x/week  PT DURATION: other: 15 weeks  PLANNED INTERVENTIONS: Therapeutic exercises, Therapeutic activity, Neuromuscular re-education, Balance training, Gait training, Patient/Family education, Self Care, Joint mobilization, Stair training, Aquatic Therapy, Dry Needling, Electrical stimulation, Spinal mobilization, Cryotherapy, Moist heat, Taping, Manual therapy, and Re-evaluation.  PLAN FOR NEXT SESSION: continue aquatics, core, Low back STM, hamstring stretching  Annamarie Major) Muskan Bolla MPT 11/02/22 6:13 PM [facility] [address] [place], [place], [address] Phone: [phone]   Fax:  [phone]

## 2022-11-09 ENCOUNTER — Encounter (HOSPITAL_BASED_OUTPATIENT_CLINIC_OR_DEPARTMENT_OTHER): Payer: Self-pay | Admitting: Physical Therapy

## 2022-11-09 ENCOUNTER — Ambulatory Visit (HOSPITAL_BASED_OUTPATIENT_CLINIC_OR_DEPARTMENT_OTHER): Payer: Medicare Other | Admitting: Physical Therapy

## 2022-11-09 DIAGNOSIS — M5459 Other low back pain: Secondary | ICD-10-CM

## 2022-11-09 DIAGNOSIS — R262 Difficulty in walking, not elsewhere classified: Secondary | ICD-10-CM

## 2022-11-09 DIAGNOSIS — M6281 Muscle weakness (generalized): Secondary | ICD-10-CM | POA: Diagnosis not present

## 2022-11-09 NOTE — Therapy (Signed)
OUTPATIENT PHYSICAL THERAPY THORACOLUMBAR TREATMENT   Patient Name: Stephanie Baker MRN: [id number] DOB:[birth year], 81 y.o., female Today's Date: 11/09/2022   PT End of Session - 11/09/22 1450     Visit Number 5    Number of Visits 30    Date for PT Re-Evaluation 01/21/23    Authorization Type MCR    Progress Note Due on Visit 10    PT Start Time 1448    PT Stop Time 1527    PT Time Calculation (min) 39 min    Behavior During Therapy WFL for tasks assessed/performed              Past Medical History:  Diagnosis Date   Arthritis    OA   GERD (gastroesophageal reflux disease)    Hematuria    idiopathic- [facility]   Hyperlipemia    Hypertension    Hypothyroidism    Past Surgical History:  Procedure Laterality Date   ABDOMINAL HYSTERECTOMY     APPENDECTOMY     BACK SURGERY     lumbar   EYE SURGERY     bilateral cataract extraction with IOL   GASTROC RECESSION EXTREMITY Left 02/23/2018   Procedure: Left Gastroc Recession;  Surgeon: Wylene Simmer, MD;  Location: [place];  Service: Orthopedics;  Laterality: Left;   HAND SURGERY     left thumb MP   JOINT REPLACEMENT     right knee   PARTIAL KNEE ARTHROPLASTY  11/14/2012   Procedure: UNICOMPARTMENTAL KNEE;  Surgeon: Mauri Pole, MD;  Location: [facility];  Service: Orthopedics;  Laterality: Left;   TARSAL METATARSAL ARTHRODESIS Left 02/23/2018   Procedure: Left First and Second Tarsometatarsal Arthrodesis;  Surgeon: Wylene Simmer, MD;  Location: [facility];  Service: Orthopedics;  Laterality: Left;   thumb surgery     left   VAGINAL HYSTERECTOMY     Patient Active Problem List   Diagnosis Date Noted   Multiple subsegmental pulmonary emboli without acute cor pulmonale ([place]) 06/08/2022   Acute deep vein thrombosis (DVT) of femoral vein of right lower extremity ([place]) 06/08/2022   Acquired hypothyroidism 06/08/2022   Slow transit constipation 06/08/2022   Spinal stenosis  of thoracolumbar region 06/08/2022   Primary hypertension 06/08/2022   S/P left UKR 11/15/2012   Expected blood loss anemia 11/15/2012   Overweight (BMI 25.0-29.9) 11/15/2012     REFERRING PROVIDER: Burt Knack, PA-C  REFERRING DIAG:    Z98.1 (ICD-10-CM) - Arthrodesis status    Rationale for Evaluation and Treatment Rehabilitation  THERAPY DIAG:  Other low back pain  Muscle weakness (generalized)  Difficulty in walking, not elsewhere classified  ONSET DATE: approx early-mid 2022, surgery May 2023  SUBJECTIVE:  SUBJECTIVE STATEMENT: Pt reports no extra soreness after last session.  Today she was transported to pool from lobby, "that helped".    From EVAL: I can't walk. My husband passed away last October 22, 2023- I was having difficulty walking and back pain before then but put it off. Apr 28, 2022 has back surgery. My back still aches and I cannot walk without a walker. Balance is okay but I am really careful. Less intense pain when I sit and increases with standing. Both legs feel abnormal , almost numb but can feel my feet when walking. I am in more pain since surgery. PT at [facility] after surgery. Noted more frequent urination but no loss of control.   PERTINENT HISTORY:   Bil partial TKA, h/o DVT  Diagnosis:  S/p T11-L4 reinstrumentation, T12-L1 through L2-3 SPO's [facility] 04/28/22) 2. Hx Trans psoas fusion at L3-4 followed by unilateral posterior instrumentation. Her imaging however shows that she has solid fusion of the L4-5 and L5-S1 levels. This could represent a congenital fusion but the overall appearance is more consistent with a previous fusion at those levels    PAIN:  Are you having pain? Yes: NPRS scale:4-5/10 Pain location: across the whole back Pain description: constant with  fluctuation in intensity Aggravating factors: standing/walking Relieving factors: sit/lay   PRECAUTIONS: None  WEIGHT BEARING RESTRICTIONS No  FALLS:  Has patient fallen in last 6 months? No  LIVING ENVIRONMENT:  Lives in: Other [facility] Has following equipment at home: Environmental consultant - 2 wheeled and Wheelchair (power)  OCCUPATION: retired PT  PLOF: Independent  PATIENT GOALS walk independently, decrease pain   OBJECTIVE:   PATIENT SURVEYS:  FOTO [place]: Unable to fully extend either knee in seated  POSTURE: post pelvic tilt with decreased lordosis   LOWER EXTREMITY MMT:    MMT (age norm goal) Right eval Left eval  Hip flexion (21) 19.5lb 16.7  Hip extension    Hip abduction    Hip adduction    Hip internal rotation    Hip external rotation    Knee flexion 10.7 9.2  Knee extension (46) 20.7 23.3  Ankle dorsiflexion    Ankle plantarflexion    Ankle inversion    Ankle eversion     (Blank rows = not tested)   FUNCTIONAL TESTS:  5 times sit to stand: 14s  GAIT: Distance walked: to clinic and to<>from pool Assistive device utilized: Environmental consultant - 2 wheeled Level of assistance: Modified independence Comments: slow cadence, flat foot strike, flexed posture    TODAY'S TREATMENT  Treatment                            11/09/22:  Pt seen for aquatic therapy today.  Treatment took place in water 3.25-4.5 ft in depth at the [place]. Temp of water was 92.  Pt entered/exited the pool via stairs using step to pattern, with supervision, with hand  rail.  She utilizes RW to/from pool.   * Holding black barbell:  walking forward,back and side stepping * Holding wall:  heel raises (4 ft) x 10; hip abdct/addct x 10;  hip ext (small range) x 10; Hip flexor stretch x 10s x 2 (limited tolerance on R);  * return to walking forward with cues to even step length; forward walking kicks (LAQ) * holding wall:  side to side lunge; hip openers; hip  circles (circumduction into flex/ext) x 5 x 2 *  holding  squats x 10 (at 16f6")  * TrA set with short blue noodle pull down x 12 * at stairs:  forward lunge for hip flexor/knee flex stretch then hamstring stretch (foot on 2nd step) x 4 reps each LE * bilat gastroc stretch with heels off of step * fig 4 x 5-10 sec x 2 reps each  * Return to walking forward with barbell with cues for increased step length LLE  Pt requires the buoyancy and hydrostatic pressure of water for support, and to offload joints by unweighting joint load by at least 50 % in navel deep water and by at least 75-80% in chest to neck deep water.  Viscosity of the water is needed for resistance of strengthening. Water current perturbations provides challenge to standing balance requiring increased core activation.  PATIENT EDUCATION:  Education details: aquatic progressions and modifications Person educated: Patient Education method: EConsulting civil engineer Demonstration, Tactile cues, Verbal cues, and Handouts Education comprehension: verbalized understanding, returned demonstration, verbal cues required, tactile cues required, and needs further education   HOME EXERCISE PROGRAM: [id number]  ASSESSMENT:  CLINICAL IMPRESSION Pt observed standing with Rt hip in flexed position with knee flexion.  Cues given to increased step length on L.  She reports increased L ant hip/groin with attempts to extend RLE / bigger step length with LLE.  No decrease in pain during session; remained around 4/10. Encouraged pt to stretch hamstrings on land in seated position after using heating pad to them.  Goals are ongoing.   OBJECTIVE IMPAIRMENTS Abnormal gait, decreased activity tolerance, decreased strength, increased muscle spasms, impaired flexibility, impaired sensation, improper body mechanics, postural dysfunction, and pain.   ACTIVITY LIMITATIONS standing, squatting, stairs, transfers, and locomotion level  PARTICIPATION LIMITATIONS: meal prep,  cleaning, laundry, shopping, and community activity  PERSONAL FACTORS Past/current experiences and Time since onset of injury/illness/exacerbation are also affecting patient's functional outcome.   REHAB POTENTIAL: Good  CLINICAL DECISION MAKING: Stable/uncomplicated  EVALUATION COMPLEXITY: Low   GOALS: Goals reviewed with patient? Yes  SHORT TERM GOALS: Target date: 11/05/2022  Pt will find an appropriate level of intensity in aquatic exercise for strengthening Baseline:has not started yet Goal status: INITIAL  2.  Pt will verbalize awareness of postural alignment in seated and standing through ADLs Baseline: began educating at eval Goal status: INITIAL   LONG TERM GOALS: Target date: 01/21/23  Pt will meet stated MMT goals in chart Baseline: see chart Goal status: INITIAL  2.  Pt will be able to ambulate household distances without AD and without increase in pain Baseline: relies on walker at eval Goal status: INITIAL  3.  Average ain <=3/10 with ADLs Baseline: severe and limiting at eval Goal status: INITIAL  4.  Will tolerate community ambulation with AD less than walker Baseline: will progress as appropriate Goal status: INITIAL  PLAN: PT FREQUENCY: 2x/week  PT DURATION: other: 15 weeks  PLANNED INTERVENTIONS: Therapeutic exercises, Therapeutic activity, Neuromuscular re-education, Balance training, Gait training, Patient/Family education, Self Care, Joint mobilization, Stair training, Aquatic Therapy, Dry Needling, Electrical stimulation, Spinal mobilization, Cryotherapy, Moist heat, Taping, Manual therapy, and Re-evaluation.  PLAN FOR NEXT SESSION: continue aquatics, core, Low back STM, hamstring stretching  JKerin Perna PTA 11/09/22 3:38 PM [facility] [address] [place] [address]Phone: [phone]  Fax:  [phone]

## 2022-11-11 ENCOUNTER — Ambulatory Visit (HOSPITAL_BASED_OUTPATIENT_CLINIC_OR_DEPARTMENT_OTHER): Payer: Medicare Other | Admitting: Physical Therapy

## 2022-11-16 ENCOUNTER — Encounter (HOSPITAL_BASED_OUTPATIENT_CLINIC_OR_DEPARTMENT_OTHER): Payer: Self-pay | Admitting: Physical Therapy

## 2022-11-16 ENCOUNTER — Ambulatory Visit (HOSPITAL_BASED_OUTPATIENT_CLINIC_OR_DEPARTMENT_OTHER): Payer: Medicare Other | Admitting: Physical Therapy

## 2022-11-16 DIAGNOSIS — M6281 Muscle weakness (generalized): Secondary | ICD-10-CM | POA: Diagnosis not present

## 2022-11-16 DIAGNOSIS — R262 Difficulty in walking, not elsewhere classified: Secondary | ICD-10-CM

## 2022-11-16 DIAGNOSIS — M5459 Other low back pain: Secondary | ICD-10-CM

## 2022-11-16 NOTE — Therapy (Signed)
OUTPATIENT PHYSICAL THERAPY THORACOLUMBAR TREATMENT   Patient Name: Margurete Guaman MRN: [id number] DOB:07/16/1941, 81 y.o., female Today's Date: 11/16/2022   PT End of Session - 11/16/22 1404     Visit Number 6    Number of Visits 30    Date for PT Re-Evaluation 01/21/23    Authorization Type MCR    Progress Note Due on Visit 10    PT Start Time 1400    PT Stop Time 1440    PT Time Calculation (min) 40 min    Activity Tolerance Patient tolerated treatment well    Behavior During Therapy WFL for tasks assessed/performed              Past Medical History:  Diagnosis Date   Arthritis    OA   GERD (gastroesophageal reflux disease)    Hematuria    idiopathic- [facility]   Hyperlipemia    Hypertension    Hypothyroidism    Past Surgical History:  Procedure Laterality Date   ABDOMINAL HYSTERECTOMY     APPENDECTOMY     BACK SURGERY     lumbar   EYE SURGERY     bilateral cataract extraction with IOL   GASTROC RECESSION EXTREMITY Left 02/23/2018   Procedure: Left Gastroc Recession;  Surgeon: Wylene Simmer, MD;  Location: [place];  Service: Orthopedics;  Laterality: Left;   HAND SURGERY     left thumb MP   JOINT REPLACEMENT     right knee   PARTIAL KNEE ARTHROPLASTY  11/14/2012   Procedure: UNICOMPARTMENTAL KNEE;  Surgeon: Mauri Pole, MD;  Location: [facility];  Service: Orthopedics;  Laterality: Left;   TARSAL METATARSAL ARTHRODESIS Left 02/23/2018   Procedure: Left First and Second Tarsometatarsal Arthrodesis;  Surgeon: Wylene Simmer, MD;  Location: [place];  Service: Orthopedics;  Laterality: Left;   thumb surgery     left   VAGINAL HYSTERECTOMY     Patient Active Problem List   Diagnosis Date Noted   Multiple subsegmental pulmonary emboli without acute cor pulmonale ([facility]) 06/08/2022   Acute deep vein thrombosis (DVT) of femoral vein of right lower extremity ([place]) 06/08/2022   Acquired hypothyroidism 06/08/2022    Slow transit constipation 06/08/2022   Spinal stenosis of thoracolumbar region 06/08/2022   Primary hypertension 06/08/2022   S/P left UKR 11/15/2012   Expected blood loss anemia 11/15/2012   Overweight (BMI 25.0-29.9) 11/15/2012     REFERRING PROVIDER: Burt Knack, PA-C  REFERRING DIAG:    Z98.1 (ICD-10-CM) - Arthrodesis status    Rationale for Evaluation and Treatment Rehabilitation  THERAPY DIAG:  Other low back pain  Difficulty in walking, not elsewhere classified  Muscle weakness (generalized)  ONSET DATE: approx early-mid 2022, surgery May 2023  SUBJECTIVE:  SUBJECTIVE STATEMENT: "I feel about the same".  "I enjoy walking in the water".    From EVAL: I can't walk. My husband passed away last 10/20/2023- I was having difficulty walking and back pain before then but put it off. Apr 28, 2022 has back surgery. My back still aches and I cannot walk without a walker. Balance is okay but I am really careful. Less intense pain when I sit and increases with standing. Both legs feel abnormal , almost numb but can feel my feet when walking. I am in more pain since surgery. PT at [facility] after surgery. Noted more frequent urination but no loss of control.   PERTINENT HISTORY:   Bil partial TKA, h/o DVT  Diagnosis:  S/p T11-L4 reinstrumentation, T12-L1 through L2-3 SPO's [facility] 04/28/22) 2. Hx Trans psoas fusion at L3-4 followed by unilateral posterior instrumentation. Her imaging however shows that she has solid fusion of the L4-5 and L5-S1 levels. This could represent a congenital fusion but the overall appearance is more consistent with a previous fusion at those levels    PAIN:  Are you having pain? Yes: NPRS scale:5/10 Pain location: across the whole back Pain description: constant with  fluctuation in intensity Aggravating factors: standing/walking Relieving factors: sit/lay   PRECAUTIONS: None  WEIGHT BEARING RESTRICTIONS No  FALLS:  Has patient fallen in last 6 months? No  LIVING ENVIRONMENT:  Lives in: Other [facility] Has following equipment at home: Environmental consultant - 2 wheeled and Wheelchair (power)  OCCUPATION: retired PT  PLOF: Independent  PATIENT GOALS walk independently, decrease pain   OBJECTIVE:   PATIENT SURVEYS:  FOTO [place]: Unable to fully extend either knee in seated  POSTURE: post pelvic tilt with decreased lordosis   LOWER EXTREMITY MMT:    MMT (age norm goal) Right eval Left eval  Hip flexion (21) 19.5lb 16.7  Hip extension    Hip abduction    Hip adduction    Hip internal rotation    Hip external rotation    Knee flexion 10.7 9.2  Knee extension (46) 20.7 23.3  Ankle dorsiflexion    Ankle plantarflexion    Ankle inversion    Ankle eversion     (Blank rows = not tested)   FUNCTIONAL TESTS:  5 times sit to stand: 14s  GAIT: Distance walked: to clinic and to<>from pool Assistive device utilized: Environmental consultant - 2 wheeled Level of assistance: Modified independence Comments: slow cadence, flat foot strike, flexed posture    TODAY'S TREATMENT  Treatment                            11/16/22:  Pt seen for aquatic therapy today.  Treatment took place in water 3.25-4.5 ft in depth at the [place]. Temp of water was 92.  Pt entered/exited the pool via stairs using step to pattern, with supervision, with hand  rail.  She utilizes RW to/from pool.   * Holding black barbell:  walking forward,back and side stepping * Holding wall:  heel raises (4 ft) x 10; hip abdct/addct x 10;  hip ext (small range) x 10; hip circles x 10; side to side lunge;  single arm overhead reach - x 8 each;  * return to walking holding barbell: forward/ backward (cues for even step length); side step with mini squat R/L * Holding  wall:  single leg clam x 5 x 2; hip openers x 5 each * at bench  with blue step under feet:  STS with white barbell and 3 sec lowering down.  * alternating toe taps to 1st step with light UE support  * Hip flexor stretch x 10s x 2; hamstring stretch with heel on 2nd step x 15s; bilat calf stretch with heels off of step * TrA set with short blue noodle pull down x 20 * Return to walking forward/backward with barbell with cues for increased step length LLE   Pt requires the buoyancy and hydrostatic pressure of water for support, and to offload joints by unweighting joint load by at least 50 % in navel deep water and by at least 75-80% in chest to neck deep water.  Viscosity of the water is needed for resistance of strengthening. Water current perturbations provides challenge to standing balance requiring increased core activation.  PATIENT EDUCATION:  Education details: aquatic progressions and modifications Person educated: Patient Education method: Consulting civil engineer, Demonstration, Tactile cues, Verbal cues, and Handouts Education comprehension: verbalized understanding, returned demonstration, verbal cues required, tactile cues required, and needs further education   HOME EXERCISE PROGRAM: [id number]  ASSESSMENT:  CLINICAL IMPRESSION Pt reported some pain in midback with initial unilateral overhead reach; improved with time/ repetition. Encouraged pt to trial in hooklying position in bed, while spine is supported.  R ant hip and hamstring remain tight.  Her Lt step length remains shorted; continued focus on evening out stride.  She required light UE support for R SLS with dynamic LLE tapping to step; observed with forward lean when attempting Rt SLS.  Her pain level remained unchanged while in water, however she reported that she enjoys walking/ exercising in water as it is less difficult than on land.  Goals are ongoing.   OBJECTIVE IMPAIRMENTS Abnormal gait, decreased activity tolerance, decreased  strength, increased muscle spasms, impaired flexibility, impaired sensation, improper body mechanics, postural dysfunction, and pain.   ACTIVITY LIMITATIONS standing, squatting, stairs, transfers, and locomotion level  PARTICIPATION LIMITATIONS: meal prep, cleaning, laundry, shopping, and community activity  PERSONAL FACTORS Past/current experiences and Time since onset of injury/illness/exacerbation are also affecting patient's functional outcome.   REHAB POTENTIAL: Good  CLINICAL DECISION MAKING: Stable/uncomplicated  EVALUATION COMPLEXITY: Low   GOALS: Goals reviewed with patient? Yes  SHORT TERM GOALS: Target date: 11/05/2022  Pt will find an appropriate level of intensity in aquatic exercise for strengthening Baseline:has not started yet Goal status: INITIAL  2.  Pt will verbalize awareness of postural alignment in seated and standing through ADLs Baseline: began educating at eval Goal status: INITIAL   LONG TERM GOALS: Target date: 01/21/23  Pt will meet stated MMT goals in chart Baseline: see chart Goal status: INITIAL  2.  Pt will be able to ambulate household distances without AD and without increase in pain Baseline: relies on walker at eval Goal status: INITIAL  3.  Average ain <=3/10 with ADLs Baseline: severe and limiting at eval Goal status: INITIAL  4.  Will tolerate community ambulation with AD less than walker Baseline: will progress as appropriate Goal status: INITIAL  PLAN: PT FREQUENCY: 2x/week  PT DURATION: other: 15 weeks  PLANNED INTERVENTIONS: Therapeutic exercises, Therapeutic activity, Neuromuscular re-education, Balance training, Gait training, Patient/Family education, Self Care, Joint mobilization, Stair training, Aquatic Therapy, Dry Needling, Electrical stimulation, Spinal mobilization, Cryotherapy, Moist heat, Taping, Manual therapy, and Re-evaluation.  PLAN FOR NEXT SESSION: continue aquatics, core, Low back STM, hamstring  stretching  Kerin Perna, PTA 11/16/22 2:42 PM [facility] [address]  [place], [place], [address] Phone: [phone]   Fax:  [phone]

## 2022-11-23 ENCOUNTER — Ambulatory Visit (HOSPITAL_BASED_OUTPATIENT_CLINIC_OR_DEPARTMENT_OTHER): Payer: Medicare Other | Admitting: Physical Therapy

## 2022-11-23 ENCOUNTER — Encounter (HOSPITAL_BASED_OUTPATIENT_CLINIC_OR_DEPARTMENT_OTHER): Payer: Self-pay | Admitting: Physical Therapy

## 2022-11-23 DIAGNOSIS — R262 Difficulty in walking, not elsewhere classified: Secondary | ICD-10-CM | POA: Diagnosis not present

## 2022-11-23 DIAGNOSIS — M5459 Other low back pain: Secondary | ICD-10-CM | POA: Diagnosis not present

## 2022-11-23 DIAGNOSIS — M6281 Muscle weakness (generalized): Secondary | ICD-10-CM

## 2022-11-23 NOTE — Therapy (Signed)
OUTPATIENT PHYSICAL THERAPY THORACOLUMBAR TREATMENT   Patient Name: Stephanie Baker MRN: [id number] DOB:05/12/1941, 81 y.o., female Today's Date: 11/23/2022   PT End of Session - 11/23/22 1409     Visit Number 7    Number of Visits 30    Date for PT Re-Evaluation 01/21/23    Authorization Type MCR    Progress Note Due on Visit 10    PT Start Time 1400    PT Stop Time 1442    PT Time Calculation (min) 42 min    Activity Tolerance Patient tolerated treatment well    Behavior During Therapy WFL for tasks assessed/performed              Past Medical History:  Diagnosis Date   Arthritis    OA   GERD (gastroesophageal reflux disease)    Hematuria    idiopathic- [facility]   Hyperlipemia    Hypertension    Hypothyroidism    Past Surgical History:  Procedure Laterality Date   ABDOMINAL HYSTERECTOMY     APPENDECTOMY     BACK SURGERY     lumbar   EYE SURGERY     bilateral cataract extraction with IOL   GASTROC RECESSION EXTREMITY Left 02/23/2018   Procedure: Left Gastroc Recession;  Surgeon: Wylene Simmer, MD;  Location: [place];  Service: Orthopedics;  Laterality: Left;   HAND SURGERY     left thumb MP   JOINT REPLACEMENT     right knee   PARTIAL KNEE ARTHROPLASTY  11/14/2012   Procedure: UNICOMPARTMENTAL KNEE;  Surgeon: Mauri Pole, MD;  Location: [facility];  Service: Orthopedics;  Laterality: Left;   TARSAL METATARSAL ARTHRODESIS Left 02/23/2018   Procedure: Left First and Second Tarsometatarsal Arthrodesis;  Surgeon: Wylene Simmer, MD;  Location: [place];  Service: Orthopedics;  Laterality: Left;   thumb surgery     left   VAGINAL HYSTERECTOMY     Patient Active Problem List   Diagnosis Date Noted   Multiple subsegmental pulmonary emboli without acute cor pulmonale (HCC) 06/08/2022   Acute deep vein thrombosis (DVT) of femoral vein of right lower extremity ([place]) 06/08/2022   Acquired hypothyroidism 06/08/2022    Slow transit constipation 06/08/2022   Spinal stenosis of thoracolumbar region 06/08/2022   Primary hypertension 06/08/2022   S/P left UKR 11/15/2012   Expected blood loss anemia 11/15/2012   Overweight (BMI 25.0-29.9) 11/15/2012     REFERRING PROVIDER: Burt Knack, PA-C  REFERRING DIAG:    Z98.1 (ICD-10-CM) - Arthrodesis status    Rationale for Evaluation and Treatment Rehabilitation  THERAPY DIAG:  Other low back pain  Difficulty in walking, not elsewhere classified  Muscle weakness (generalized)  ONSET DATE: approx early-mid 2022, surgery May 2023  SUBJECTIVE:  SUBJECTIVE STATEMENT: "I'm beginning to feel a little bit better".    Pt reports she has been riding stationary bicycle for 5-10 min daily since Friday.     From EVAL: I can't walk. My husband passed away last 02-Nov-2023- I was having difficulty walking and back pain before then but put it off. Apr 28, 2022 has back surgery. My back still aches and I cannot walk without a walker. Balance is okay but I am really careful. Less intense pain when I sit and increases with standing. Both legs feel abnormal , almost numb but can feel my feet when walking. I am in more pain since surgery. PT at [facility] after surgery. Noted more frequent urination but no loss of control.   PERTINENT HISTORY:   Bil partial TKA, h/o DVT  Diagnosis:  S/p T11-L4 reinstrumentation, T12-L1 through L2-3 SPO's [facility] 04/28/22) 2. Hx Trans psoas fusion at L3-4 followed by unilateral posterior instrumentation. Her imaging however shows that she has solid fusion of the L4-5 and L5-S1 levels. This could represent a congenital fusion but the overall appearance is more consistent with a previous fusion at those levels    PAIN:  Are you having pain? Yes: NPRS  scale:5/10 Pain location: across the whole back Pain description: constant with fluctuation in intensity Aggravating factors: standing/walking Relieving factors: sit/lay   PRECAUTIONS: None  WEIGHT BEARING RESTRICTIONS No  FALLS:  Has patient fallen in last 6 months? No  LIVING ENVIRONMENT:  Lives in: Other [facility] Has following equipment at home: Environmental consultant - 2 wheeled and Wheelchair (power)  OCCUPATION: retired PT  PLOF: Independent  PATIENT GOALS walk independently, decrease pain   OBJECTIVE:   PATIENT SURVEYS:  FOTO [place]: Unable to fully extend either knee in seated  POSTURE: post pelvic tilt with decreased lordosis   LOWER EXTREMITY MMT:    MMT (age norm goal) Right eval Left eval  Hip flexion (21) 19.5lb 16.7  Hip extension    Hip abduction    Hip adduction    Hip internal rotation    Hip external rotation    Knee flexion 10.7 9.2  Knee extension (46) 20.7 23.3  Ankle dorsiflexion    Ankle plantarflexion    Ankle inversion    Ankle eversion     (Blank rows = not tested)   FUNCTIONAL TESTS:  5 times sit to stand: 14s  GAIT: Distance walked: to clinic and to<>from pool Assistive device utilized: Environmental consultant - 2 wheeled Level of assistance: Modified independence Comments: slow cadence, flat foot strike, flexed posture    TODAY'S TREATMENT  Treatment                            11/23/22:  Pt seen for aquatic therapy today.  Treatment took place in water 3.25-4.5 ft in depth at the [facility]. Temp of water was 92.  Pt entered/exited the pool via stairs using step-to pattern, with supervision, with hand  rail.  She utilizes RW to/from pool.   * Holding black barbell:  walking forward,back and side stepping; marching  * Holding wall: single arm overhead reach - x 5 each; high knee marching;  heel raises (4 ft) x 15; hip abdct/addct x 10;  hip ext (small range) x 10 * return to walking forward / backward holding white  barbell * holding wall:  hip circles x 10; side to side lunge * holding blue short noodle:  TrA set  with noodle pull down x 10 * Holding wall:  single leg clam x 10; L stretch (difficulty with form) * Holding yellow hand floats:  SLS with toe touch front, side back x 8 each * wall push ups x 12 * seated at bench with feet on blue step: cycling; LAQ; STS holding white barbell x 10; fig 4 position (challenging to attain) * Hip flexor stretch x 15s; hamstring stretch with heel on 2nd step x 20s; bilat calf stretch with heels off of step   Pt requires the buoyancy and hydrostatic pressure of water for support, and to offload joints by unweighting joint load by at least 50 % in navel deep water and by at least 75-80% in chest to neck deep water.  Viscosity of the water is needed for resistance of strengthening. Water current perturbations provides challenge to standing balance requiring increased core activation.  PATIENT EDUCATION:  Education details: aquatic progressions and modifications Person educated: Patient Education method: Consulting civil engineer, Demonstration, Tactile cues, Verbal cues, and Handouts Education comprehension: verbalized understanding, returned demonstration, verbal cues required, tactile cues required, and needs further education   HOME EXERCISE PROGRAM: [id number]  ASSESSMENT:  CLINICAL IMPRESSION Pt reports continued "cramping" in Lt abdomen/hip with walking with R hip ext.   Her Lt step length remains shorted; continued focus on evening out stride. Pain level remained unchanged while in water, however she reported that she enjoys walking/ exercising in water as it is less difficult than on land.  Goals are ongoing.   OBJECTIVE IMPAIRMENTS Abnormal gait, decreased activity tolerance, decreased strength, increased muscle spasms, impaired flexibility, impaired sensation, improper body mechanics, postural dysfunction, and pain.   ACTIVITY LIMITATIONS standing, squatting, stairs,  transfers, and locomotion level  PARTICIPATION LIMITATIONS: meal prep, cleaning, laundry, shopping, and community activity  PERSONAL FACTORS Past/current experiences and Time since onset of injury/illness/exacerbation are also affecting patient's functional outcome.   REHAB POTENTIAL: Good  CLINICAL DECISION MAKING: Stable/uncomplicated  EVALUATION COMPLEXITY: Low   GOALS: Goals reviewed with patient? Yes  SHORT TERM GOALS: Target date: 11/05/2022  Pt will find an appropriate level of intensity in aquatic exercise for strengthening Baseline:has  Goal status: Achieved - 11/28  2.  Pt will verbalize awareness of postural alignment in seated and standing through ADLs Baseline:  Goal status: Ongoing - 11/28   LONG TERM GOALS: Target date: 01/21/23  Pt will meet stated MMT goals in chart Baseline: see chart Goal status: INITIAL  2.  Pt will be able to ambulate household distances without AD and without increase in pain Baseline: relies on walker -11/28 Goal status: ongoing   3.  Average ain <=3/10 with ADLs Baseline: severe and limiting at eval Goal status: INITIAL  4.  Will tolerate community ambulation with AD less than walker Baseline: will progress as appropriate Goal status: INITIAL  PLAN: PT FREQUENCY: 2x/week  PT DURATION: other: 15 weeks  PLANNED INTERVENTIONS: Therapeutic exercises, Therapeutic activity, Neuromuscular re-education, Balance training, Gait training, Patient/Family education, Self Care, Joint mobilization, Stair training, Aquatic Therapy, Dry Needling, Electrical stimulation, Spinal mobilization, Cryotherapy, Moist heat, Taping, Manual therapy, and Re-evaluation.  PLAN FOR NEXT SESSION: continue aquatics, core, Low back STM, hamstring stretching  Kerin Perna, PTA 11/23/22 5:35 PM [facility] [address] [place], [place], [address] Phone: [phone]   Fax:  [phone]

## 2022-11-30 ENCOUNTER — Ambulatory Visit (HOSPITAL_BASED_OUTPATIENT_CLINIC_OR_DEPARTMENT_OTHER): Payer: Medicare Other | Attending: Orthopedic Surgery | Admitting: Physical Therapy

## 2022-11-30 ENCOUNTER — Encounter (HOSPITAL_BASED_OUTPATIENT_CLINIC_OR_DEPARTMENT_OTHER): Payer: Self-pay | Admitting: Physical Therapy

## 2022-11-30 DIAGNOSIS — R262 Difficulty in walking, not elsewhere classified: Secondary | ICD-10-CM | POA: Diagnosis not present

## 2022-11-30 DIAGNOSIS — M5459 Other low back pain: Secondary | ICD-10-CM | POA: Insufficient documentation

## 2022-11-30 DIAGNOSIS — M6281 Muscle weakness (generalized): Secondary | ICD-10-CM | POA: Insufficient documentation

## 2022-11-30 NOTE — Therapy (Signed)
OUTPATIENT PHYSICAL THERAPY THORACOLUMBAR TREATMENT   Patient Name: Stephanie Baker MRN: [id number] DOB:[birth year], 81 y.o., female Today's Date: 11/30/2022   PT End of Session - 11/30/22 1400     Visit Number 8    Number of Visits 30    Date for PT Re-Evaluation 01/21/23    Authorization Type MCR    Progress Note Due on Visit 10    PT Start Time 1400    PT Stop Time 1440    PT Time Calculation (min) 40 min              Past Medical History:  Diagnosis Date   Arthritis    OA   GERD (gastroesophageal reflux disease)    Hematuria    idiopathic- [facility]   Hyperlipemia    Hypertension    Hypothyroidism    Past Surgical History:  Procedure Laterality Date   ABDOMINAL HYSTERECTOMY     APPENDECTOMY     BACK SURGERY     lumbar   EYE SURGERY     bilateral cataract extraction with IOL   GASTROC RECESSION EXTREMITY Left 02/23/2018   Procedure: Left Gastroc Recession;  Surgeon: Wylene Simmer, MD;  Location: [place];  Service: Orthopedics;  Laterality: Left;   HAND SURGERY     left thumb MP   JOINT REPLACEMENT     right knee   PARTIAL KNEE ARTHROPLASTY  11/14/2012   Procedure: UNICOMPARTMENTAL KNEE;  Surgeon: Mauri Pole, MD;  Location: [facility];  Service: Orthopedics;  Laterality: Left;   TARSAL METATARSAL ARTHRODESIS Left 02/23/2018   Procedure: Left First and Second Tarsometatarsal Arthrodesis;  Surgeon: Wylene Simmer, MD;  Location: [place];  Service: Orthopedics;  Laterality: Left;   thumb surgery     left   VAGINAL HYSTERECTOMY     Patient Active Problem List   Diagnosis Date Noted   Multiple subsegmental pulmonary emboli without acute cor pulmonale (HCC) 06/08/2022   Acute deep vein thrombosis (DVT) of femoral vein of right lower extremity ([place]) 06/08/2022   Acquired hypothyroidism 06/08/2022   Slow transit constipation 06/08/2022   Spinal stenosis of thoracolumbar region 06/08/2022   Primary hypertension  06/08/2022   S/P left UKR 11/15/2012   Expected blood loss anemia 11/15/2012   Overweight (BMI 25.0-29.9) 11/15/2012     REFERRING PROVIDER: Burt Knack, PA-C  REFERRING DIAG:    Z98.1 (ICD-10-CM) - Arthrodesis status    Rationale for Evaluation and Treatment Rehabilitation  THERAPY DIAG:  Other low back pain  Difficulty in walking, not elsewhere classified  Muscle weakness (generalized)  ONSET DATE: approx early-mid 2022, surgery May 2023  SUBJECTIVE:  SUBJECTIVE STATEMENT: "My back hurts today".  "I'm not able to raise my arm overhead while I lay on my back in bed".     From EVAL: I can't walk. My husband passed away last 23-Oct-2023- I was having difficulty walking and back pain before then but put it off. Apr 28, 2022 has back surgery. My back still aches and I cannot walk without a walker. Balance is okay but I am really careful. Less intense pain when I sit and increases with standing. Both legs feel abnormal , almost numb but can feel my feet when walking. I am in more pain since surgery. PT at [facility] after surgery. Noted more frequent urination but no loss of control.   PERTINENT HISTORY:   Bil partial TKA, h/o DVT  Diagnosis:  S/p T11-L4 reinstrumentation, T12-L1 through L2-3 SPO's [facility] 04/28/22) 2. Hx Trans psoas fusion at L3-4 followed by unilateral posterior instrumentation. Her imaging however shows that she has solid fusion of the L4-5 and L5-S1 levels. This could represent a congenital fusion but the overall appearance is more consistent with a previous fusion at those levels    PAIN:  Are you having pain? Yes: NPRS scale:5/10 Pain location: across the whole back Pain description: constant with fluctuation in intensity Aggravating factors: standing/walking Relieving  factors: sit/lay   PRECAUTIONS: None  WEIGHT BEARING RESTRICTIONS No  FALLS:  Has patient fallen in last 6 months? No  LIVING ENVIRONMENT:  Lives in: Other [facility] Has following equipment at home: Environmental consultant - 2 wheeled and Wheelchair (power)  OCCUPATION: retired PT  PLOF: Independent  PATIENT GOALS walk independently, decrease pain   OBJECTIVE:   PATIENT SURVEYS:  FOTO [place]: Unable to fully extend either knee in seated  POSTURE: post pelvic tilt with decreased lordosis   LOWER EXTREMITY MMT:    MMT (age norm goal) Right eval Left eval  Hip flexion (21) 19.5lb 16.7  Hip extension    Hip abduction    Hip adduction    Hip internal rotation    Hip external rotation    Knee flexion 10.7 9.2  Knee extension (46) 20.7 23.3  Ankle dorsiflexion    Ankle plantarflexion    Ankle inversion    Ankle eversion     (Blank rows = not tested)   FUNCTIONAL TESTS:  5 times sit to stand: 14s  GAIT: Distance walked: to clinic and to<>from pool Assistive device utilized: Environmental consultant - 2 wheeled Level of assistance: Modified independence Comments: slow cadence, flat foot strike, flexed posture    TODAY'S TREATMENT  Treatment                            11/23/22:  Pt seen for aquatic therapy today.  Treatment took place in water 3.25-4.25 ft in depth at the [facility] pool. Temp of water was 92.  Pt entered/exited the pool via stairs using step-to pattern, with supervision, with hand  rail.  She is transported to/from pool.   * Holding black barbell:  walking forward,back and side stepping; cues for upright posture * staggered stance with forward/ backward weight shift and single arm row with resistance bell x 10 each  * static wide stance with reciprocal arm swing with resistance bells, then bilat shoulder abdct / add  * wall push up/ push offs, utilizing ankle strategy for reactive balance practice * holding wall: heel toe raises x 15; squats x 15   * side  to side lunges with intermittent UE to steady * Holding wall: single arm overhead reach - x 5 each; hip circles with straight knee x 10 each * holding barbell:  circumducting leg walking forward/backward * at bench with feet on blue step:  STS with hip hinge and arm reach x 10; cycling with hands on bench  Pt requires the buoyancy and hydrostatic pressure of water for support, and to offload joints by unweighting joint load by at least 50 % in navel deep water and by at least 75-80% in chest to neck deep water.  Viscosity of the water is needed for resistance of strengthening. Water current perturbations provides challenge to standing balance requiring increased core activation.  PATIENT EDUCATION:  Education details: aquatic progressions and modifications Person educated: Patient Education method: Explanation, Demonstration, Tactile cues, Verbal cues, and Handouts Education comprehension: verbalized understanding, returned demonstration, verbal cues required, tactile cues required, and needs further education   HOME EXERCISE PROGRAM: [id number]  ASSESSMENT:  CLINICAL IMPRESSION Pt reports continued "cramping" in Lt lower abdomen with walking. Trialed exercises with less UE support to work on reactive balance; tolerated with mild/moderate difficulty.  She reported no increase in pain during session.  Pt making gradual progress towards goals.    OBJECTIVE IMPAIRMENTS Abnormal gait, decreased activity tolerance, decreased strength, increased muscle spasms, impaired flexibility, impaired sensation, improper body mechanics, postural dysfunction, and pain.   ACTIVITY LIMITATIONS standing, squatting, stairs, transfers, and locomotion level  PARTICIPATION LIMITATIONS: meal prep, cleaning, laundry, shopping, and community activity  PERSONAL FACTORS Past/current experiences and Time since onset of injury/illness/exacerbation are also affecting patient's functional outcome.   REHAB  POTENTIAL: Good  CLINICAL DECISION MAKING: Stable/uncomplicated  EVALUATION COMPLEXITY: Low   GOALS: Goals reviewed with patient? Yes  SHORT TERM GOALS: Target date: 11/05/2022  Pt will find an appropriate level of intensity in aquatic exercise for strengthening Baseline:has  Goal status: Achieved - 11/28  2.  Pt will verbalize awareness of postural alignment in seated and standing through ADLs Baseline:  Goal status: Ongoing - 11/28   LONG TERM GOALS: Target date: 01/21/23  Pt will meet stated MMT goals in chart Baseline: see chart Goal status: INITIAL  2.  Pt will be able to ambulate household distances without AD and without increase in pain Baseline: relies on walker -11/28 Goal status: ongoing   3.  Average ain <=3/10 with ADLs Baseline: severe and limiting at eval Goal status: INITIAL  4.  Will tolerate community ambulation with AD less than walker Baseline: will progress as appropriate Goal status: INITIAL  PLAN: PT FREQUENCY: 2x/week  PT DURATION: other: 15 weeks  PLANNED INTERVENTIONS: Therapeutic exercises, Therapeutic activity, Neuromuscular re-education, Balance training, Gait training, Patient/Family education, Self Care, Joint mobilization, Stair training, Aquatic Therapy, Dry Needling, Electrical stimulation, Spinal mobilization, Cryotherapy, Moist heat, Taping, Manual therapy, and Re-evaluation.  PLAN FOR NEXT SESSION: continue aquatics, core, Low back STM, hamstring stretching  Kerin Perna, PTA 11/30/22 2:09 PM [facility] [place], [place], [address] Phone: [phone]   Fax:  [phone]

## 2022-12-02 ENCOUNTER — Ambulatory Visit (HOSPITAL_BASED_OUTPATIENT_CLINIC_OR_DEPARTMENT_OTHER): Payer: Medicare Other | Admitting: Physical Therapy

## 2022-12-02 ENCOUNTER — Encounter (HOSPITAL_BASED_OUTPATIENT_CLINIC_OR_DEPARTMENT_OTHER): Payer: Self-pay | Admitting: Physical Therapy

## 2022-12-02 DIAGNOSIS — M6281 Muscle weakness (generalized): Secondary | ICD-10-CM

## 2022-12-02 DIAGNOSIS — R262 Difficulty in walking, not elsewhere classified: Secondary | ICD-10-CM | POA: Diagnosis not present

## 2022-12-02 DIAGNOSIS — M5459 Other low back pain: Secondary | ICD-10-CM | POA: Diagnosis not present

## 2022-12-02 NOTE — Therapy (Signed)
OUTPATIENT PHYSICAL THERAPY THORACOLUMBAR TREATMENT   Patient Name: Stephanie Baker MRN: [id number] DOB:07-16-1941, 81 y.o., female Today's Date: 12/02/2022   PT End of Session - 12/02/22 1411     Visit Number 9    Number of Visits 30    Date for PT Re-Evaluation 01/21/23    Authorization Type MCR    Progress Note Due on Visit 49    PT Start Time 1415    PT Stop Time 1457    PT Time Calculation (min) 42 min    Activity Tolerance Patient tolerated treatment well    Behavior During Therapy WFL for tasks assessed/performed              Past Medical History:  Diagnosis Date   Arthritis    OA   GERD (gastroesophageal reflux disease)    Hematuria    idiopathic- [facility]   Hyperlipemia    Hypertension    Hypothyroidism    Past Surgical History:  Procedure Laterality Date   ABDOMINAL HYSTERECTOMY     APPENDECTOMY     BACK SURGERY     lumbar   EYE SURGERY     bilateral cataract extraction with IOL   GASTROC RECESSION EXTREMITY Left 02/23/2018   Procedure: Left Gastroc Recession;  Surgeon: Wylene Simmer, MD;  Location: [place];  Service: Orthopedics;  Laterality: Left;   HAND SURGERY     left thumb MP   JOINT REPLACEMENT     right knee   PARTIAL KNEE ARTHROPLASTY  11/14/2012   Procedure: UNICOMPARTMENTAL KNEE;  Surgeon: Mauri Pole, MD;  Location: [facility];  Service: Orthopedics;  Laterality: Left;   TARSAL METATARSAL ARTHRODESIS Left 02/23/2018   Procedure: Left First and Second Tarsometatarsal Arthrodesis;  Surgeon: Wylene Simmer, MD;  Location: [place];  Service: Orthopedics;  Laterality: Left;   thumb surgery     left   VAGINAL HYSTERECTOMY     Patient Active Problem List   Diagnosis Date Noted   Multiple subsegmental pulmonary emboli without acute cor pulmonale (HCC) 06/08/2022   Acute deep vein thrombosis (DVT) of femoral vein of right lower extremity ([place]) 06/08/2022   Acquired hypothyroidism 06/08/2022    Slow transit constipation 06/08/2022   Spinal stenosis of thoracolumbar region 06/08/2022   Primary hypertension 06/08/2022   S/P left UKR 11/15/2012   Expected blood loss anemia 11/15/2012   Overweight (BMI 25.0-29.9) 11/15/2012     REFERRING PROVIDER: Burt Knack, PA-C  REFERRING DIAG:    Z98.1 (ICD-10-CM) - Arthrodesis status    Rationale for Evaluation and Treatment Rehabilitation  THERAPY DIAG:  Other low back pain  Difficulty in walking, not elsewhere classified  Muscle weakness (generalized)  ONSET DATE: approx early-mid 2022, surgery May 2023  Progress Note Reporting Period 10/04/22 to 12/02/2022   See note below for Objective Data and Assessment of Progress/Goals.      SUBJECTIVE:  SUBJECTIVE STATEMENT: My back hasn't gotten any better. Mid back in lower region hurts- points along midline lumbar spine. Constant N/T down bil LEs, vague area. Rubs lateral left thigh reporting a little pain there as well.     From EVAL: I can't walk. My husband passed away last 2023/10/25- I was having difficulty walking and back pain before then but put it off. Apr 28, 2022 has back surgery. My back still aches and I cannot walk without a walker. Balance is okay but I am really careful. Less intense pain when I sit and increases with standing. Both legs feel abnormal , almost numb but can feel my feet when walking. I am in more pain since surgery. PT at [facility] after surgery. Noted more frequent urination but no loss of control.   PERTINENT HISTORY:   Bil partial TKA, h/o DVT  Diagnosis:  S/p T11-L4 reinstrumentation, T12-L1 through L2-3 SPO's [facility] 04/28/22) 2. Hx Trans psoas fusion at L3-4 followed by unilateral posterior instrumentation. Her imaging however shows that she has solid fusion of  the L4-5 and L5-S1 levels. This could represent a congenital fusion but the overall appearance is more consistent with a previous fusion at those levels    PAIN:  Are you having pain? Yes: NPRS scale:4-5/10 Pain location: midline lower back Pain description: constant with fluctuation in intensity Aggravating factors: standing/walking Relieving factors: sit/lay   PRECAUTIONS: None  WEIGHT BEARING RESTRICTIONS No  FALLS:  Has patient fallen in last 6 months? No  LIVING ENVIRONMENT:  Lives in: Other [facility] Has following equipment at home: Environmental consultant - 2 wheeled and Wheelchair (power)  OCCUPATION: retired PT  PLOF: Independent  PATIENT GOALS walk independently, decrease pain   OBJECTIVE:   PATIENT SURVEYS:  FOTO EVAL: 46 12/02/22: 35  MUSCLE LENGTH: EVAL: Unable to fully extend either knee in seated 12/02/22: Rt -5, Lt 0  POSTURE:  EVAL: post pelvic tilt with decreased lordosis 12/02/22: stands with hips shifted anteriorly and post balance of shoulders   LOWER EXTREMITY MMT:    MMT (age norm goal) Right eval Left eval Rt/Lt 12/7  Hip flexion (21) 19.5lb 16.7 21.6/17.4  Hip extension     Hip abduction     Hip adduction     Hip internal rotation     Hip external rotation     Knee flexion 10.7 9.2 16.1/17.6  Knee extension (46) 20.7 23.3 27.2/ 25.1  Ankle dorsiflexion     Ankle plantarflexion     Ankle inversion     Ankle eversion      (Blank rows = not tested)   FUNCTIONAL TESTS:  5 times sit to stand: EVAL: 14s 12/02/22: 16s  GAIT: Distance walked: to clinic and to<>from pool Assistive device utilized: Environmental consultant - 2 wheeled Level of assistance: Modified independence Comments: demonstrated heel toe pattern that is minimal but notable, flexed through thoracic spine, increased cadence    TODAY'S TREATMENT  Treatment                            12/02/22:  Progress note measurements LTR Active hamstring stretch SKTC Sidelying clams STM lumbar spine in  sidelying  PATIENT EDUCATION:  Education details: Anatomy of condition, POC, HEP, exercise form/rationale Person educated: Patient Education method: Explanation, Demonstration, Tactile cues, Verbal cues, and Handouts Education comprehension: verbalized understanding, returned demonstration, verbal cues required, tactile cues required, and needs further education   HOME EXERCISE PROGRAM: [id number]  ASSESSMENT:  CLINICAL IMPRESSION  Patient has made objective improvements in strength and flexibility but continues to have pain in her back.  Noted that soft tissue massage to upper portion of surgical incision and surrounding paraspinals resulted in improved pain levels in her back.  Continues to stand with anterior translated hips resulting in a shortened posterior chain and will continue to work on this.   OBJECTIVE IMPAIRMENTS Abnormal gait, decreased activity tolerance, decreased strength, increased muscle spasms, impaired flexibility, impaired sensation, improper body mechanics, postural dysfunction, and pain.   ACTIVITY LIMITATIONS standing, squatting, stairs, transfers, and locomotion level  PARTICIPATION LIMITATIONS: meal prep, cleaning, laundry, shopping, and community activity  PERSONAL FACTORS Past/current experiences and Time since onset of injury/illness/exacerbation are also affecting patient's functional outcome.     GOALS: Goals reviewed with patient? Yes  SHORT TERM GOALS: Target date: 11/05/2022  Pt will find an appropriate level of intensity in aquatic exercise for strengthening Baseline:has  Goal status: Achieved - 11/28  2.  Pt will verbalize awareness of postural alignment in seated and standing through ADLs Baseline:  Goal status: Ongoing - 11/28   LONG TERM GOALS: Target date: 01/21/23  Pt will meet stated MMT goals in chart Baseline: see chart Goal status: INITIAL  2.  Pt will be able to ambulate household distances without AD and without increase in  pain Baseline: relies on walker -11/28 Goal status: ongoing   3.  Average ain <=3/10 with ADLs Baseline: severe and limiting at eval Goal status: INITIAL  4.  Will tolerate community ambulation with AD less than walker Baseline: will progress as appropriate Goal status: INITIAL  PLAN: PT FREQUENCY: 2x/week  PT DURATION: other: 15 weeks  PLANNED INTERVENTIONS: Therapeutic exercises, Therapeutic activity, Neuromuscular re-education, Balance training, Gait training, Patient/Family education, Self Care, Joint mobilization, Stair training, Aquatic Therapy, Dry Needling, Electrical stimulation, Spinal mobilization, Cryotherapy, Moist heat, Taping, Manual therapy, and Re-evaluation.  PLAN FOR NEXT SESSION: Soft tissue massage to thoracolumbar paraspinals, hamstring flexibility, continue to increase heel strike in gait  Trevonn Hallum C. Wilfrido Luedke PT, DPT 12/02/22 2:59 PM  [facility] [address] [place], [place], [address] Phone: [phone]   Fax:  [phone]

## 2022-12-07 ENCOUNTER — Ambulatory Visit (HOSPITAL_BASED_OUTPATIENT_CLINIC_OR_DEPARTMENT_OTHER): Payer: Medicare Other | Admitting: Physical Therapy

## 2022-12-07 DIAGNOSIS — R262 Difficulty in walking, not elsewhere classified: Secondary | ICD-10-CM | POA: Diagnosis not present

## 2022-12-07 DIAGNOSIS — M6281 Muscle weakness (generalized): Secondary | ICD-10-CM

## 2022-12-07 DIAGNOSIS — M5459 Other low back pain: Secondary | ICD-10-CM

## 2022-12-07 NOTE — Therapy (Signed)
OUTPATIENT PHYSICAL THERAPY THORACOLUMBAR TREATMENT   Patient Name: Stephanie Baker MRN: [id number] DOB:[birth year], 81 y.o., female Today's Date: 12/07/2022   PT End of Session - 12/07/22 1407     Visit Number 10    Number of Visits 30    Date for PT Re-Evaluation 01/21/23    Authorization Type MCR    Progress Note Due on Visit 28    PT Start Time 1401    PT Stop Time 1440    PT Time Calculation (min) 39 min    Activity Tolerance Patient tolerated treatment well    Behavior During Therapy WFL for tasks assessed/performed              Past Medical History:  Diagnosis Date   Arthritis    OA   GERD (gastroesophageal reflux disease)    Hematuria    idiopathic- [facility]   Hyperlipemia    Hypertension    Hypothyroidism    Past Surgical History:  Procedure Laterality Date   ABDOMINAL HYSTERECTOMY     APPENDECTOMY     BACK SURGERY     lumbar   EYE SURGERY     bilateral cataract extraction with IOL   GASTROC RECESSION EXTREMITY Left 02/23/2018   Procedure: Left Gastroc Recession;  Surgeon: Wylene Simmer, MD;  Location: [place];  Service: Orthopedics;  Laterality: Left;   HAND SURGERY     left thumb MP   JOINT REPLACEMENT     right knee   PARTIAL KNEE ARTHROPLASTY  11/14/2012   Procedure: UNICOMPARTMENTAL KNEE;  Surgeon: Mauri Pole, MD;  Location: [facility];  Service: Orthopedics;  Laterality: Left;   TARSAL METATARSAL ARTHRODESIS Left 02/23/2018   Procedure: Left First and Second Tarsometatarsal Arthrodesis;  Surgeon: Wylene Simmer, MD;  Location: [place];  Service: Orthopedics;  Laterality: Left;   thumb surgery     left   VAGINAL HYSTERECTOMY     Patient Active Problem List   Diagnosis Date Noted   Multiple subsegmental pulmonary emboli without acute cor pulmonale (HCC) 06/08/2022   Acute deep vein thrombosis (DVT) of femoral vein of right lower extremity ([place]) 06/08/2022   Acquired hypothyroidism 06/08/2022    Slow transit constipation 06/08/2022   Spinal stenosis of thoracolumbar region 06/08/2022   Primary hypertension 06/08/2022   S/P left UKR 11/15/2012   Expected blood loss anemia 11/15/2012   Overweight (BMI 25.0-29.9) 11/15/2012     REFERRING PROVIDER: Burt Knack, PA-C  REFERRING DIAG:    Z98.1 (ICD-10-CM) - Arthrodesis status    Rationale for Evaluation and Treatment Rehabilitation  THERAPY DIAG:  Other low back pain  Difficulty in walking, not elsewhere classified  Muscle weakness (generalized)  ONSET DATE: approx early-mid 2022, surgery May 2023  SUBJECTIVE:  SUBJECTIVE STATEMENT: Pt reports she had a little relief with STM last session, "but I'm worse today".      From EVAL: I can't walk. My husband passed away last 10/11/2023- I was having difficulty walking and back pain before then but put it off. Apr 28, 2022 has back surgery. My back still aches and I cannot walk without a walker. Balance is okay but I am really careful. Less intense pain when I sit and increases with standing. Both legs feel abnormal , almost numb but can feel my feet when walking. I am in more pain since surgery. PT at [facility] after surgery. Noted more frequent urination but no loss of control.   PERTINENT HISTORY:   Bil partial TKA, h/o DVT  Diagnosis:  S/p T11-L4 reinstrumentation, T12-L1 through L2-3 SPO's [facility] 04/28/22) 2. Hx Trans psoas fusion at L3-4 followed by unilateral posterior instrumentation. Her imaging however shows that she has solid fusion of the L4-5 and L5-S1 levels. This could represent a congenital fusion but the overall appearance is more consistent with a previous fusion at those levels    PAIN:  Are you having pain? Yes: NPRS scale:4-5/10 Pain location: midline lower back Pain  description: constant with fluctuation in intensity Aggravating factors: standing/walking Relieving factors: sit/lay   PRECAUTIONS: None  WEIGHT BEARING RESTRICTIONS No  FALLS:  Has patient fallen in last 6 months? No  LIVING ENVIRONMENT:  Lives in: Other [facility] Has following equipment at home: Environmental consultant - 2 wheeled and Wheelchair (power)  OCCUPATION: retired PT  PLOF: Independent  PATIENT GOALS walk independently, decrease pain   OBJECTIVE:   PATIENT SURVEYS:  FOTO EVAL: 46 12/02/22: 35  MUSCLE LENGTH: EVAL: Unable to fully extend either knee in seated 12/02/22: Rt -5, Lt 0  POSTURE:  EVAL: post pelvic tilt with decreased lordosis 12/02/22: stands with hips shifted anteriorly and post balance of shoulders   LOWER EXTREMITY MMT:    MMT (age norm goal) Right eval Left eval Rt/Lt 12/7  Hip flexion (21) 19.5lb 16.7 21.6/17.4  Hip extension     Hip abduction     Hip adduction     Hip internal rotation     Hip external rotation     Knee flexion 10.7 9.2 16.1/17.6  Knee extension (46) 20.7 23.3 27.2/ 25.1  Ankle dorsiflexion     Ankle plantarflexion     Ankle inversion     Ankle eversion      (Blank rows = not tested)   FUNCTIONAL TESTS:  5 times sit to stand: EVAL: 14s 12/02/22: 16s  GAIT: Distance walked: to clinic and to<>from pool Assistive device utilized: Environmental consultant - 2 wheeled Level of assistance: Modified independence Comments: demonstrated heel toe pattern that is minimal but notable, flexed through thoracic spine, increased cadence    TODAY'S TREATMENT  Treatment                            12/02/22: Pt seen for aquatic therapy today.  Treatment took place in water 3.25-4.25 ft in depth at the [facility] pool. Temp of water was 92.  Pt entered/exited the pool via stairs using step-to pattern using LLE, with supervision, with hand  rail.  She is transported to/from pool.    * Holding black barbell:  walking forward (cues for heel  strike),backwards, and side stepping; cues for upright posture * Side to side lunge x 10each side  * hip flexor stretch x 20s each LE *  Heel raises x 10 * hip abdct/add x 10; hamstring curls x 10; hip ext x 10; squats x 10;  * staggered stance with forward/ backward weight shift and single arm row with resistance bell x 10 each (challenge!) * wall push up/ push offs, utilizing ankle strategy for reactive balance practice * Holding wall: single arm overhead reach - x 5 each; hip circles with straight knee x 10 x 2 each CW, CCW * at bench with feet on blue step:  STS with hip hinge and arm reach x 10 holding rainbow hand floats; cycling with hands on bench; bilat hip abdct/add x 20; R/L fig4 stretch holding ankle (challenge)  * holding barbell: weight shift and step forward, side, and back with return to neutral each time x 6 each * hamstring stretch with foot on 2nd step, hands on rails, attempting upright trunk (post lean)    Pt requires the buoyancy and hydrostatic pressure of water for support, and to offload joints by unweighting joint load by at least 50 % in navel deep water and by at least 75-80% in chest to neck deep water.  Viscosity of the water is needed for resistance of strengthening. Water current perturbations provides challenge to standing balance requiring increased core activation.   PATIENT EDUCATION:  Education details: Geophysicist/field seismologist of condition, POC, HEP, exercise form/rationale Person educated: Patient Education method: Explanation, Demonstration, Tactile cues, Verbal cues, and Handouts Education comprehension: verbalized understanding, returned demonstration, verbal cues required, tactile cues required, and needs further education   HOME EXERCISE PROGRAM: [id number]  ASSESSMENT:  CLINICAL IMPRESSION Pt continues with elevated pain level (5/10) which increases with hip ext and prolonged exercise in upright position. She gets relief when in relaxed squat position in water.    She participates well throughout despite her pain level. Encouraged pt to continue LE stretches at home and to use her RW when outside her living space (ie: dining hall, etc) to work on her standing tolerance. Progressing gradually towards LTGs.   Continues to stand with anterior translated hips resulting in a shortened posterior chain and will continue to work on this.   OBJECTIVE IMPAIRMENTS Abnormal gait, decreased activity tolerance, decreased strength, increased muscle spasms, impaired flexibility, impaired sensation, improper body mechanics, postural dysfunction, and pain.   ACTIVITY LIMITATIONS standing, squatting, stairs, transfers, and locomotion level  PARTICIPATION LIMITATIONS: meal prep, cleaning, laundry, shopping, and community activity  PERSONAL FACTORS Past/current experiences and Time since onset of injury/illness/exacerbation are also affecting patient's functional outcome.     GOALS: Goals reviewed with patient? Yes  SHORT TERM GOALS: Target date: 11/05/2022  Pt will find an appropriate level of intensity in aquatic exercise for strengthening Baseline:has  Goal status: Achieved - 11/28  2.  Pt will verbalize awareness of postural alignment in seated and standing through ADLs Baseline:  Goal status: Ongoing - 11/28   LONG TERM GOALS: Target date: 01/21/23  Pt will meet stated MMT goals in chart Baseline: see chart Goal status: INITIAL  2.  Pt will be able to ambulate household distances without AD and without increase in pain Baseline: relies on walker -11/28 Goal status: ongoing   3.  Average ain <=3/10 with ADLs Baseline: severe and limiting at eval Goal status: INITIAL  4.  Will tolerate community ambulation with AD less than walker Baseline: will progress as appropriate Goal status: INITIAL  PLAN: PT FREQUENCY: 2x/week  PT DURATION: other: 15 weeks  PLANNED INTERVENTIONS: Therapeutic exercises, Therapeutic activity, Neuromuscular re-education,  Balance training, Gait training,  Patient/Family education, Self Care, Joint mobilization, Stair training, Aquatic Therapy, Dry Needling, Electrical stimulation, Spinal mobilization, Cryotherapy, Moist heat, Taping, Manual therapy, and Re-evaluation.  PLAN FOR NEXT SESSION: Soft tissue massage to thoracolumbar paraspinals, hamstring flexibility, continue to increase heel strike in gait  Kerin Perna, PTA 12/07/22 2:58 PM [facility] [place], [place], [address] Phone: [phone]   Fax:  [phone]

## 2022-12-09 ENCOUNTER — Ambulatory Visit (HOSPITAL_BASED_OUTPATIENT_CLINIC_OR_DEPARTMENT_OTHER): Payer: Medicare Other | Admitting: Physical Therapy

## 2022-12-09 DIAGNOSIS — Z7901 Long term (current) use of anticoagulants: Secondary | ICD-10-CM | POA: Diagnosis not present

## 2022-12-09 DIAGNOSIS — Z86711 Personal history of pulmonary embolism: Secondary | ICD-10-CM | POA: Diagnosis not present

## 2022-12-09 DIAGNOSIS — Z87891 Personal history of nicotine dependence: Secondary | ICD-10-CM | POA: Diagnosis not present

## 2022-12-09 DIAGNOSIS — Z09 Encounter for follow-up examination after completed treatment for conditions other than malignant neoplasm: Secondary | ICD-10-CM | POA: Diagnosis not present

## 2022-12-09 DIAGNOSIS — Z86718 Personal history of other venous thrombosis and embolism: Secondary | ICD-10-CM | POA: Diagnosis not present

## 2022-12-14 ENCOUNTER — Ambulatory Visit (HOSPITAL_BASED_OUTPATIENT_CLINIC_OR_DEPARTMENT_OTHER): Payer: Medicare Other | Admitting: Physical Therapy

## 2022-12-16 ENCOUNTER — Ambulatory Visit (HOSPITAL_BASED_OUTPATIENT_CLINIC_OR_DEPARTMENT_OTHER): Payer: Medicare Other | Admitting: Physical Therapy

## 2022-12-21 ENCOUNTER — Ambulatory Visit (HOSPITAL_BASED_OUTPATIENT_CLINIC_OR_DEPARTMENT_OTHER): Payer: Medicare Other | Admitting: Physical Therapy

## 2022-12-23 ENCOUNTER — Encounter (HOSPITAL_BASED_OUTPATIENT_CLINIC_OR_DEPARTMENT_OTHER): Payer: Self-pay | Admitting: Physical Therapy

## 2022-12-23 ENCOUNTER — Ambulatory Visit (HOSPITAL_BASED_OUTPATIENT_CLINIC_OR_DEPARTMENT_OTHER): Payer: Medicare Other | Admitting: Physical Therapy

## 2022-12-23 DIAGNOSIS — M5459 Other low back pain: Secondary | ICD-10-CM

## 2022-12-23 DIAGNOSIS — M6281 Muscle weakness (generalized): Secondary | ICD-10-CM

## 2022-12-23 DIAGNOSIS — R262 Difficulty in walking, not elsewhere classified: Secondary | ICD-10-CM

## 2022-12-23 DIAGNOSIS — Z86711 Personal history of pulmonary embolism: Secondary | ICD-10-CM | POA: Diagnosis not present

## 2022-12-23 NOTE — Therapy (Signed)
OUTPATIENT PHYSICAL THERAPY THORACOLUMBAR TREATMENT   Patient Name: Stephanie Baker MRN: [id number] DOB:07-21-1941, 81 y.o., female Today's Date: 12/23/2022   PT End of Session - 12/23/22 1408     Visit Number 11    Number of Visits 30    Date for PT Re-Evaluation 01/21/23    Authorization Type MCR    Progress Note Due on Visit 19    PT Start Time 1400    PT Stop Time 1439    PT Time Calculation (min) 39 min    Activity Tolerance Patient tolerated treatment well    Behavior During Therapy WFL for tasks assessed/performed              Past Medical History:  Diagnosis Date   Arthritis    OA   GERD (gastroesophageal reflux disease)    Hematuria    idiopathic- [facility]   Hyperlipemia    Hypertension    Hypothyroidism    Past Surgical History:  Procedure Laterality Date   ABDOMINAL HYSTERECTOMY     APPENDECTOMY     BACK SURGERY     lumbar   EYE SURGERY     bilateral cataract extraction with IOL   GASTROC RECESSION EXTREMITY Left 02/23/2018   Procedure: Left Gastroc Recession;  Surgeon: Wylene Simmer, MD;  Location: [facility];  Service: Orthopedics;  Laterality: Left;   HAND SURGERY     left thumb MP   JOINT REPLACEMENT     right knee   PARTIAL KNEE ARTHROPLASTY  11/14/2012   Procedure: UNICOMPARTMENTAL KNEE;  Surgeon: Mauri Pole, MD;  Location: [facility];  Service: Orthopedics;  Laterality: Left;   TARSAL METATARSAL ARTHRODESIS Left 02/23/2018   Procedure: Left First and Second Tarsometatarsal Arthrodesis;  Surgeon: Wylene Simmer, MD;  Location: [facility];  Service: Orthopedics;  Laterality: Left;   thumb surgery     left   VAGINAL HYSTERECTOMY     Patient Active Problem List   Diagnosis Date Noted   Multiple subsegmental pulmonary emboli without acute cor pulmonale ([place]) 06/08/2022   Acute deep vein thrombosis (DVT) of femoral vein of right lower extremity ([place]) 06/08/2022   Acquired hypothyroidism 06/08/2022    Slow transit constipation 06/08/2022   Spinal stenosis of thoracolumbar region 06/08/2022   Primary hypertension 06/08/2022   S/P left UKR 11/15/2012   Expected blood loss anemia 11/15/2012   Overweight (BMI 25.0-29.9) 11/15/2012     REFERRING PROVIDER: Burt Knack, PA-C  REFERRING DIAG:    Z98.1 (ICD-10-CM) - Arthrodesis status    Rationale for Evaluation and Treatment Rehabilitation  THERAPY DIAG:  Other low back pain  Muscle weakness (generalized)  Difficulty in walking, not elsewhere classified  ONSET DATE: approx early-mid 2022, surgery May 2023  SUBJECTIVE:  SUBJECTIVE STATEMENT: Pt reports she had a fall at home 2 weeks ago.  She is observed with a bruise on front of Lt shin (skin is intact/closed). "I think that fall set me back." "I haven't really exercised over last few weeks due to the holidays".     From EVAL: I can't walk. My husband passed away last 04-Nov-2023- I was having difficulty walking and back pain before then but put it off. Apr 28, 2022 has back surgery. My back still aches and I cannot walk without a walker. Balance is okay but I am really careful. Less intense pain when I sit and increases with standing. Both legs feel abnormal , almost numb but can feel my feet when walking. I am in more pain since surgery. PT at [facility] after surgery. Noted more frequent urination but no loss of control.   PERTINENT HISTORY:   Bil partial TKA, h/o DVT  Diagnosis:  S/p T11-L4 reinstrumentation, T12-L1 through L2-3 SPO's [facility] 04/28/22) 2. Hx Trans psoas fusion at L3-4 followed by unilateral posterior instrumentation. Her imaging however shows that she has solid fusion of the L4-5 and L5-S1 levels. This could represent a congenital fusion but the overall appearance is more  consistent with a previous fusion at those levels    PAIN:  Are you having pain? Yes: NPRS scale:6/10 Pain location: midline lower back Pain description: constant with fluctuation in intensity Aggravating factors: standing/walking Relieving factors: sit/lay   PRECAUTIONS: None  WEIGHT BEARING RESTRICTIONS No  FALLS:  Has patient fallen in last 6 months? No  LIVING ENVIRONMENT:  Lives in: Other [facility] Has following equipment at home: Environmental consultant - 2 wheeled and Wheelchair (power)  OCCUPATION: retired PT  PLOF: Independent  PATIENT GOALS walk independently, decrease pain   OBJECTIVE:   PATIENT SURVEYS:  FOTO EVAL: 46 12/02/22: 35  MUSCLE LENGTH: EVAL: Unable to fully extend either knee in seated 12/02/22: Rt -5, Lt 0  POSTURE:  EVAL: post pelvic tilt with decreased lordosis 12/02/22: stands with hips shifted anteriorly and post balance of shoulders   LOWER EXTREMITY MMT:    MMT (age norm goal) Right eval Left eval Rt/Lt 12/7  Hip flexion (21) 19.5lb 16.7 21.6/17.4  Hip extension     Hip abduction     Hip adduction     Hip internal rotation     Hip external rotation     Knee flexion 10.7 9.2 16.1/17.6  Knee extension (46) 20.7 23.3 27.2/ 25.1  Ankle dorsiflexion     Ankle plantarflexion     Ankle inversion     Ankle eversion      (Blank rows = not tested)   FUNCTIONAL TESTS:  5 times sit to stand: EVAL: 14s 12/02/22: 16s  GAIT: Distance walked: to clinic and to<>from pool Assistive device utilized: Environmental consultant - 2 wheeled Level of assistance: Modified independence Comments: demonstrated heel toe pattern that is minimal but notable, flexed through thoracic spine, increased cadence    TODAY'S TREATMENT  Treatment                            12/23/22: Pt seen for aquatic therapy today.  Treatment took place in water 3.25-4.25 ft in depth at the [facility] pool. Temp of water was 92.  Pt entered/exited the pool via stairs using step-to pattern  using LLE, with supervision, with hand  rail.  She is transported to/from pool.    * Holding white barbell:  walking  forward (cues for pause in toe off for hip flexor stretch); backwards, and side stepping;  * holding wall:  Heel raises x 10; Side to side lunge x 10 each side; alternating hamstring curls x 10; hip abdct/add x 10 * wall push up/ push offs, utilizing ankle strategy for reactive balance practice * holding white barbell: forward step and return to neutral x 5 x 2; side weight shift and return to neutral x 5 x 2 * sitting on bench in water, blue step under feet: single overhead reach x 5; cycling with LEs;  diagonals holding small yellow ball (wood chop motion) x 5 each; TrA set submerging yellow ball with 2 hands; repeated cycling; repeated diagonals x 5 each side; STS with  UE on white barbell x 10 * return to deeper water with white barbell:  row motion x 10 facing outside/ x 10 facing benches; walking forward/ backward; marching forward/ backward; side stepping * hamstring stretch with foot on 2nd step, hands on rails   Pt requires the buoyancy and hydrostatic pressure of water for support, and to offload joints by unweighting joint load by at least 50 % in navel deep water and by at least 75-80% in chest to neck deep water.  Viscosity of the water is needed for resistance of strengthening. Water current perturbations provides challenge to standing balance requiring increased core activation.   PATIENT EDUCATION:  Education details: aquatic exercise form/rationale Person educated: Patient Education method: Explanation, Demonstration, Tactile cues, Verbal cues, and Handouts Education comprehension: verbalized understanding, returned demonstration, verbal cues required, tactile cues required, and needs further education   HOME EXERCISE PROGRAM: [id number]  ASSESSMENT:  CLINICAL IMPRESSION Pt completed exercises with less required rest breaks in squat position.  Pain remained  unchanged (no worse/ no better) during session.  Participated well throughout and remains motivated to progress towards LTGs.  Progressing gradually.     OBJECTIVE IMPAIRMENTS Abnormal gait, decreased activity tolerance, decreased strength, increased muscle spasms, impaired flexibility, impaired sensation, improper body mechanics, postural dysfunction, and pain.   ACTIVITY LIMITATIONS standing, squatting, stairs, transfers, and locomotion level  PARTICIPATION LIMITATIONS: meal prep, cleaning, laundry, shopping, and community activity  PERSONAL FACTORS Past/current experiences and Time since onset of injury/illness/exacerbation are also affecting patient's functional outcome.     GOALS: Goals reviewed with patient? Yes  SHORT TERM GOALS: Target date: 11/05/2022  Pt will find an appropriate level of intensity in aquatic exercise for strengthening Baseline:has  Goal status: Achieved - 11/28  2.  Pt will verbalize awareness of postural alignment in seated and standing through ADLs Baseline:  Goal status: Ongoing - 11/28   LONG TERM GOALS: Target date: 01/21/23  Pt will meet stated MMT goals in chart Baseline: see chart Goal status: INITIAL  2.  Pt will be able to ambulate household distances without AD and without increase in pain Baseline: relies on walker -11/28 Goal status: ongoing   3.  Average ain <=3/10 with ADLs Baseline: severe and limiting at eval Goal status: INITIAL  4.  Will tolerate community ambulation with AD less than walker Baseline: will progress as appropriate Goal status: INITIAL  PLAN: PT FREQUENCY: 2x/week  PT DURATION: other: 15 weeks  PLANNED INTERVENTIONS: Therapeutic exercises, Therapeutic activity, Neuromuscular re-education, Balance training, Gait training, Patient/Family education, Self Care, Joint mobilization, Stair training, Aquatic Therapy, Dry Needling, Electrical stimulation, Spinal mobilization, Cryotherapy, Moist heat, Taping, Manual  therapy, and Re-evaluation.  PLAN FOR NEXT SESSION: Soft tissue massage to thoracolumbar paraspinals, hamstring flexibility, continue to  increase heel strike in gait   Kerin Perna, PTA 12/23/22 2:40 PM [facility] [place], [place], [address] Phone: [phone]   Fax:  [phone]

## 2022-12-28 ENCOUNTER — Encounter (HOSPITAL_BASED_OUTPATIENT_CLINIC_OR_DEPARTMENT_OTHER): Payer: Self-pay | Admitting: Physical Therapy

## 2022-12-28 ENCOUNTER — Ambulatory Visit (HOSPITAL_BASED_OUTPATIENT_CLINIC_OR_DEPARTMENT_OTHER): Payer: Medicare Other | Attending: Orthopedic Surgery | Admitting: Physical Therapy

## 2022-12-28 DIAGNOSIS — R262 Difficulty in walking, not elsewhere classified: Secondary | ICD-10-CM | POA: Insufficient documentation

## 2022-12-28 DIAGNOSIS — M5459 Other low back pain: Secondary | ICD-10-CM | POA: Insufficient documentation

## 2022-12-28 DIAGNOSIS — M6281 Muscle weakness (generalized): Secondary | ICD-10-CM | POA: Insufficient documentation

## 2022-12-28 NOTE — Therapy (Signed)
OUTPATIENT PHYSICAL THERAPY THORACOLUMBAR TREATMENT   Patient Name: Stephanie Baker MRN: [id number] DOB:02-29-1941, 82 y.o., female Today's Date: 12/28/2022   PT End of Session - 12/28/22 1407     Visit Number 12    Number of Visits 30    Date for PT Re-Evaluation 01/21/23    Authorization Type MCR    Progress Note Due on Visit 19    PT Start Time 1400    PT Stop Time 1442    PT Time Calculation (min) 42 min    Activity Tolerance Patient tolerated treatment well    Behavior During Therapy WFL for tasks assessed/performed              Past Medical History:  Diagnosis Date   Arthritis    OA   GERD (gastroesophageal reflux disease)    Hematuria    idiopathic- [facility]   Hyperlipemia    Hypertension    Hypothyroidism    Past Surgical History:  Procedure Laterality Date   ABDOMINAL HYSTERECTOMY     APPENDECTOMY     BACK SURGERY     lumbar   EYE SURGERY     bilateral cataract extraction with IOL   GASTROC RECESSION EXTREMITY Left 02/23/2018   Procedure: Left Gastroc Recession;  Surgeon: Wylene Simmer, MD;  Location: [facility];  Service: Orthopedics;  Laterality: Left;   HAND SURGERY     left thumb MP   JOINT REPLACEMENT     right knee   PARTIAL KNEE ARTHROPLASTY  11/14/2012   Procedure: UNICOMPARTMENTAL KNEE;  Surgeon: Mauri Pole, MD;  Location: [facility];  Service: Orthopedics;  Laterality: Left;   TARSAL METATARSAL ARTHRODESIS Left 02/23/2018   Procedure: Left First and Second Tarsometatarsal Arthrodesis;  Surgeon: Wylene Simmer, MD;  Location: [facility];  Service: Orthopedics;  Laterality: Left;   thumb surgery     left   VAGINAL HYSTERECTOMY     Patient Active Problem List   Diagnosis Date Noted   Multiple subsegmental pulmonary emboli without acute cor pulmonale ([place]) 06/08/2022   Acute deep vein thrombosis (DVT) of femoral vein of right lower extremity ([place]) 06/08/2022   Acquired hypothyroidism 06/08/2022    Slow transit constipation 06/08/2022   Spinal stenosis of thoracolumbar region 06/08/2022   Primary hypertension 06/08/2022   S/P left UKR 11/15/2012   Expected blood loss anemia 11/15/2012   Overweight (BMI 25.0-29.9) 11/15/2012     REFERRING PROVIDER: Burt Knack, PA-C  REFERRING DIAG:    Z98.1 (ICD-10-CM) - Arthrodesis status    Rationale for Evaluation and Treatment Rehabilitation  THERAPY DIAG:  Other low back pain  Muscle weakness (generalized)  Difficulty in walking, not elsewhere classified  ONSET DATE: approx early-mid 2022, surgery May 2023  SUBJECTIVE:  SUBJECTIVE STATEMENT: "I feel about the same".   From EVAL: I can't walk. My husband passed away last 10-17-23- I was having difficulty walking and back pain before then but put it off. Apr 28, 2022 has back surgery. My back still aches and I cannot walk without a walker. Balance is okay but I am really careful. Less intense pain when I sit and increases with standing. Both legs feel abnormal , almost numb but can feel my feet when walking. I am in more pain since surgery. PT at [facility] after surgery. Noted more frequent urination but no loss of control.   PERTINENT HISTORY:   Bil partial TKA, h/o DVT  Diagnosis:  S/p T11-L4 reinstrumentation, T12-L1 through L2-3 SPO's [facility] 04/28/22) 2. Hx Trans psoas fusion at L3-4 followed by unilateral posterior instrumentation. Her imaging however shows that she has solid fusion of the L4-5 and L5-S1 levels. This could represent a congenital fusion but the overall appearance is more consistent with a previous fusion at those levels    PAIN:  Are you having pain? Yes: NPRS scale:5-6/10 Pain location: midline lower back Pain description: constant with fluctuation in intensity Aggravating  factors: standing/walking Relieving factors: sit/lay   PRECAUTIONS: None  WEIGHT BEARING RESTRICTIONS No  FALLS:  Has patient fallen in last 6 months? No  LIVING ENVIRONMENT:  Lives in: Other [facility] Has following equipment at home: Environmental consultant - 2 wheeled and Wheelchair (power)  OCCUPATION: retired PT  PLOF: Independent  PATIENT GOALS walk independently, decrease pain   OBJECTIVE:   PATIENT SURVEYS:  FOTO EVAL: 46 12/02/22: 35  MUSCLE LENGTH: EVAL: Unable to fully extend either knee in seated 12/02/22: Rt -5, Lt 0  POSTURE:  EVAL: post pelvic tilt with decreased lordosis 12/02/22: stands with hips shifted anteriorly and post balance of shoulders   LOWER EXTREMITY MMT:    MMT (age norm goal) Right eval Left eval Rt/Lt 12/7  Hip flexion (21) 19.5lb 16.7 21.6/17.4  Hip extension     Hip abduction     Hip adduction     Hip internal rotation     Hip external rotation     Knee flexion 10.7 9.2 16.1/17.6  Knee extension (46) 20.7 23.3 27.2/ 25.1  Ankle dorsiflexion     Ankle plantarflexion     Ankle inversion     Ankle eversion      (Blank rows = not tested)   FUNCTIONAL TESTS:  5 times sit to stand: EVAL: 14s 12/02/22: 16s  GAIT: Distance walked: to clinic and to<>from pool Assistive device utilized: Environmental consultant - 2 wheeled Level of assistance: Modified independence Comments: demonstrated heel toe pattern that is minimal but notable, flexed through thoracic spine, increased cadence    TODAY'S TREATMENT  Treatment                            12/28/22: Pt seen for aquatic therapy today.  Treatment took place in water 3.25-4.25 ft in depth at the [facility] pool. Temp of water was 92.  Pt entered/exited the pool via stairs using step-to pattern using LLE, with supervision, with hand  rail.  She is transported to/from pool.    * Holding white barbell:  walking forward ; backwards, and side stepping; [place] L/R (challenge) * holding wall: hip abdct/  addct x 5 x 2; hip ext toe tap x x 5 x 2;  Heel raises x 10; alternating hip openers (hip abdct/knee flexion ) x 6  each; Side to side lunge x 10 each side;  * wall push up/ push offs, utilizing ankle strategy and hip wall bumps for reactive balance practice * holding white barbell: forward step and return to neutral x 5; side weight shift and return to neutral x 5 * sitting on bench in water, blue step under feet: single overhead reach x 3; wood chop diagonals holding small yellow ball (wood chop motion) x 7 each; TrA set submerging yellow ball with 2 hands; STS with  UE on white barbell x 10 * alternating toe taps to blue step with unilateral support on wall (unable to complete without UE support) * return to deeper water with white barbell:  row motion x 10  walking forward/ backward; marching forward/ backward;  * hamstring stretch with foot on 2nd step, 20s x 2 each, hands on rails   Pt requires the buoyancy and hydrostatic pressure of water for support, and to offload joints by unweighting joint load by at least 50 % in navel deep water and by at least 75-80% in chest to neck deep water.  Viscosity of the water is needed for resistance of strengthening. Water current perturbations provides challenge to standing balance requiring increased core activation.   PATIENT EDUCATION:  Education details: aquatic exercise form/rationale Person educated: Patient Education method: Consulting civil engineer, Demonstration, Tactile cues, Verbal cues, and Handouts Education comprehension: verbalized understanding, returned demonstration, verbal cues required, tactile cues required, and needs further education   HOME EXERCISE PROGRAM: [id number]  ASSESSMENT:  CLINICAL IMPRESSION Pt's Rt knee appears straighter in stance phase today; some cues during session to extend knee for improved balance in SLS. Pt completed exercises with less required rest breaks in squat position.  Pain remained unchanged (no worse/ no better)  during session.  Participated well throughout and remains motivated to progress towards LTGs.  Progressing gradually.     OBJECTIVE IMPAIRMENTS Abnormal gait, decreased activity tolerance, decreased strength, increased muscle spasms, impaired flexibility, impaired sensation, improper body mechanics, postural dysfunction, and pain.   ACTIVITY LIMITATIONS standing, squatting, stairs, transfers, and locomotion level  PARTICIPATION LIMITATIONS: meal prep, cleaning, laundry, shopping, and community activity  PERSONAL FACTORS Past/current experiences and Time since onset of injury/illness/exacerbation are also affecting patient's functional outcome.     GOALS: Goals reviewed with patient? Yes  SHORT TERM GOALS: Target date: 11/05/2022  Pt will find an appropriate level of intensity in aquatic exercise for strengthening Baseline:has  Goal status: Achieved - 11/28  2.  Pt will verbalize awareness of postural alignment in seated and standing through ADLs Baseline:  Goal status: Ongoing - 11/28   LONG TERM GOALS: Target date: 01/21/23  Pt will meet stated MMT goals in chart Baseline: see chart Goal status: INITIAL  2.  Pt will be able to ambulate household distances without AD and without increase in pain Baseline: relies on walker -12/28/22 Goal status: ongoing   3.  Average ain <=3/10 with ADLs Baseline: severe and limiting at eval; 5-6/10 currently Goal status: Ongoing -12/28/22  4.  Will tolerate community ambulation with AD less than walker Baseline: pt uses WC for community; RW in household  Goal status: Ongoing - 12/28/22  PLAN: PT FREQUENCY: 2x/week  PT DURATION: other: 15 weeks  PLANNED INTERVENTIONS: Therapeutic exercises, Therapeutic activity, Neuromuscular re-education, Balance training, Gait training, Patient/Family education, Self Care, Joint mobilization, Stair training, Aquatic Therapy, Dry Needling, Electrical stimulation, Spinal mobilization, Cryotherapy, Moist  heat, Taping, Manual therapy, and Re-evaluation.  PLAN FOR NEXT SESSION: Soft tissue massage to  thoracolumbar paraspinals, hamstring flexibility, continue to increase heel strike in gait  Kerin Perna, PTA 12/28/22 4:46 PM [facility] [place], [place], [address] Phone: [phone]   Fax:  [phone]

## 2022-12-30 ENCOUNTER — Encounter (HOSPITAL_BASED_OUTPATIENT_CLINIC_OR_DEPARTMENT_OTHER): Payer: Self-pay | Admitting: Physical Therapy

## 2022-12-30 ENCOUNTER — Ambulatory Visit (HOSPITAL_BASED_OUTPATIENT_CLINIC_OR_DEPARTMENT_OTHER): Payer: Medicare Other | Admitting: Physical Therapy

## 2022-12-30 DIAGNOSIS — M5459 Other low back pain: Secondary | ICD-10-CM

## 2022-12-30 DIAGNOSIS — R262 Difficulty in walking, not elsewhere classified: Secondary | ICD-10-CM

## 2022-12-30 DIAGNOSIS — M6281 Muscle weakness (generalized): Secondary | ICD-10-CM | POA: Diagnosis not present

## 2022-12-30 NOTE — Therapy (Signed)
OUTPATIENT PHYSICAL THERAPY THORACOLUMBAR TREATMENT   Patient Name: Stephanie Baker MRN: [id number] DOB:1941-12-16, 82 y.o., female Today's Date: 12/30/2022   PT End of Session - 12/30/22 1405     Visit Number 13    Number of Visits 30    Date for PT Re-Evaluation 01/21/23    Authorization Type MCR    Progress Note Due on Visit 19    PT Start Time 1400    PT Stop Time 1441    PT Time Calculation (min) 41 min    Activity Tolerance Patient tolerated treatment well    Behavior During Therapy WFL for tasks assessed/performed              Past Medical History:  Diagnosis Date   Arthritis    OA   GERD (gastroesophageal reflux disease)    Hematuria    idiopathic- [facility]   Hyperlipemia    Hypertension    Hypothyroidism    Past Surgical History:  Procedure Laterality Date   ABDOMINAL HYSTERECTOMY     APPENDECTOMY     BACK SURGERY     lumbar   EYE SURGERY     bilateral cataract extraction with IOL   GASTROC RECESSION EXTREMITY Left 02/23/2018   Procedure: Left Gastroc Recession;  Surgeon: Wylene Simmer, MD;  Location: [facility];  Service: Orthopedics;  Laterality: Left;   HAND SURGERY     left thumb MP   JOINT REPLACEMENT     right knee   PARTIAL KNEE ARTHROPLASTY  11/14/2012   Procedure: UNICOMPARTMENTAL KNEE;  Surgeon: Mauri Pole, MD;  Location: [facility];  Service: Orthopedics;  Laterality: Left;   TARSAL METATARSAL ARTHRODESIS Left 02/23/2018   Procedure: Left First and Second Tarsometatarsal Arthrodesis;  Surgeon: Wylene Simmer, MD;  Location: [facility];  Service: Orthopedics;  Laterality: Left;   thumb surgery     left   VAGINAL HYSTERECTOMY     Patient Active Problem List   Diagnosis Date Noted   Multiple subsegmental pulmonary emboli without acute cor pulmonale ([place]) 06/08/2022   Acute deep vein thrombosis (DVT) of femoral vein of right lower extremity ([place]) 06/08/2022   Acquired hypothyroidism 06/08/2022    Slow transit constipation 06/08/2022   Spinal stenosis of thoracolumbar region 06/08/2022   Primary hypertension 06/08/2022   S/P left UKR 11/15/2012   Expected blood loss anemia 11/15/2012   Overweight (BMI 25.0-29.9) 11/15/2012     REFERRING PROVIDER: Burt Knack, PA-C  REFERRING DIAG:    Z98.1 (ICD-10-CM) - Arthrodesis status    Rationale for Evaluation and Treatment Rehabilitation  THERAPY DIAG:  Other low back pain  Muscle weakness (generalized)  Difficulty in walking, not elsewhere classified  ONSET DATE: approx early-mid 2022, surgery May 2023  SUBJECTIVE:  SUBJECTIVE STATEMENT: "I think I'm getting a little stronger, a little more mobile."  "I've been working on getting my Rt leg straighter"  From EVAL: I can't walk. My husband passed away last 10/11/2023- I was having difficulty walking and back pain before then but put it off. Apr 28, 2022 has back surgery. My back still aches and I cannot walk without a walker. Balance is okay but I am really careful. Less intense pain when I sit and increases with standing. Both legs feel abnormal , almost numb but can feel my feet when walking. I am in more pain since surgery. PT at [facility] after surgery. Noted more frequent urination but no loss of control.   PERTINENT HISTORY:   Bil partial TKA, h/o DVT  Diagnosis:  S/p T11-L4 reinstrumentation, T12-L1 through L2-3 SPO's [facility] 04/28/22) 2. Hx Trans psoas fusion at L3-4 followed by unilateral posterior instrumentation. Her imaging however shows that she has solid fusion of the L4-5 and L5-S1 levels. This could represent a congenital fusion but the overall appearance is more consistent with a previous fusion at those levels    PAIN:  Are you having pain? Yes: NPRS scale:5-6/10 Pain location:  midline lower back Pain description: constant with fluctuation in intensity Aggravating factors: standing/walking Relieving factors: sit/lay   PRECAUTIONS: None  WEIGHT BEARING RESTRICTIONS No  FALLS:  Has patient fallen in last 6 months? No  LIVING ENVIRONMENT:  Lives in: Other [facility] Has following equipment at home: Environmental consultant - 2 wheeled and Wheelchair (power)  OCCUPATION: retired PT  PLOF: Independent  PATIENT GOALS walk independently, decrease pain   OBJECTIVE:   PATIENT SURVEYS:  FOTO EVAL: 46 12/02/22: 35  MUSCLE LENGTH: EVAL: Unable to fully extend either knee in seated 12/02/22: Rt -5, Lt 0  POSTURE:  EVAL: post pelvic tilt with decreased lordosis 12/02/22: stands with hips shifted anteriorly and post balance of shoulders   LOWER EXTREMITY MMT:    MMT (age norm goal) Right eval Left eval Rt/Lt 12/7  Hip flexion (21) 19.5lb 16.7 21.6/17.4  Hip extension     Hip abduction     Hip adduction     Hip internal rotation     Hip external rotation     Knee flexion 10.7 9.2 16.1/17.6  Knee extension (46) 20.7 23.3 27.2/ 25.1  Ankle dorsiflexion     Ankle plantarflexion     Ankle inversion     Ankle eversion      (Blank rows = not tested)   FUNCTIONAL TESTS:  5 times sit to stand: EVAL: 14s 12/02/22: 16s  GAIT: Distance walked: to clinic and to<>from pool Assistive device utilized: Environmental consultant - 2 wheeled Level of assistance: Modified independence Comments: demonstrated heel toe pattern that is minimal but notable, flexed through thoracic spine, increased cadence    TODAY'S TREATMENT  Treatment                            12/28/22: Pt seen for aquatic therapy today.  Treatment took place in water 3.25-4.25 ft in depth at the [facility] pool. Temp of water was 92.  Pt entered/exited the pool via stairs using step-to pattern using LLE, with supervision, with hand  rail.  She is transported to/from pool.    * Holding white barbell:  walking  forward ; backwards, side stepping; grapevine L/R (challenge); marching forward * holding wall: single arm overhead reach x 4 each; hip abdct/ addct x 5 x 2;  hip ext toe tap x x 5 x 2;  Heel raises x 10; alternating hip openers (hip abdct/knee flexion ) x 6 each; Side to side lunge x 10 each side;  * wall push up/ push offs 10, utilizing ankle strategy and hip wall bumps  x 10 for reactive balance practice * at wall but not holding on: side weight shift with reach and twist x 5 each, with reach towards ceiling x 5 each  * holding white barbell: forward step and return to neutral x 10; side weight shift and return to neutral x 5; high knee marching forward; 3 way toe tap x 5 each LE * sitting on bench in water, blue step under feet:  STS without support x 10; wood chop diagonals holding small yellow ball (wood chop motion) x 5 each x 2; TrA set submerging yellow ball with 2 hands x 5 * forward step ups with RLE, retro step down with LLE, LUE on wall x 10 * hamstring stretch with foot on 2nd step, 15s x 2 each, hands on rails; bilat gastroc with heels off of step; fig 4 x 10s each * return to deeper water with white barbell: walking forward for recovery   Pt requires the buoyancy and hydrostatic pressure of water for support, and to offload joints by unweighting joint load by at least 50 % in navel deep water and by at least 75-80% in chest to neck deep water.  Viscosity of the water is needed for resistance of strengthening. Water current perturbations provides challenge to standing balance requiring increased core activation.   PATIENT EDUCATION:  Education details: aquatic exercise form/rationale Person educated: Patient Education method: Explanation, Demonstration, Tactile cues, Verbal cues, and Handouts Education comprehension: verbalized understanding, returned demonstration, verbal cues required, tactile cues required, and needs further education   HOME EXERCISE  PROGRAM: [id number]  ASSESSMENT:  CLINICAL IMPRESSION Pt didn't require any rest breaks today.  She reported her pain level remained unchanged throughout session, and never increased. She was able to complete additional reaching exercises with less UE support and moving outside BOS and overhead.   Participated well throughout and remains motivated to progress towards LTGs.  Progressing gradually.     OBJECTIVE IMPAIRMENTS Abnormal gait, decreased activity tolerance, decreased strength, increased muscle spasms, impaired flexibility, impaired sensation, improper body mechanics, postural dysfunction, and pain.   ACTIVITY LIMITATIONS standing, squatting, stairs, transfers, and locomotion level  PARTICIPATION LIMITATIONS: meal prep, cleaning, laundry, shopping, and community activity  PERSONAL FACTORS Past/current experiences and Time since onset of injury/illness/exacerbation are also affecting patient's functional outcome.     GOALS: Goals reviewed with patient? Yes  SHORT TERM GOALS: Target date: 11/05/2022  Pt will find an appropriate level of intensity in aquatic exercise for strengthening Baseline:has  Goal status: Achieved - 11/28  2.  Pt will verbalize awareness of postural alignment in seated and standing through ADLs Baseline:  Goal status: Ongoing - 11/28   LONG TERM GOALS: Target date: 01/21/23  Pt will meet stated MMT goals in chart Baseline: see chart Goal status: INITIAL  2.  Pt will be able to ambulate household distances without AD and without increase in pain Baseline: relies on walker -12/28/22 Goal status: ongoing   3.  Average ain <=3/10 with ADLs Baseline: severe and limiting at eval; 5-6/10 currently Goal status: Ongoing -12/28/22  4.  Will tolerate community ambulation with AD less than walker Baseline: pt uses WC for community; RW in household  Goal status: Ongoing - 12/28/22  PLAN: PT FREQUENCY: 2x/week  PT DURATION: other: 15 weeks  PLANNED  INTERVENTIONS: Therapeutic exercises, Therapeutic activity, Neuromuscular re-education, Balance training, Gait training, Patient/Family education, Self Care, Joint mobilization, Stair training, Aquatic Therapy, Dry Needling, Electrical stimulation, Spinal mobilization, Cryotherapy, Moist heat, Taping, Manual therapy, and Re-evaluation.  PLAN FOR NEXT SESSION: Land appt: Soft tissue massage to thoracolumbar paraspinals, hamstring flexibility, continue to increase heel strike in gait; continue aquatics   Kerin Perna, PTA 12/30/22 2:47 PM [facility] [place], [place], [address] Phone: [phone]   Fax:  [phone]

## 2023-01-04 ENCOUNTER — Ambulatory Visit (HOSPITAL_BASED_OUTPATIENT_CLINIC_OR_DEPARTMENT_OTHER): Payer: Medicare Other | Admitting: Physical Therapy

## 2023-01-06 ENCOUNTER — Encounter (HOSPITAL_BASED_OUTPATIENT_CLINIC_OR_DEPARTMENT_OTHER): Payer: Self-pay | Admitting: Physical Therapy

## 2023-01-06 ENCOUNTER — Ambulatory Visit (HOSPITAL_BASED_OUTPATIENT_CLINIC_OR_DEPARTMENT_OTHER): Payer: Medicare Other | Admitting: Physical Therapy

## 2023-01-06 DIAGNOSIS — R262 Difficulty in walking, not elsewhere classified: Secondary | ICD-10-CM

## 2023-01-06 DIAGNOSIS — M5459 Other low back pain: Secondary | ICD-10-CM | POA: Diagnosis not present

## 2023-01-06 DIAGNOSIS — M6281 Muscle weakness (generalized): Secondary | ICD-10-CM

## 2023-01-06 NOTE — Therapy (Signed)
OUTPATIENT PHYSICAL THERAPY THORACOLUMBAR TREATMENT   Patient Name: Stephanie Baker MRN: [id number] DOB:[birth year], 82 y.o., female Today's Date: 01/06/2023   PT End of Session - 01/06/23 1433     Visit Number 14    Number of Visits 30    Date for PT Re-Evaluation 02/10/23    Authorization Type MCR    Progress Note Due on Visit 71    PT Start Time 1430    PT Stop Time 1511    PT Time Calculation (min) 41 min    Activity Tolerance Patient tolerated treatment well    Behavior During Therapy WFL for tasks assessed/performed              Past Medical History:  Diagnosis Date   Arthritis    OA   GERD (gastroesophageal reflux disease)    Hematuria    idiopathic- [facility]   Hyperlipemia    Hypertension    Hypothyroidism    Past Surgical History:  Procedure Laterality Date   ABDOMINAL HYSTERECTOMY     APPENDECTOMY     BACK SURGERY     lumbar   EYE SURGERY     bilateral cataract extraction with IOL   GASTROC RECESSION EXTREMITY Left 02/23/2018   Procedure: Left Gastroc Recession;  Surgeon: Wylene Simmer, MD;  Location: [facility];  Service: Orthopedics;  Laterality: Left;   HAND SURGERY     left thumb MP   JOINT REPLACEMENT     right knee   PARTIAL KNEE ARTHROPLASTY  11/14/2012   Procedure: UNICOMPARTMENTAL KNEE;  Surgeon: Mauri Pole, MD;  Location: [facility];  Service: Orthopedics;  Laterality: Left;   TARSAL METATARSAL ARTHRODESIS Left 02/23/2018   Procedure: Left First and Second Tarsometatarsal Arthrodesis;  Surgeon: Wylene Simmer, MD;  Location: [facility];  Service: Orthopedics;  Laterality: Left;   thumb surgery     left   VAGINAL HYSTERECTOMY     Patient Active Problem List   Diagnosis Date Noted   Multiple subsegmental pulmonary emboli without acute cor pulmonale ([place]) 06/08/2022   Acute deep vein thrombosis (DVT) of femoral vein of right lower extremity ([place]) 06/08/2022   Acquired hypothyroidism 06/08/2022    Slow transit constipation 06/08/2022   Spinal stenosis of thoracolumbar region 06/08/2022   Primary hypertension 06/08/2022   S/P left UKR 11/15/2012   Expected blood loss anemia 11/15/2012   Overweight (BMI 25.0-29.9) 11/15/2012     REFERRING PROVIDER: Burt Knack, PA-C  REFERRING DIAG:    Z98.1 (ICD-10-CM) - Arthrodesis status    Rationale for Evaluation and Treatment Rehabilitation  THERAPY DIAG:  Other low back pain  Muscle weakness (generalized)  Difficulty in walking, not elsewhere classified  ONSET DATE: approx early-mid 2022, surgery May 2023  SUBJECTIVE:  SUBJECTIVE STATEMENT: My back pain is not any better. N/T into lets that comes and goes, a little bit constant. My balance I think is pretty good as a whole but not better since starting PT. Goes to gym at [employer] daily to ride nustep 20 min. I can transfer easier but still using my RW everywhere I walk.   From EVAL: I can't walk. My husband passed away last 10-10-23- I was having difficulty walking and back pain before then but put it off. Apr 28, 2022 has back surgery. My back still aches and I cannot walk without a walker. Balance is okay but I am really careful. Less intense pain when I sit and increases with standing. Both legs feel abnormal , almost numb but can feel my feet when walking. I am in more pain since surgery. PT at [facility] after surgery. Noted more frequent urination but no loss of control.   PERTINENT HISTORY:   Bil partial TKA, h/o DVT  Diagnosis:  S/p T11-L4 reinstrumentation, T12-L1 through L2-3 SPO's [facility] 04/28/22) 2. Hx Trans psoas fusion at L3-4 followed by unilateral posterior instrumentation. Her imaging however shows that she has solid fusion of the L4-5 and L5-S1 levels. This could represent a  congenital fusion but the overall appearance is more consistent with a previous fusion at those levels    PAIN:  Are you having pain? Yes: NPRS scale:5-6/10 Pain location: midline lower back Pain description: constant with fluctuation in intensity Aggravating factors: standing/walking Relieving factors: sit/lay   PRECAUTIONS: None  WEIGHT BEARING RESTRICTIONS No  FALLS:  Has patient fallen in last 6 months? No  LIVING ENVIRONMENT:  Lives in: Other [facility] Has following equipment at home: Environmental consultant - 2 wheeled and Wheelchair (power)  OCCUPATION: retired PT  PLOF: Independent  PATIENT GOALS walk independently, decrease pain   OBJECTIVE:   PATIENT SURVEYS:  FOTO EVAL: 46 12/02/22: 35 1/11: 38  MUSCLE LENGTH: EVAL: Unable to fully extend either knee in seated 12/02/22: Rt -5, Lt 0 01/06/22 Rt knee ext -2  POSTURE:  EVAL: post pelvic tilt with decreased lordosis 12/02/22: stands with hips shifted anteriorly and post balance of shoulders 1/11 hips slightly anterior to shoulders but improved from last visit with me.    LOWER EXTREMITY MMT:    MMT (age norm goal) Right eval Left eval Rt/Lt 12/7 Rt/Lt 1/11  Hip flexion (21) 19.5lb 16.7 21.6/17.4 28/22  Hip extension      Hip abduction      Hip adduction      Hip internal rotation      Hip external rotation      Knee flexion 10.7 9.2 16.1/17.6 39.5/18.7  Knee extension (46) 20.7 23.3 27.2/ 25.1 28/32  Ankle dorsiflexion      Ankle plantarflexion      Ankle inversion      Ankle eversion       (Blank rows = not tested)   FUNCTIONAL TESTS:  5 times sit to stand: EVAL: 14s 12/02/22: 16s 01/06/23: 10s  GAIT: Distance walked: to clinic and to<>from pool Assistive device utilized: Environmental consultant - 2 wheeled Level of assistance: Modified independence Comments: demonstrated heel toe pattern that is minimal but notable, flexed through thoracic spine, increased cadence    TODAY'S TREATMENT  Treatment                             01/06/23: Obj measures  Discussion regarding POC Manual: STM to lumbar paraspinals- mostly  tight on right side  PATIENT EDUCATION:  Education details: Anatomy of condition, POC, HEP, exercise form/rationale  Person educated: Patient Education method: Explanation, Demonstration, Tactile cues, Verbal cues, and Handouts Education comprehension: verbalized understanding, returned demonstration, verbal cues required, tactile cues required, and needs further education   HOME EXERCISE PROGRAM: [email or web address]  ASSESSMENT:  CLINICAL IMPRESSION Pt has made improvement in strength but denies any improvement in pain levels. She still demonstrates hips anterior of shoulders in standing creating excessive pressure through lower lumbar spine. She does feel very unstable in a stacked posture contributing to return to poor posture. Due to strength improvements, she will be moved to land-based appointments to progress balance and postural proprioception exercises.     OBJECTIVE IMPAIRMENTS Abnormal gait, decreased activity tolerance, decreased strength, increased muscle spasms, impaired flexibility, impaired sensation, improper body mechanics, postural dysfunction, and pain.   ACTIVITY LIMITATIONS standing, squatting, stairs, transfers, and locomotion level  PARTICIPATION LIMITATIONS: meal prep, cleaning, laundry, shopping, and community activity  PERSONAL FACTORS Past/current experiences and Time since onset of injury/illness/exacerbation are also affecting patient's functional outcome.     GOALS: Goals reviewed with patient? Yes  SHORT TERM GOALS: Target date: 11/05/2022  Pt will find an appropriate level of intensity in aquatic exercise for strengthening Baseline:has  Goal status: Achieved - 11/28  2.  Pt will verbalize awareness of postural alignment in seated and standing through ADLs Baseline:  Goal status: Ongoing - 11/28   LONG TERM GOALS: Target date: POC Date  Pt will meet  stated MMT goals in chart Baseline: see chart Goal status: partially met but still ongoing  2.  Pt will be able to ambulate household distances without AD and without increase in pain Baseline: petrified to be without walker Goal status: ongoing   3.  Average ain <=3/10 with ADLs Baseline: average is 5-6/10 Goal status: Ongoing   4.  Will tolerate community ambulation with AD less than walker Baseline: pt uses WC for community; RW in household  Goal status: ongoing  PLAN: PT FREQUENCY: 2x/week   PLANNED INTERVENTIONS: Therapeutic exercises, Therapeutic activity, Neuromuscular re-education, Balance training, Gait training, Patient/Family education, Self Care, Joint mobilization, Stair training, Aquatic Therapy, Dry Needling, Electrical stimulation, Spinal mobilization, Cryotherapy, Moist heat, Taping, Manual therapy, and Re-evaluation.  PLAN FOR NEXT SESSION: Land appt: Soft tissue massage to thoracolumbar paraspinals, hamstring flexibility, continue to increase heel strike in gait, balance, stacking posture with weight in heels, core engagement  Lareina Espino C. Lupe Handley PT, DPT 01/06/23 3:18 PM  [facility] [address] [place], [place], [address] Phone: [phone]   Fax:  [phone]

## 2023-01-11 ENCOUNTER — Encounter (HOSPITAL_BASED_OUTPATIENT_CLINIC_OR_DEPARTMENT_OTHER): Payer: Self-pay | Admitting: Physical Therapy

## 2023-01-11 ENCOUNTER — Ambulatory Visit (HOSPITAL_BASED_OUTPATIENT_CLINIC_OR_DEPARTMENT_OTHER): Payer: Medicare Other | Admitting: Physical Therapy

## 2023-01-11 DIAGNOSIS — M5459 Other low back pain: Secondary | ICD-10-CM

## 2023-01-11 DIAGNOSIS — M6281 Muscle weakness (generalized): Secondary | ICD-10-CM | POA: Diagnosis not present

## 2023-01-11 DIAGNOSIS — R262 Difficulty in walking, not elsewhere classified: Secondary | ICD-10-CM | POA: Diagnosis not present

## 2023-01-11 NOTE — Therapy (Signed)
OUTPATIENT PHYSICAL THERAPY THORACOLUMBAR TREATMENT   Patient Name: Stephanie Baker MRN: [id number] DOB:03-22-1941, 82 y.o., female Today's Date: 01/11/2023   PT End of Session - 01/11/23 1351     Visit Number 15    Number of Visits 30    Date for PT Re-Evaluation 02/10/23    Authorization Type MCR    Progress Note Due on Visit 25    PT Start Time 4650    PT Stop Time 1435    PT Time Calculation (min) 39 min              Past Medical History:  Diagnosis Date   Arthritis    OA   GERD (gastroesophageal reflux disease)    Hematuria    idiopathic- [facility]   Hyperlipemia    Hypertension    Hypothyroidism    Past Surgical History:  Procedure Laterality Date   ABDOMINAL HYSTERECTOMY     APPENDECTOMY     BACK SURGERY     lumbar   EYE SURGERY     bilateral cataract extraction with IOL   GASTROC RECESSION EXTREMITY Left 02/23/2018   Procedure: Left Gastroc Recession;  Surgeon: Wylene Simmer, MD;  Location: [place];  Service: Orthopedics;  Laterality: Left;   HAND SURGERY     left thumb MP   JOINT REPLACEMENT     right knee   PARTIAL KNEE ARTHROPLASTY  11/14/2012   Procedure: UNICOMPARTMENTAL KNEE;  Surgeon: Mauri Pole, MD;  Location: [facility];  Service: Orthopedics;  Laterality: Left;   TARSAL METATARSAL ARTHRODESIS Left 02/23/2018   Procedure: Left First and Second Tarsometatarsal Arthrodesis;  Surgeon: Wylene Simmer, MD;  Location: [place];  Service: Orthopedics;  Laterality: Left;   thumb surgery     left   VAGINAL HYSTERECTOMY     Patient Active Problem List   Diagnosis Date Noted   Multiple subsegmental pulmonary emboli without acute cor pulmonale ([place]) 06/08/2022   Acute deep vein thrombosis (DVT) of femoral vein of right lower extremity ([place]) 06/08/2022   Acquired hypothyroidism 06/08/2022   Slow transit constipation 06/08/2022   Spinal stenosis of thoracolumbar region 06/08/2022   Primary hypertension  06/08/2022   S/P left UKR 11/15/2012   Expected blood loss anemia 11/15/2012   Overweight (BMI 25.0-29.9) 11/15/2012     REFERRING PROVIDER: Burt Knack, PA-C  REFERRING DIAG:    Z98.1 (ICD-10-CM) - Arthrodesis status    Rationale for Evaluation and Treatment Rehabilitation  THERAPY DIAG:  Other low back pain  Muscle weakness (generalized)  Difficulty in walking, not elsewhere classified  ONSET DATE: approx early-mid 2022, surgery May 2023  SUBJECTIVE:  SUBJECTIVE STATEMENT: Pt reports that the last few days her back has hurt worse.   From EVAL: I can't walk. My husband passed away last 2023/10/29- I was having difficulty walking and back pain before then but put it off. Apr 28, 2022 has back surgery. My back still aches and I cannot walk without a walker. Balance is okay but I am really careful. Less intense pain when I sit and increases with standing. Both legs feel abnormal , almost numb but can feel my feet when walking. I am in more pain since surgery. PT at [facility] after surgery. Noted more frequent urination but no loss of control.   PERTINENT HISTORY:   Bil partial TKA, h/o DVT  Diagnosis:  S/p T11-L4 reinstrumentation, T12-L1 through L2-3 SPO's [facility] 04/28/22) 2. Hx Trans psoas fusion at L3-4 followed by unilateral posterior instrumentation. Her imaging however shows that she has solid fusion of the L4-5 and L5-S1 levels. This could represent a congenital fusion but the overall appearance is more consistent with a previous fusion at those levels    PAIN:  Are you having pain? Yes: NPRS scale:5-6/10 Pain location: midline lower back Pain description: constant with fluctuation in intensity Aggravating factors: standing/walking Relieving factors: sit/lay   PRECAUTIONS:  None  WEIGHT BEARING RESTRICTIONS No  FALLS:  Has patient fallen in last 6 months? No  LIVING ENVIRONMENT:  Lives in: Other [facility] Has following equipment at home: Environmental consultant - 2 wheeled and Wheelchair (power)  OCCUPATION: retired PT  PLOF: Independent  PATIENT GOALS walk independently, decrease pain   OBJECTIVE:   PATIENT SURVEYS:  FOTO EVAL: 46 12/02/22: 35 1/11: 38  MUSCLE LENGTH: EVAL: Unable to fully extend either knee in seated 12/02/22: Rt -5, Lt 0 01/06/22 Rt knee ext -2  POSTURE:  EVAL: post pelvic tilt with decreased lordosis 12/02/22: stands with hips shifted anteriorly and post balance of shoulders 1/11 hips slightly anterior to shoulders but improved from last visit with me.    LOWER EXTREMITY MMT:    MMT (age norm goal) Right eval Left eval Rt/Lt 12/7 Rt/Lt 1/11  Hip flexion (21) 19.5lb 16.7 21.6/17.4 28/22  Hip extension      Hip abduction      Hip adduction      Hip internal rotation      Hip external rotation      Knee flexion 10.7 9.2 16.1/17.6 39.5/18.7  Knee extension (46) 20.7 23.3 27.2/ 25.1 28/32  Ankle dorsiflexion      Ankle plantarflexion      Ankle inversion      Ankle eversion       (Blank rows = not tested)   FUNCTIONAL TESTS:  5 times sit to stand: EVAL: 14s 12/02/22: 16s 01/06/23: 10s  GAIT: Distance walked: to clinic and to<>from pool Assistive device utilized: Environmental consultant - 2 wheeled Level of assistance: Modified independence Comments: demonstrated heel toe pattern that is minimal but notable, flexed through thoracic spine, increased cadence    TODAY'S TREATMENT  Treatment                            01/11/23: THERAUPTIC Ex:  * NuStep: L2-3 (seat 8, L3, UE at 10) x 5 min  * seated quad stretch ( foot under chair) x 20s  x 2 reps each side * seated hamstring stretch x 20s x 3 reps * STS without UE with eccentric lowering x 8 * Holding counter:  hip abdct/addct x 10  x 2 (cues for forward foot position); heel raises  (tactile cues to correct posture/shoulders over hips) x 10; 3 way toe tap x 5 each side  * holding counter:  SLS x 10 sec * without support:  semi tandem stance x 20 sec each  GAIT: * At counter:  marching with UE support; single UE on counter backward/ forward gait (limited step length observed when Rt hand on counter); side stepping R/L  * with SPC in Lt hand and CGA, 20 ft trials with cues for posture, heel strike and placement of cane    PATIENT EDUCATION:  Education details: HEP -reissued with additional exercises  Person educated: Patient Education method: Explanation, Demonstration, Tactile cues, Verbal cues, and Handouts Education comprehension: verbalized understanding, returned demonstration, verbal cues required, tactile cues required, and needs further education   HOME EXERCISE PROGRAM: [email or web address]  ASSESSMENT:  CLINICAL IMPRESSION Pt given short seated rest breaks throughout session due to increased back pain with standing exercises; pain returns to base line with short rest. Trendelenburg noted when in Rt stance of gait;  with Rt hand on counter.  Gait more normalized with use of SPC in Lt hand.   She still demonstrates hips anterior of shoulders in standing creating excessive pressure through lower lumbar spine. Will continue land-based appointments to progress balance and postural proprioception exercises.   OBJECTIVE IMPAIRMENTS Abnormal gait, decreased activity tolerance, decreased strength, increased muscle spasms, impaired flexibility, impaired sensation, improper body mechanics, postural dysfunction, and pain.   ACTIVITY LIMITATIONS standing, squatting, stairs, transfers, and locomotion level  PARTICIPATION LIMITATIONS: meal prep, cleaning, laundry, shopping, and community activity  PERSONAL FACTORS Past/current experiences and Time since onset of injury/illness/exacerbation are also affecting patient's functional outcome.     GOALS: Goals reviewed with patient?  Yes  SHORT TERM GOALS: Target date: 11/05/2022  Pt will find an appropriate level of intensity in aquatic exercise for strengthening Baseline:has  Goal status: Achieved - 11/28  2.  Pt will verbalize awareness of postural alignment in seated and standing through ADLs Baseline:  Goal status: Ongoing - 11/28   LONG TERM GOALS: Target date: POC Date  Pt will meet stated MMT goals in chart Baseline: see chart Goal status: partially met but still ongoing  2.  Pt will be able to ambulate household distances without AD and without increase in pain Baseline: petrified to be without walker Goal status: ongoing   3.  Average ain <=3/10 with ADLs Baseline: average is 5-6/10 Goal status: Ongoing   4.  Will tolerate community ambulation with AD less than walker Baseline: pt uses WC for community; RW in household  Goal status: ongoing  PLAN: PT FREQUENCY: 2x/week   PLANNED INTERVENTIONS: Therapeutic exercises, Therapeutic activity, Neuromuscular re-education, Balance training, Gait training, Patient/Family education, Self Care, Joint mobilization, Stair training, Aquatic Therapy, Dry Needling, Electrical stimulation, Spinal mobilization, Cryotherapy, Moist heat, Taping, Manual therapy, and Re-evaluation.  PLAN FOR NEXT SESSION: Land appt: Soft tissue massage to thoracolumbar paraspinals, hamstring flexibility, continue to increase heel strike in gait, balance, stacking posture with weight in heels, core engagement  Kerin Perna, PTA 01/11/23 6:25 PM [facility] [place], [place], [address] Phone: [phone]   Fax:  [phone]

## 2023-01-13 ENCOUNTER — Encounter (HOSPITAL_BASED_OUTPATIENT_CLINIC_OR_DEPARTMENT_OTHER): Payer: Self-pay | Admitting: Physical Therapy

## 2023-01-13 ENCOUNTER — Ambulatory Visit (HOSPITAL_BASED_OUTPATIENT_CLINIC_OR_DEPARTMENT_OTHER): Payer: Medicare Other | Admitting: Physical Therapy

## 2023-01-13 DIAGNOSIS — M5459 Other low back pain: Secondary | ICD-10-CM

## 2023-01-13 DIAGNOSIS — R262 Difficulty in walking, not elsewhere classified: Secondary | ICD-10-CM

## 2023-01-13 DIAGNOSIS — M6281 Muscle weakness (generalized): Secondary | ICD-10-CM | POA: Diagnosis not present

## 2023-01-13 NOTE — Therapy (Signed)
OUTPATIENT PHYSICAL THERAPY THORACOLUMBAR TREATMENT   Patient Name: Stephanie Baker MRN: [id number] DOB:Mar 28, 1941, 82 y.o., female Today's Date: 01/13/2023   PT End of Session - 01/13/23 1352     Visit Number 16    Number of Visits 30    Date for PT Re-Evaluation 02/10/23    Authorization Type MCR    Progress Note Due on Visit 68    PT Start Time 1357    PT Stop Time 1435    PT Time Calculation (min) 38 min              Past Medical History:  Diagnosis Date   Arthritis    OA   GERD (gastroesophageal reflux disease)    Hematuria    idiopathic- [facility]   Hyperlipemia    Hypertension    Hypothyroidism    Past Surgical History:  Procedure Laterality Date   ABDOMINAL HYSTERECTOMY     APPENDECTOMY     BACK SURGERY     lumbar   EYE SURGERY     bilateral cataract extraction with IOL   GASTROC RECESSION EXTREMITY Left 02/23/2018   Procedure: Left Gastroc Recession;  Surgeon: Wylene Simmer, MD;  Location: [place];  Service: Orthopedics;  Laterality: Left;   HAND SURGERY     left thumb MP   JOINT REPLACEMENT     right knee   PARTIAL KNEE ARTHROPLASTY  11/14/2012   Procedure: UNICOMPARTMENTAL KNEE;  Surgeon: Mauri Pole, MD;  Location: [facility];  Service: Orthopedics;  Laterality: Left;   TARSAL METATARSAL ARTHRODESIS Left 02/23/2018   Procedure: Left First and Second Tarsometatarsal Arthrodesis;  Surgeon: Wylene Simmer, MD;  Location: [place];  Service: Orthopedics;  Laterality: Left;   thumb surgery     left   VAGINAL HYSTERECTOMY     Patient Active Problem List   Diagnosis Date Noted   Multiple subsegmental pulmonary emboli without acute cor pulmonale ([place]) 06/08/2022   Acute deep vein thrombosis (DVT) of femoral vein of right lower extremity ([place]) 06/08/2022   Acquired hypothyroidism 06/08/2022   Slow transit constipation 06/08/2022   Spinal stenosis of thoracolumbar region 06/08/2022   Primary hypertension  06/08/2022   S/P left UKR 11/15/2012   Expected blood loss anemia 11/15/2012   Overweight (BMI 25.0-29.9) 11/15/2012     REFERRING PROVIDER: Burt Knack, PA-C  REFERRING DIAG:    Z98.1 (ICD-10-CM) - Arthrodesis status    Rationale for Evaluation and Treatment Rehabilitation  THERAPY DIAG:  Other low back pain  Muscle weakness (generalized)  Difficulty in walking, not elsewhere classified  ONSET DATE: approx early-mid 2022, surgery May 2023  SUBJECTIVE:  SUBJECTIVE STATEMENT: Pt reports that she feels about the same.  No change in pain.  She states she had a busy day yesterday, so she didn't exercise.   From EVAL: I can't walk. My husband passed away last 11/07/23- I was having difficulty walking and back pain before then but put it off. Apr 28, 2022 has back surgery. My back still aches and I cannot walk without a walker. Balance is okay but I am really careful. Less intense pain when I sit and increases with standing. Both legs feel abnormal , almost numb but can feel my feet when walking. I am in more pain since surgery. PT at [facility] after surgery. Noted more frequent urination but no loss of control.   PERTINENT HISTORY:   Bil partial TKA, h/o DVT  Diagnosis:  S/p T11-L4 reinstrumentation, T12-L1 through L2-3 SPO's [facility] 04/28/22) 2. Hx Trans psoas fusion at L3-4 followed by unilateral posterior instrumentation. Her imaging however shows that she has solid fusion of the L4-5 and L5-S1 levels. This could represent a congenital fusion but the overall appearance is more consistent with a previous fusion at those levels    PAIN:  Are you having pain? Yes: NPRS scale:5/10 Pain location: midline lower back Pain description: constant with fluctuation in intensity Aggravating factors:  standing/walking Relieving factors: sit/lay   PRECAUTIONS: None  WEIGHT BEARING RESTRICTIONS No  FALLS:  Has patient fallen in last 6 months? No  LIVING ENVIRONMENT:  Lives in: Other [facility] Has following equipment at home: Environmental consultant - 2 wheeled and Wheelchair (power)  OCCUPATION: retired PT  PLOF: Independent  PATIENT GOALS walk independently, decrease pain   OBJECTIVE:   PATIENT SURVEYS:  FOTO EVAL: 46 12/02/22: 35 1/11: 38  MUSCLE LENGTH: EVAL: Unable to fully extend either knee in seated 12/02/22: Rt -5, Lt 0 01/06/22 Rt knee ext -2  POSTURE:  EVAL: post pelvic tilt with decreased lordosis 12/02/22: stands with hips shifted anteriorly and post balance of shoulders 1/11 hips slightly anterior to shoulders but improved from last visit with me.    LOWER EXTREMITY MMT:    MMT (age norm goal) Right eval Left eval Rt/Lt 12/7 Rt/Lt 1/11  Hip flexion (21) 19.5lb 16.7 21.6/17.4 28/22  Hip extension      Hip abduction      Hip adduction      Hip internal rotation      Hip external rotation      Knee flexion 10.7 9.2 16.1/17.6 39.5/18.7  Knee extension (46) 20.7 23.3 27.2/ 25.1 28/32  Ankle dorsiflexion      Ankle plantarflexion      Ankle inversion      Ankle eversion       (Blank rows = not tested)   FUNCTIONAL TESTS:  5 times sit to stand: EVAL: 14s 12/02/22: 16s 01/06/23: 10s  GAIT: Distance walked: to clinic and to<>from pool Assistive device utilized: Environmental consultant - 2 wheeled Level of assistance: Modified independence Comments: demonstrated heel toe pattern that is minimal but notable, flexed through thoracic spine, increased cadence    TODAY'S TREATMENT  Treatment                            01/13/23: GAIT: * with SPC in Lt hand and CGA trials: 129 ft; 89 ft; 162f (spread out throughout session)  with cues for posture,pacing, placement of cane * forward/backward gait with Rt hand on counter, Lt hand on SPC, cues for even stance time;  high knee  marching in place; side stepping  THERAUPTIC Ex: * BUE on counter: heel raises x 10, hip ext to toe touch x 10, hip abdct with toe touch x 10 each   *Standing bilat shoulder ext with red band (SBA) x 10  * STS without UE with eccentric lowering x 10 * bilat shoulder flexion wall slides (at counter) x 5 for shoulder/back stretch * seated hamstring stretch x 30s x 3 reps each LE   Treatment                            01/11/23: THERAUPTIC Ex:  * NuStep: L2-3 (seat 8, L3, UE at 10) x 5 min  * seated quad stretch ( foot under chair) x 20s  x 2 reps each side * seated hamstring stretch x 20s x 3 reps * STS without UE with eccentric lowering x 8 * Holding counter:  hip abdct/addct x 10 x 2 (cues for forward foot position); heel raises (tactile cues to correct posture/shoulders over hips) x 10; 3 way toe tap x 5 each side  * holding counter:  SLS x 10 sec * without support:  semi tandem stance x 20 sec each  GAIT: * At counter:  marching with UE support; single UE on counter backward/ forward gait (limited step length observed when Rt hand on counter); side stepping R/L  * with SPC in Lt hand and CGA, 20 ft trials with cues for posture, heel strike and placement of cane    PATIENT EDUCATION:  Education details: posture and body mechanics with exercises  Person educated: Patient Education method: Explanation, Demonstration, Tactile cues, Verbal cues Education comprehension: verbalized understanding, returned demonstration, verbal cues required, tactile cues required, and needs further education   HOME EXERCISE PROGRAM: [email or web address]  ASSESSMENT:  CLINICAL IMPRESSION Completed 3 trials of increased distance of gait with SPC and CGA.  Pt begins to fatigue and slow her pace after ~50-60 ft, but can complete up to 129 ft without stopping.  She tolerates exercises well, requiring short seated rest breaks throughout session.  Exercise tolerance improving gradually. Will continue land-based  appointments to progress balance and postural proprioception exercises.   OBJECTIVE IMPAIRMENTS Abnormal gait, decreased activity tolerance, decreased strength, increased muscle spasms, impaired flexibility, impaired sensation, improper body mechanics, postural dysfunction, and pain.   ACTIVITY LIMITATIONS standing, squatting, stairs, transfers, and locomotion level  PARTICIPATION LIMITATIONS: meal prep, cleaning, laundry, shopping, and community activity  PERSONAL FACTORS Past/current experiences and Time since onset of injury/illness/exacerbation are also affecting patient's functional outcome.     GOALS: Goals reviewed with patient? Yes  SHORT TERM GOALS: Target date: 11/05/2022  Pt will find an appropriate level of intensity in aquatic exercise for strengthening Baseline:has  Goal status: Achieved - 11/28  2.  Pt will verbalize awareness of postural alignment in seated and standing through ADLs Baseline:  Goal status: Ongoing - 11/28   LONG TERM GOALS: Target date: POC Date  Pt will meet stated MMT goals in chart Baseline: see chart Goal status: partially met but still ongoing  2.  Pt will be able to ambulate household distances without AD and without increase in pain Baseline: petrified to be without walker Goal status: ongoing   3.  Average ain <=3/10 with ADLs Baseline: average is 5-6/10 Goal status: Ongoing   4.  Will tolerate community ambulation with AD less than walker Baseline: pt uses WC for community; RW in household  Goal status: ongoing  PLAN: PT FREQUENCY: 2x/week   PLANNED INTERVENTIONS: Therapeutic exercises, Therapeutic activity, Neuromuscular re-education, Balance training, Gait training, Patient/Family education, Self Care, Joint mobilization, Stair training, Aquatic Therapy, Dry Needling, Electrical stimulation, Spinal mobilization, Cryotherapy, Moist heat, Taping, Manual therapy, and Re-evaluation.  PLAN FOR NEXT SESSION: Land appt: Soft  tissue massage to thoracolumbar paraspinals if indicated , hamstring flexibility, continue to increase heel strike in gait, balance, stacking posture with weight in heels, core engagement  Kerin Perna, PTA 01/13/23 6:21 PM [facility] [place], [place], [address] Phone: [phone]   Fax:  [phone]

## 2023-01-18 ENCOUNTER — Ambulatory Visit (HOSPITAL_BASED_OUTPATIENT_CLINIC_OR_DEPARTMENT_OTHER): Payer: Medicare Other | Admitting: Physical Therapy

## 2023-01-18 ENCOUNTER — Encounter (HOSPITAL_BASED_OUTPATIENT_CLINIC_OR_DEPARTMENT_OTHER): Payer: Self-pay | Admitting: Physical Therapy

## 2023-01-18 DIAGNOSIS — M6281 Muscle weakness (generalized): Secondary | ICD-10-CM | POA: Diagnosis not present

## 2023-01-18 DIAGNOSIS — R262 Difficulty in walking, not elsewhere classified: Secondary | ICD-10-CM | POA: Diagnosis not present

## 2023-01-18 DIAGNOSIS — M5459 Other low back pain: Secondary | ICD-10-CM | POA: Diagnosis not present

## 2023-01-18 NOTE — Therapy (Signed)
OUTPATIENT PHYSICAL THERAPY THORACOLUMBAR TREATMENT   Patient Name: Stephanie Baker MRN: [id number] DOB:Aug 03, 1941, 82 y.o., female Today's Date: 01/18/2023   PT End of Session - 01/18/23 1404     Visit Number 17    Number of Visits 30    Date for PT Re-Evaluation 02/10/23    Authorization Type MCR    Progress Note Due on Visit 19    PT Start Time 1400    PT Stop Time 1426    PT Time Calculation (min) 26 min    Activity Tolerance Patient limited by fatigue    Behavior During Therapy WFL for tasks assessed/performed              Past Medical History:  Diagnosis Date   Arthritis    OA   GERD (gastroesophageal reflux disease)    Hematuria    idiopathic- [facility]   Hyperlipemia    Hypertension    Hypothyroidism    Past Surgical History:  Procedure Laterality Date   ABDOMINAL HYSTERECTOMY     APPENDECTOMY     BACK SURGERY     lumbar   EYE SURGERY     bilateral cataract extraction with IOL   GASTROC RECESSION EXTREMITY Left 02/23/2018   Procedure: Left Gastroc Recession;  Surgeon: Wylene Simmer, MD;  Location: [place];  Service: Orthopedics;  Laterality: Left;   HAND SURGERY     left thumb MP   JOINT REPLACEMENT     right knee   PARTIAL KNEE ARTHROPLASTY  11/14/2012   Procedure: UNICOMPARTMENTAL KNEE;  Surgeon: Mauri Pole, MD;  Location: [facility];  Service: Orthopedics;  Laterality: Left;   TARSAL METATARSAL ARTHRODESIS Left 02/23/2018   Procedure: Left First and Second Tarsometatarsal Arthrodesis;  Surgeon: Wylene Simmer, MD;  Location: [place];  Service: Orthopedics;  Laterality: Left;   thumb surgery     left   VAGINAL HYSTERECTOMY     Patient Active Problem List   Diagnosis Date Noted   Multiple subsegmental pulmonary emboli without acute cor pulmonale ([place]) 06/08/2022   Acute deep vein thrombosis (DVT) of femoral vein of right lower extremity ([place]) 06/08/2022   Acquired hypothyroidism 06/08/2022   Slow  transit constipation 06/08/2022   Spinal stenosis of thoracolumbar region 06/08/2022   Primary hypertension 06/08/2022   S/P left UKR 11/15/2012   Expected blood loss anemia 11/15/2012   Overweight (BMI 25.0-29.9) 11/15/2012     REFERRING PROVIDER: Burt Knack, PA-C  REFERRING DIAG:    Z98.1 (ICD-10-CM) - Arthrodesis status    Rationale for Evaluation and Treatment Rehabilitation  THERAPY DIAG:  Other low back pain  Muscle weakness (generalized)  Difficulty in walking, not elsewhere classified  ONSET DATE: approx early-mid 2022, surgery May 2023  SUBJECTIVE:  SUBJECTIVE STATEMENT: Pt reports that she has been using cane around house "some".  She has been very busy today, didn't have a chance to rest her back this morning like she normally does.    From EVAL: I can't walk. My husband passed away last Oct 17, 2023- I was having difficulty walking and back pain before then but put it off. Apr 28, 2022 has back surgery. My back still aches and I cannot walk without a walker. Balance is okay but I am really careful. Less intense pain when I sit and increases with standing. Both legs feel abnormal , almost numb but can feel my feet when walking. I am in more pain since surgery. PT at [facility] after surgery. Noted more frequent urination but no loss of control.   PERTINENT HISTORY:   Bil partial TKA, h/o DVT  Diagnosis:  S/p T11-L4 reinstrumentation, T12-L1 through L2-3 SPO's [facility] 04/28/22) 2. Hx Trans psoas fusion at L3-4 followed by unilateral posterior instrumentation. Her imaging however shows that she has solid fusion of the L4-5 and L5-S1 levels. This could represent a congenital fusion but the overall appearance is more consistent with a previous fusion at those levels    PAIN:  Are you having  pain? Yes: NPRS scale:5/10 Pain location: midline lower back Pain description: constant with fluctuation in intensity Aggravating factors: standing/walking Relieving factors: sit/lay   PRECAUTIONS: None  WEIGHT BEARING RESTRICTIONS No  FALLS:  Has patient fallen in last 6 months? No  LIVING ENVIRONMENT:  Lives in: Other [facility] Has following equipment at home: Environmental consultant - 2 wheeled and Wheelchair (power)  OCCUPATION: retired PT  PLOF: Independent  PATIENT GOALS walk independently, decrease pain   OBJECTIVE:   PATIENT SURVEYS:  FOTO EVAL: 46 12/02/22: 35 1/11: 38  MUSCLE LENGTH: EVAL: Unable to fully extend either knee in seated 12/02/22: Rt -5, Lt 0 01/06/22 Rt knee ext -2  POSTURE:  EVAL: post pelvic tilt with decreased lordosis 12/02/22: stands with hips shifted anteriorly and post balance of shoulders 1/11 hips slightly anterior to shoulders but improved from last visit with me.    LOWER EXTREMITY MMT:    MMT (age norm goal) Right eval Left eval Rt/Lt 12/7 Rt/Lt 1/11  Hip flexion (21) 19.5lb 16.7 21.6/17.4 28/22  Hip extension      Hip abduction      Hip adduction      Hip internal rotation      Hip external rotation      Knee flexion 10.7 9.2 16.1/17.6 39.5/18.7  Knee extension (46) 20.7 23.3 27.2/ 25.1 28/32  Ankle dorsiflexion      Ankle plantarflexion      Ankle inversion      Ankle eversion       (Blank rows = not tested)   FUNCTIONAL TESTS:  5 times sit to stand: EVAL: 14s 12/02/22: 16s 01/06/23: 10s  GAIT: Distance walked: to clinic and to<>from pool Assistive device utilized: Environmental consultant - 2 wheeled Level of assistance: Modified independence Comments: demonstrated heel toe pattern that is minimal but notable, flexed through thoracic spine, increased cadence    TODAY'S TREATMENT  Treatment                            01/18/23: THERAUPTIC Ex: * NuStep, L2 x 4 min, UE @ 9 for warm up * bilat shoulder flexion wall slides (at counter) x 5  for shoulder/back stretch * BUE on counter: side to side lunge for  adductor stretch x 5 each;  heel raises x 10 * seated hamstring stretch x 15s x 2 reps each LE *Standing row with same side step back x 10 with RLE, and staggered stance and Lt row x 10 * holding counter:  hip ext to toe touch x 10  GAIT: * with SPC in Lt hand and CGA trials: 100 ft; 45 ft - standing rest - then 116 additional ft (spread out throughout session)  with cues for posture   Treatment                            01/13/23: GAIT: * with SPC in Lt hand and CGA trials: 129 ft; 89 ft; 153f (spread out throughout session)  with cues for posture,pacing, placement of cane * forward/backward gait with Rt hand on counter, Lt hand on SPC, cues for even stance time; high knee marching in place; side stepping  THERAUPTIC Ex: * BUE on counter: heel raises x 10, hip ext to toe touch x 10, hip abdct with toe touch x 10 each   *Standing bilat shoulder ext with red band (SBA) x 10  * STS without UE with eccentric lowering x 10 * bilat shoulder flexion wall slides (at counter) x 5 for shoulder/back stretch * seated hamstring stretch x 30s x 3 reps each LE   Treatment                            01/11/23: THERAUPTIC Ex:  * NuStep: L2-3 (seat 8, L3, UE at 10) x 5 min  * seated quad stretch ( foot under chair) x 20s  x 2 reps each side * seated hamstring stretch x 20s x 3 reps * STS without UE with eccentric lowering x 8 * Holding counter:  hip abdct/addct x 10 x 2 (cues for forward foot position); heel raises (tactile cues to correct posture/shoulders over hips) x 10; 3 way toe tap x 5 each side  * holding counter:  SLS x 10 sec * without support:  semi tandem stance x 20 sec each  GAIT: * At counter:  marching with UE support; single UE on counter backward/ forward gait (limited step length observed when Rt hand on counter); side stepping R/L  * with SPC in Lt hand and CGA, 20 ft trials with cues for posture, heel strike and  placement of cane    PATIENT EDUCATION:  Education details: posture and body mechanics with exercises  Person educated: Patient Education method: Explanation, Demonstration, Tactile cues, Verbal cues Education comprehension: verbalized understanding, returned demonstration, verbal cues required, tactile cues required, and needs further education   HOME EXERCISE PROGRAM: [email or web address]  ASSESSMENT:  CLINICAL IMPRESSION Pt verbalized fatigue upon arrival due to busy schedule.  Session shortened per pt request.  She demonstrated difficulty with Lt foot step backs during single row (bow and arrow), so finished set in stationary staggered stance.  She continues to require short seated rest breaks throughout session.   Will continue land-based appointments to progress balance and postural proprioception exercises.   OBJECTIVE IMPAIRMENTS Abnormal gait, decreased activity tolerance, decreased strength, increased muscle spasms, impaired flexibility, impaired sensation, improper body mechanics, postural dysfunction, and pain.   ACTIVITY LIMITATIONS standing, squatting, stairs, transfers, and locomotion level  PARTICIPATION LIMITATIONS: meal prep, cleaning, laundry, shopping, and community activity  PERSONAL FACTORS Past/current experiences and Time since onset of injury/illness/exacerbation are also affecting patient's  functional outcome.     GOALS: Goals reviewed with patient? Yes  SHORT TERM GOALS: Target date: 11/05/2022  Pt will find an appropriate level of intensity in aquatic exercise for strengthening Baseline:has  Goal status: Achieved - 11/28  2.  Pt will verbalize awareness of postural alignment in seated and standing through ADLs Baseline:  Goal status: Ongoing - 11/28   LONG TERM GOALS: Target date: POC Date  Pt will meet stated MMT goals in chart Baseline: see chart Goal status: partially met but still ongoing  2.  Pt will be able to ambulate household distances  without AD and without increase in pain Baseline: petrified to be without walker Goal status: ongoing   3.  Average ain <=3/10 with ADLs Baseline: average is 5-6/10 Goal status: Ongoing   4.  Will tolerate community ambulation with AD less than walker Baseline: pt uses WC for community; RW in household  Goal status: ongoing  PLAN: PT FREQUENCY: 2x/week   PLANNED INTERVENTIONS: Therapeutic exercises, Therapeutic activity, Neuromuscular re-education, Balance training, Gait training, Patient/Family education, Self Care, Joint mobilization, Stair training, Aquatic Therapy, Dry Needling, Electrical stimulation, Spinal mobilization, Cryotherapy, Moist heat, Taping, Manual therapy, and Re-evaluation.  PLAN FOR NEXT SESSION: Land appt: Soft tissue massage to thoracolumbar paraspinals if indicated , hamstring flexibility, continue to increase heel strike in gait, balance, stacking posture with weight in heels, core engagement

## 2023-01-20 ENCOUNTER — Ambulatory Visit (HOSPITAL_BASED_OUTPATIENT_CLINIC_OR_DEPARTMENT_OTHER): Payer: Medicare Other | Admitting: Physical Therapy

## 2023-01-20 DIAGNOSIS — I10 Essential (primary) hypertension: Secondary | ICD-10-CM | POA: Diagnosis not present

## 2023-01-20 DIAGNOSIS — M47812 Spondylosis without myelopathy or radiculopathy, cervical region: Secondary | ICD-10-CM | POA: Diagnosis not present

## 2023-01-20 DIAGNOSIS — Z981 Arthrodesis status: Secondary | ICD-10-CM | POA: Diagnosis not present

## 2023-01-20 DIAGNOSIS — M4150 Other secondary scoliosis, site unspecified: Secondary | ICD-10-CM | POA: Diagnosis not present

## 2023-01-20 DIAGNOSIS — M5134 Other intervertebral disc degeneration, thoracic region: Secondary | ICD-10-CM | POA: Diagnosis not present

## 2023-01-20 DIAGNOSIS — M21162 Varus deformity, not elsewhere classified, left knee: Secondary | ICD-10-CM | POA: Diagnosis not present

## 2023-01-20 DIAGNOSIS — M5030 Other cervical disc degeneration, unspecified cervical region: Secondary | ICD-10-CM | POA: Diagnosis not present

## 2023-01-20 DIAGNOSIS — Z86711 Personal history of pulmonary embolism: Secondary | ICD-10-CM | POA: Diagnosis not present

## 2023-01-25 ENCOUNTER — Encounter (HOSPITAL_BASED_OUTPATIENT_CLINIC_OR_DEPARTMENT_OTHER): Payer: Self-pay | Admitting: Physical Therapy

## 2023-01-25 ENCOUNTER — Ambulatory Visit (HOSPITAL_BASED_OUTPATIENT_CLINIC_OR_DEPARTMENT_OTHER): Payer: Medicare Other | Admitting: Physical Therapy

## 2023-01-25 DIAGNOSIS — R262 Difficulty in walking, not elsewhere classified: Secondary | ICD-10-CM | POA: Diagnosis not present

## 2023-01-25 DIAGNOSIS — M5459 Other low back pain: Secondary | ICD-10-CM

## 2023-01-25 DIAGNOSIS — M6281 Muscle weakness (generalized): Secondary | ICD-10-CM | POA: Diagnosis not present

## 2023-01-25 NOTE — Therapy (Signed)
OUTPATIENT PHYSICAL THERAPY THORACOLUMBAR TREATMENT   Patient Name: Stephanie Baker MRN: [id number] DOB:[birth year], 82 y.o., female Today's Date: 01/25/2023   PT End of Session - 01/25/23 1403     Visit Number 18    Number of Visits 30    Date for PT Re-Evaluation 02/10/23    Authorization Type MCR    Progress Note Due on Visit 46    PT Start Time 1402    PT Stop Time 1444    PT Time Calculation (min) 42 min    Behavior During Therapy WFL for tasks assessed/performed              Past Medical History:  Diagnosis Date   Arthritis    OA   GERD (gastroesophageal reflux disease)    Hematuria    idiopathic- [facility]   Hyperlipemia    Hypertension    Hypothyroidism    Past Surgical History:  Procedure Laterality Date   ABDOMINAL HYSTERECTOMY     APPENDECTOMY     BACK SURGERY     lumbar   EYE SURGERY     bilateral cataract extraction with IOL   GASTROC RECESSION EXTREMITY Left 02/23/2018   Procedure: Left Gastroc Recession;  Surgeon: Wylene Simmer, MD;  Location: [place];  Service: Orthopedics;  Laterality: Left;   HAND SURGERY     left thumb MP   JOINT REPLACEMENT     right knee   PARTIAL KNEE ARTHROPLASTY  11/14/2012   Procedure: UNICOMPARTMENTAL KNEE;  Surgeon: Mauri Pole, MD;  Location: [facility];  Service: Orthopedics;  Laterality: Left;   TARSAL METATARSAL ARTHRODESIS Left 02/23/2018   Procedure: Left First and Second Tarsometatarsal Arthrodesis;  Surgeon: Wylene Simmer, MD;  Location: [place];  Service: Orthopedics;  Laterality: Left;   thumb surgery     left   VAGINAL HYSTERECTOMY     Patient Active Problem List   Diagnosis Date Noted   Multiple subsegmental pulmonary emboli without acute cor pulmonale ([place]) 06/08/2022   Acute deep vein thrombosis (DVT) of femoral vein of right lower extremity ([place]) 06/08/2022   Acquired hypothyroidism 06/08/2022   Slow transit constipation 06/08/2022   Spinal stenosis  of thoracolumbar region 06/08/2022   Primary hypertension 06/08/2022   S/P left UKR 11/15/2012   Expected blood loss anemia 11/15/2012   Overweight (BMI 25.0-29.9) 11/15/2012     REFERRING PROVIDER: Burt Knack, PA-C  REFERRING DIAG:    Z98.1 (ICD-10-CM) - Arthrodesis status    Rationale for Evaluation and Treatment Rehabilitation  THERAPY DIAG:  Other low back pain  Muscle weakness (generalized)  Difficulty in walking, not elsewhere classified  ONSET DATE: approx early-mid 2022, surgery May 2023  SUBJECTIVE:  SUBJECTIVE STATEMENT: Pt reports that she had a bad weekend because her back hurt. "I'm not doing good".     From EVAL: I can't walk. My husband passed away last 2023/11/03- I was having difficulty walking and back pain before then but put it off. Apr 28, 2022 has back surgery. My back still aches and I cannot walk without a walker. Balance is okay but I am really careful. Less intense pain when I sit and increases with standing. Both legs feel abnormal , almost numb but can feel my feet when walking. I am in more pain since surgery. PT at [facility] after surgery. Noted more frequent urination but no loss of control.   PERTINENT HISTORY:   Bil partial TKA, h/o DVT  Diagnosis:  S/p T11-L4 reinstrumentation, T12-L1 through L2-3 [facility] 04/28/22) 2. Hx Trans psoas fusion at L3-4 followed by unilateral posterior instrumentation. Her imaging however shows that she has solid fusion of the L4-5 and L5-S1 levels. This could represent a congenital fusion but the overall appearance is more consistent with a previous fusion at those levels    PAIN:  Are you having pain? Yes: NPRS scale:5/10 Pain location: midline lower back;  Rt sacral border  Pain description: constant with fluctuation in  intensity Aggravating factors: standing/walking Relieving factors: sit/lay   PRECAUTIONS: None  WEIGHT BEARING RESTRICTIONS No  FALLS:  Has patient fallen in last 6 months? No  LIVING ENVIRONMENT:  Lives in: Other [facility] Has following equipment at home: Environmental consultant - 2 wheeled and Wheelchair (power)  OCCUPATION: retired PT  PLOF: Independent  PATIENT GOALS walk independently, decrease pain   OBJECTIVE:   PATIENT SURVEYS:  FOTO EVAL: 46 12/02/22: 35 1/11: 38  MUSCLE LENGTH: EVAL: Unable to fully extend either knee in seated 12/02/22: Rt -5, Lt 0 01/06/22 Rt knee ext -2  POSTURE:  EVAL: post pelvic tilt with decreased lordosis 12/02/22: stands with hips shifted anteriorly and post balance of shoulders 1/11 hips slightly anterior to shoulders but improved from last visit with me.    LOWER EXTREMITY MMT:    MMT (age norm goal) Right eval Left eval Rt/Lt 12/7 Rt/Lt 1/11  Hip flexion (21) 19.5lb 16.7 21.6/17.4 28/22  Hip extension      Hip abduction      Hip adduction      Hip internal rotation      Hip external rotation      Knee flexion 10.7 9.2 16.1/17.6 39.5/18.7  Knee extension (46) 20.7 23.3 27.2/ 25.1 28/32  Ankle dorsiflexion      Ankle plantarflexion      Ankle inversion      Ankle eversion       (Blank rows = not tested)   FUNCTIONAL TESTS:  5 times sit to stand: EVAL: 14s 12/02/22: 16s 01/06/23: 10s  GAIT: Distance walked: to clinic and to<>from pool Assistive device utilized: Environmental consultant - 2 wheeled Level of assistance: Modified independence Comments: demonstrated heel toe pattern that is minimal but notable, flexed through thoracic spine, increased cadence    TODAY'S TREATMENT  Treatment                            01/25/23: Manual: In Lt sidelying - STM and TPR to Rt piriformis with contract relax of muscle; STM to Rt glute max, med and lower back paraspinals to decrease fascial restrictions and pain, and improve mobility  Therapeutic  exercise: * Rt sidelying clams x 10 * STS  from elevated table x 8 * Holding counter: Heel raises x 10; side to side lunge for hip stretch;   Hip ext x 5 x 2; hip abdct x 10 each * near counter:  semi-tandem stance x 20 sec each LE forward * bilat shoulder flexion wall slides (at counter) x 5 for shoulder/back stretch  Gait:   * with SPC in Lt hand and SBA trials cues for posture and pacing;- 124 ft,  70 ft; 130 ft  Vitals: At rest 2 min after first trial 97%, HR 77;  During 2nd gait trial:  103 BPM, 96%    Treatment                            01/18/23: THERAUPTIC Ex: * NuStep, L2 x 4 min, UE @ 9 for warm up * bilat shoulder flexion wall slides (at counter) x 5 for shoulder/back stretch * BUE on counter: side to side lunge for adductor stretch x 5 each;  heel raises x 10 * seated hamstring stretch x 15s x 2 reps each LE *Standing row with same side step back x 10 with RLE, and staggered stance and Lt row x 10 * holding counter:  hip ext to toe touch x 10  GAIT: * with SPC in Lt hand and CGA trials: 100 ft; 45 ft - standing rest - then 116 additional ft (spread out throughout session)  with cues for posture   Treatment                            01/13/23: GAIT: * with SPC in Lt hand and CGA trials: 129 ft; 89 ft; 110f (spread out throughout session)  with cues for posture,pacing, placement of cane * forward/backward gait with Rt hand on counter, Lt hand on SPC, cues for even stance time; high knee marching in place; side stepping  THERAUPTIC Ex: * BUE on counter: heel raises x 10, hip ext to toe touch x 10, hip abdct with toe touch x 10 each   *Standing bilat shoulder ext with red band (SBA) x 10  * STS without UE with eccentric lowering x 10 * bilat shoulder flexion wall slides (at counter) x 5 for shoulder/back stretch * seated hamstring stretch x 30s x 3 reps each LE   Treatment                            01/11/23: THERAUPTIC Ex:  * NuStep: L2-3 (seat 8, L3, UE at 10) x 5 min   * seated quad stretch ( foot under chair) x 20s  x 2 reps each side * seated hamstring stretch x 20s x 3 reps * STS without UE with eccentric lowering x 8 * Holding counter:  hip abdct/addct x 10 x 2 (cues for forward foot position); heel raises (tactile cues to correct posture/shoulders over hips) x 10; 3 way toe tap x 5 each side  * holding counter:  SLS x 10 sec * without support:  semi tandem stance x 20 sec each  GAIT: * At counter:  marching with UE support; single UE on counter backward/ forward gait (limited step length observed when Rt hand on counter); side stepping R/L  * with SPC in Lt hand and CGA, 20 ft trials with cues for posture, heel strike and placement of cane    PATIENT EDUCATION:  Education details: posture and body mechanics with exercises  Person educated: Patient Education method: Explanation, Demonstration, Tactile cues, Verbal cues Education comprehension: verbalized understanding, returned demonstration, verbal cues required, tactile cues required, and needs further education   HOME EXERCISE PROGRAM: [email or web address]  ASSESSMENT:  CLINICAL IMPRESSION Pt verbalized fatigue upon arrival due to busy schedule.  No change in pain level during session.  She is highly sensitive to light-med pressure with STM to Rt hip.  She continues to require short seated rest breaks throughout session due to fatigue and elevated back pain.  Despite fatigue level, she is tolerating longer gait distances each visit.    Will continue land-based appointments to progress balance and postural proprioception exercises.   OBJECTIVE IMPAIRMENTS Abnormal gait, decreased activity tolerance, decreased strength, increased muscle spasms, impaired flexibility, impaired sensation, improper body mechanics, postural dysfunction, and pain.   ACTIVITY LIMITATIONS standing, squatting, stairs, transfers, and locomotion level  PARTICIPATION LIMITATIONS: meal prep, cleaning, laundry, shopping, and community  activity  PERSONAL FACTORS Past/current experiences and Time since onset of injury/illness/exacerbation are also affecting patient's functional outcome.     GOALS: Goals reviewed with patient? Yes  SHORT TERM GOALS: Target date: 11/05/2022  Pt will find an appropriate level of intensity in aquatic exercise for strengthening Baseline:has  Goal status: Achieved - 11/28  2.  Pt will verbalize awareness of postural alignment in seated and standing through ADLs Baseline:  Goal status: Ongoing - 11/28   LONG TERM GOALS: Target date: POC Date  Pt will meet stated MMT goals in chart Baseline: see chart Goal status: partially met but still ongoing  2.  Pt will be able to ambulate household distances without AD and without increase in pain Baseline: petrified to be without walker Goal status: ongoing   3.  Average ain <=3/10 with ADLs Baseline: average is 5-6/10 Goal status: Ongoing   4.  Will tolerate community ambulation with AD less than walker Baseline: pt uses WC for community; RW in household  Goal status: ongoing  PLAN: PT FREQUENCY: 2x/week   PLANNED INTERVENTIONS: Therapeutic exercises, Therapeutic activity, Neuromuscular re-education, Balance training, Gait training, Patient/Family education, Self Care, Joint mobilization, Stair training, Aquatic Therapy, Dry Needling, Electrical stimulation, Spinal mobilization, Cryotherapy, Moist heat, Taping, Manual therapy, and Re-evaluation.  PLAN FOR NEXT SESSION: Land appt: Soft tissue massage to thoracolumbar paraspinals if indicated , hamstring flexibility, continue to increase heel strike in gait, balance, stacking posture with weight in heels, core engagement  Kerin Perna, PTA 01/25/23 6:27 PM [facility] [place], [place], [address] Phone: [phone]   Fax:  [phone]

## 2023-01-27 ENCOUNTER — Ambulatory Visit (HOSPITAL_BASED_OUTPATIENT_CLINIC_OR_DEPARTMENT_OTHER): Payer: Medicare Other | Attending: Orthopedic Surgery | Admitting: Physical Therapy

## 2023-01-27 ENCOUNTER — Encounter (HOSPITAL_BASED_OUTPATIENT_CLINIC_OR_DEPARTMENT_OTHER): Payer: Self-pay | Admitting: Physical Therapy

## 2023-01-27 DIAGNOSIS — M6281 Muscle weakness (generalized): Secondary | ICD-10-CM | POA: Diagnosis not present

## 2023-01-27 DIAGNOSIS — R262 Difficulty in walking, not elsewhere classified: Secondary | ICD-10-CM | POA: Insufficient documentation

## 2023-01-27 DIAGNOSIS — M5459 Other low back pain: Secondary | ICD-10-CM | POA: Insufficient documentation

## 2023-01-27 NOTE — Therapy (Addendum)
OUTPATIENT PHYSICAL THERAPY THORACOLUMBAR TREATMENT   Patient Name: Stephanie Baker MRN: [id number] DOB:1941/01/22, 82 y.o., female Today's Date: 01/27/2023   PT End of Session - 01/27/23 1400     Visit Number 19    Number of Visits 30    Date for PT Re-Evaluation 02/10/23    Authorization Type MCR    Progress Note Due on Visit 71    PT Start Time 1400    PT Stop Time 1442    PT Time Calculation (min) 42 min    Equipment Utilized During Treatment Gait belt    Behavior During Therapy WFL for tasks assessed/performed              Past Medical History:  Diagnosis Date   Arthritis    OA   GERD (gastroesophageal reflux disease)    Hematuria    idiopathic- Dr[facility]   Hyperlipemia    Hypertension    Hypothyroidism    Past Surgical History:  Procedure Laterality Date   ABDOMINAL HYSTERECTOMY     APPENDECTOMY     BACK SURGERY     lumbar   EYE SURGERY     bilateral cataract extraction with IOL   GASTROC RECESSION EXTREMITY Left 02/23/2018   Procedure: Left Gastroc Recession;  Surgeon: Wylene Simmer, MD;  Location: [place];  Service: Orthopedics;  Laterality: Left;   HAND SURGERY     left thumb MP   JOINT REPLACEMENT     right knee   PARTIAL KNEE ARTHROPLASTY  11/14/2012   Procedure: UNICOMPARTMENTAL KNEE;  Surgeon: Mauri Pole, MD;  Location: [facility];  Service: Orthopedics;  Laterality: Left;   TARSAL METATARSAL ARTHRODESIS Left 02/23/2018   Procedure: Left First and Second Tarsometatarsal Arthrodesis;  Surgeon: Wylene Simmer, MD;  Location: [place];  Service: Orthopedics;  Laterality: Left;   thumb surgery     left   VAGINAL HYSTERECTOMY     Patient Active Problem List   Diagnosis Date Noted   Multiple subsegmental pulmonary emboli without acute cor pulmonale ([place]) 06/08/2022   Acute deep vein thrombosis (DVT) of femoral vein of right lower extremity ([place]) 06/08/2022   Acquired hypothyroidism 06/08/2022   Slow  transit constipation 06/08/2022   Spinal stenosis of thoracolumbar region 06/08/2022   Primary hypertension 06/08/2022   S/P left UKR 11/15/2012   Expected blood loss anemia 11/15/2012   Overweight (BMI 25.0-29.9) 11/15/2012     REFERRING PROVIDER: Burt Knack, PA-C  REFERRING DIAG:    Z98.1 (ICD-10-CM) - Arthrodesis status    Rationale for Evaluation and Treatment Rehabilitation  THERAPY DIAG:  Other low back pain  Muscle weakness (generalized)  Difficulty in walking, not elsewhere classified  ONSET DATE: approx early-mid 2022, surgery May 2023  SUBJECTIVE:  Progress Note Reporting Period 12/03/23 to 01/27/23  See note below for Objective Data and Assessment of Progress/Goals.     SUBJECTIVE STATEMENT: Pt reports she has been riding NuStep every other day. "I haven't noticed a lot (of change) and I'm still not comfortable walking with the cane".  She has not tried the biofreeze sample issued last session, because she doesn't want it to get on her clothing.     From EVAL: I can't walk. My husband passed away last November 06, 2023- I was having difficulty walking and back pain before then but put it off. Apr 28, 2022 has back surgery. My back still aches and I cannot walk without a walker. Balance is okay but I am really careful. Less intense pain when I sit and increases with standing. Both legs feel abnormal , almost numb but can feel my feet when walking. I am in more pain since surgery. PT at [facility] after surgery. Noted more frequent urination but no loss of control.   PERTINENT HISTORY:   Bil partial TKA, h/o DVT  Diagnosis:  S/p T11-L4 reinstrumentation, T12-L1 through L2-3 SPO's [facility] 04/28/22) 2. Hx Trans psoas fusion at L3-4 followed by unilateral posterior instrumentation. Her imaging  however shows that she has solid fusion of the L4-5 and L5-S1 levels. This could represent a congenital fusion but the overall appearance is more consistent with a previous fusion at those levels    PAIN:  Are you having pain? Yes: NPRS scale:5/10 Pain location: midline lower back;  Rt sacral border  Pain description: constant with fluctuation in intensity Aggravating factors: standing/walking Relieving factors: sit/lay   PRECAUTIONS: None  WEIGHT BEARING RESTRICTIONS No  FALLS:  Has patient fallen in last 6 months? No  LIVING ENVIRONMENT:  Lives in: Other [facility] Has following equipment at home: Environmental consultant - 2 wheeled and Wheelchair (power)  OCCUPATION: retired PT  PLOF: Independent  PATIENT GOALS walk independently, decrease pain   OBJECTIVE:   PATIENT SURVEYS:  FOTO EVAL: 45 12/02/22: 35 1/11: 38  MUSCLE LENGTH: EVAL: Unable to fully extend either knee in seated 12/02/22: Rt -5, Lt 0 01/06/22 Rt knee ext -2  POSTURE:  EVAL: post pelvic tilt with decreased lordosis 12/02/22: stands with hips shifted anteriorly and post balance of shoulders 1/11 hips slightly anterior to shoulders but improved from last visit with me.    LOWER EXTREMITY MMT:    MMT (age norm goal) Right eval Left eval Rt/Lt 12/7 Rt/Lt 1/11 Rt/Lt 01/27/23   Hip flexion (21) 19.5lb 16.7 21.6/17.4 28/22 27.4/19.7  Hip extension       Hip abduction       Hip adduction       Hip internal rotation       Hip external rotation       Knee flexion 10.7 9.2 16.1/17.6 39.5/18.7 34.3/21.0  Knee extension (46) 20.7 23.3 27.2/ 25.1 28/32 22.3/25.3  Ankle dorsiflexion       Ankle plantarflexion       Ankle inversion       Ankle eversion        (Blank rows = not tested)   FUNCTIONAL TESTS:  5 times sit to stand: EVAL: 14s 12/02/22: 16s 01/06/23: 10s  GAIT: Distance walked: to clinic and to<>from pool Assistive device utilized: Environmental consultant - 2 wheeled Level of assistance: Modified  independence Comments: demonstrated heel toe pattern that is minimal but notable, flexed through thoracic spine, increased cadence    TODAY'S TREATMENT  Treatment  01/27/23: Objective measures (MMT)  Therapeutic exercise: * marching at counter; bilat shoulder flexion wall slides (at counter) x 5 for shoulder/back stretch * on airex pad: NBOS balance with/ without horiz/ vertical head turns; wide stance with R/L weight shifts with overhead side reach, outside BOS * Seated bilat row with green band -> red band x 10 * Standing bilat shoulder ext with red band x 10 with CGA * seated hamstring stretch x 30s x 2; fig 4 stretch x 20s x 2 RLE, x 1 LLE  Gait:   * with SPC in Lt hand and SBA trials cues for posture and pacing;- 50 ft, 165 ft, 145 ft  Vitals: 96-97% spO2 and up to 102 bpm during gait, returned to 82bpm within 1 min.   Treatment                            01/25/23: Manual: In Lt sidelying - STM and TPR to Rt piriformis with contract relax of muscle; STM to Rt glute max, med and lower back paraspinals to decrease fascial restrictions and pain, and improve mobility  Therapeutic exercise: * Rt sidelying clams x 10 * STS from elevated table x 8 * Holding counter: Heel raises x 10; side to side lunge for hip stretch;   Hip ext x 5 x 2; hip abdct x 10 each * near counter:  semi-tandem stance x 20 sec each LE forward * bilat shoulder flexion wall slides (at counter) x 5 for shoulder/back stretch  Gait:   * with SPC in Lt hand and SBA trials cues for posture and pacing;- 124 ft,  70 ft; 130 ft  Vitals: At rest 2 min after first trial 97%, HR 77;  During 2nd gait trial:  103 BPM, 96%    Treatment                            01/18/23: THERAUPTIC Ex: * NuStep, L2 x 4 min, UE @ 9 for warm up * bilat shoulder flexion wall slides (at counter) x 5 for shoulder/back stretch * BUE on counter: side to side lunge for adductor stretch x 5 each;  heel raises x 10 *  seated hamstring stretch x 15s x 2 reps each LE *Standing row with same side step back x 10 with RLE, and staggered stance and Lt row x 10 * holding counter:  hip ext to toe touch x 10  GAIT: * with SPC in Lt hand and CGA trials: 100 ft; 45 ft - standing rest - then 116 additional ft (spread out throughout session)  with cues for posture   Treatment                            01/13/23: GAIT: * with SPC in Lt hand and CGA trials: 129 ft; 89 ft; 172f (spread out throughout session)  with cues for posture,pacing, placement of cane * forward/backward gait with Rt hand on counter, Lt hand on SPC, cues for even stance time; high knee marching in place; side stepping  THERAUPTIC Ex: * BUE on counter: heel raises x 10, hip ext to toe touch x 10, hip abdct with toe touch x 10 each   *Standing bilat shoulder ext with red band (SBA) x 10  * STS without UE with eccentric lowering x 10 * bilat shoulder flexion wall slides (at  counter) x 5 for shoulder/back stretch * seated hamstring stretch x 30s x 3 reps each LE   Treatment                            01/11/23: THERAUPTIC Ex:  * NuStep: L2-3 (seat 8, L3, UE at 10) x 5 min  * seated quad stretch ( foot under chair) x 20s  x 2 reps each side * seated hamstring stretch x 20s x 3 reps * STS without UE with eccentric lowering x 8 * Holding counter:  hip abdct/addct x 10 x 2 (cues for forward foot position); heel raises (tactile cues to correct posture/shoulders over hips) x 10; 3 way toe tap x 5 each side  * holding counter:  SLS x 10 sec * without support:  semi tandem stance x 20 sec each  GAIT: * At counter:  marching with UE support; single UE on counter backward/ forward gait (limited step length observed when Rt hand on counter); side stepping R/L  * with SPC in Lt hand and CGA, 20 ft trials with cues for posture, heel strike and placement of cane    PATIENT EDUCATION:  Education details: posture and body mechanics with exercises Person  educated: Patient Education method: Explanation, Demonstration, Tactile cues, Verbal cues Education comprehension: verbalized understanding, returned demonstration, verbal cues required, tactile cues required, and needs further education   HOME EXERCISE PROGRAM: [id number]  ASSESSMENT:  CLINICAL IMPRESSION Pt's LE strength decreased from measurement last month, however she is able to walk further distances with SPC than she was on 1/16.   No change in pain level during session.  She continues to require short seated rest breaks throughout session due to fatigue and elevated back pain.  Pain reduces to baseline within 1-2 min.  Pt verbalized interest to finish PT through POC.  Voiced interest to transition her to independent strengthening routine at the gym at her retirement facility.  Will continue land-based appointments to progress balance and postural proprioception exercises.   OBJECTIVE IMPAIRMENTS Abnormal gait, decreased activity tolerance, decreased strength, increased muscle spasms, impaired flexibility, impaired sensation, improper body mechanics, postural dysfunction, and pain.   ACTIVITY LIMITATIONS standing, squatting, stairs, transfers, and locomotion level  PARTICIPATION LIMITATIONS: meal prep, cleaning, laundry, shopping, and community activity  PERSONAL FACTORS Past/current experiences and Time since onset of injury/illness/exacerbation are also affecting patient's functional outcome.     GOALS: Goals reviewed with patient? Yes  SHORT TERM GOALS: Target date: 11/05/2022  Pt will find an appropriate level of intensity in aquatic exercise for strengthening Baseline:has  Goal status: Achieved - 11/28  2.  Pt will verbalize awareness of postural alignment in seated and standing through ADLs Baseline:  Goal status: Improved - 01/27/23   LONG TERM GOALS: Target date: POC Date  Pt will meet stated MMT goals in chart Baseline: see chart Goal status: partially met but  still ongoing  2.  Pt will be able to ambulate household distances without AD and without increase in pain Baseline: petrified to be without walker Goal status: ongoing - walking short distances in household with SPC, but same pain level 01/27/23  3.  Average pain <=3/10 with ADLs Baseline: average is 5-6/10 Goal status: Ongoing 01/27/23  4.  Will tolerate community ambulation with AD less than walker Baseline: pt uses WC for community; RW/ [facility] in household  Goal status: ongoing -01/27/23  PLAN: PT FREQUENCY: 2x/week   PLANNED INTERVENTIONS: Therapeutic exercises,  Therapeutic activity, Neuromuscular re-education, Balance training, Gait training, Patient/Family education, Self Care, Joint mobilization, Stair training, Aquatic Therapy, Dry Needling, Electrical stimulation, Spinal mobilization, Cryotherapy, Moist heat, Taping, Manual therapy, and Re-evaluation.  PLAN FOR NEXT SESSION: Land appt: Soft tissue massage to thoracolumbar paraspinals if indicated , hamstring flexibility, continue to increase heel strike in gait, balance, stacking posture with weight in heels, core engagement  Kerin Perna, PTA 01/27/23 6:39 PM [facility] [place], [place], [address] Phone: [phone]   Fax:  [phone]  I attest to measures taken as correct and appropriate for progress note.  Jessica C. Hightower PT, DPT 02/02/23 8:12 AM

## 2023-02-01 ENCOUNTER — Encounter (HOSPITAL_BASED_OUTPATIENT_CLINIC_OR_DEPARTMENT_OTHER): Payer: Medicare Other | Admitting: Physical Therapy

## 2023-02-02 DIAGNOSIS — E039 Hypothyroidism, unspecified: Secondary | ICD-10-CM | POA: Diagnosis not present

## 2023-02-02 DIAGNOSIS — I10 Essential (primary) hypertension: Secondary | ICD-10-CM | POA: Diagnosis not present

## 2023-02-02 DIAGNOSIS — E785 Hyperlipidemia, unspecified: Secondary | ICD-10-CM | POA: Diagnosis not present

## 2023-02-02 DIAGNOSIS — R0602 Shortness of breath: Secondary | ICD-10-CM | POA: Diagnosis not present

## 2023-02-02 DIAGNOSIS — Z7901 Long term (current) use of anticoagulants: Secondary | ICD-10-CM | POA: Diagnosis not present

## 2023-02-02 DIAGNOSIS — R0789 Other chest pain: Secondary | ICD-10-CM | POA: Diagnosis not present

## 2023-02-02 DIAGNOSIS — Z86711 Personal history of pulmonary embolism: Secondary | ICD-10-CM | POA: Insufficient documentation

## 2023-02-02 DIAGNOSIS — I700 Atherosclerosis of aorta: Secondary | ICD-10-CM | POA: Diagnosis not present

## 2023-02-03 ENCOUNTER — Ambulatory Visit (HOSPITAL_BASED_OUTPATIENT_CLINIC_OR_DEPARTMENT_OTHER): Payer: Medicare Other | Admitting: Physical Therapy

## 2023-02-03 ENCOUNTER — Encounter (HOSPITAL_BASED_OUTPATIENT_CLINIC_OR_DEPARTMENT_OTHER): Payer: Self-pay | Admitting: Physical Therapy

## 2023-02-03 DIAGNOSIS — R262 Difficulty in walking, not elsewhere classified: Secondary | ICD-10-CM | POA: Diagnosis not present

## 2023-02-03 DIAGNOSIS — M6281 Muscle weakness (generalized): Secondary | ICD-10-CM

## 2023-02-03 DIAGNOSIS — M5459 Other low back pain: Secondary | ICD-10-CM | POA: Diagnosis not present

## 2023-02-03 NOTE — Therapy (Signed)
OUTPATIENT PHYSICAL THERAPY THORACOLUMBAR TREATMENT   Patient Name: Stephanie Baker MRN: [id number] DOB:1941/11/08, 82 y.o., female Today's Date: 02/03/2023   PT End of Session - 02/03/23 1408     Visit Number 20    Number of Visits 30    Date for PT Re-Evaluation 02/10/23    Authorization Type MCR    Progress Note Due on Visit 30    PT Start Time 1401    PT Stop Time 1440    PT Time Calculation (min) 39 min    Equipment Utilized During Treatment --    Behavior During Therapy WFL for tasks assessed/performed              Past Medical History:  Diagnosis Date   Arthritis    OA   GERD (gastroesophageal reflux disease)    Hematuria    idiopathic- [facility]   Hyperlipemia    Hypertension    Hypothyroidism    Past Surgical History:  Procedure Laterality Date   ABDOMINAL HYSTERECTOMY     APPENDECTOMY     BACK SURGERY     lumbar   EYE SURGERY     bilateral cataract extraction with IOL   GASTROC RECESSION EXTREMITY Left 02/23/2018   Procedure: Left Gastroc Recession;  Surgeon: Wylene Simmer, MD;  Location: [facility];  Service: Orthopedics;  Laterality: Left;   HAND SURGERY     left thumb MP   JOINT REPLACEMENT     right knee   PARTIAL KNEE ARTHROPLASTY  11/14/2012   Procedure: UNICOMPARTMENTAL KNEE;  Surgeon: Mauri Pole, MD;  Location: [facility];  Service: Orthopedics;  Laterality: Left;   TARSAL METATARSAL ARTHRODESIS Left 02/23/2018   Procedure: Left First and Second Tarsometatarsal Arthrodesis;  Surgeon: Wylene Simmer, MD;  Location: [facility];  Service: Orthopedics;  Laterality: Left;   thumb surgery     left   VAGINAL HYSTERECTOMY     Patient Active Problem List   Diagnosis Date Noted   Multiple subsegmental pulmonary emboli without acute cor pulmonale ([place]) 06/08/2022   Acute deep vein thrombosis (DVT) of femoral vein of right lower extremity ([place]) 06/08/2022   Acquired hypothyroidism 06/08/2022   Slow transit  constipation 06/08/2022   Spinal stenosis of thoracolumbar region 06/08/2022   Primary hypertension 06/08/2022   S/P left UKR 11/15/2012   Expected blood loss anemia 11/15/2012   Overweight (BMI 25.0-29.9) 11/15/2012     REFERRING PROVIDER: Burt Knack, PA-C  REFERRING DIAG:    Z98.1 (ICD-10-CM) - Arthrodesis status    Rationale for Evaluation and Treatment Rehabilitation  THERAPY DIAG:  Other low back pain  Muscle weakness (generalized)  Difficulty in walking, not elsewhere classified  ONSET DATE: approx early-mid 2022, surgery May 2023  SUBJECTIVE:  SUBJECTIVE STATEMENT: Pt reports she has been using 4 machines at gym: row, lat pulldown, leg press, and knee ext x 15 each, with light weights.  2 x /wk.  She reports her back continues to be painful.  She states she has not worked on walking with cane around house much.  She has a visit at [facility] regarding her back on 2/15, and sees a pulmonologist tomorrow for her "feeling out of breath" with exertion.     From EVAL: I can't walk. My husband passed away last 29-Oct-2023- I was having difficulty walking and back pain before then but put it off. Apr 28, 2022 has back surgery. My back still aches and I cannot walk without a walker. Balance is okay but I am really careful. Less intense pain when I sit and increases with standing. Both legs feel abnormal , almost numb but can feel my feet when walking. I am in more pain since surgery. PT at [facility] after surgery. Noted more frequent urination but no loss of control.   PERTINENT HISTORY:   Bil partial TKA, h/o DVT  Diagnosis:  S/p T11-L4 reinstrumentation, T12-L1 through L2-3 SPO's [facility] 04/28/22) 2. Hx Trans psoas fusion at L3-4 followed by unilateral posterior instrumentation. Her imaging however  shows that she has solid fusion of the L4-5 and L5-S1 levels. This could represent a congenital fusion but the overall appearance is more consistent with a previous fusion at those levels    PAIN:  Are you having pain? Yes: NPRS scale:5/10 Pain location: midline lower back;  Rt sacral border  Pain description: constant with fluctuation in intensity Aggravating factors: standing/walking Relieving factors: sit/lay   PRECAUTIONS: None  WEIGHT BEARING RESTRICTIONS No  FALLS:  Has patient fallen in last 6 months? No  LIVING ENVIRONMENT:  Lives in: Other [facility] Has following equipment at home: Environmental consultant - 2 wheeled and Wheelchair (power)  OCCUPATION: retired PT  PLOF: Independent  PATIENT GOALS walk independently, decrease pain   OBJECTIVE:   PATIENT SURVEYS:  FOTO EVAL: 46 12/02/22: 35 1/11: 38  MUSCLE LENGTH: EVAL: Unable to fully extend either knee in seated 12/02/22: Rt -5, Lt 0 01/06/22 Rt knee ext -2  POSTURE:  EVAL: post pelvic tilt with decreased lordosis 12/02/22: stands with hips shifted anteriorly and post balance of shoulders 1/11 hips slightly anterior to shoulders but improved from last visit with me.    LOWER EXTREMITY MMT:    MMT (age norm goal) Right eval Left eval Rt/Lt 12/7 Rt/Lt 1/11 Rt/Lt 01/27/23   Hip flexion (21) 19.5lb 16.7 21.6/17.4 28/22 27.4/19.7  Hip extension       Hip abduction       Hip adduction       Hip internal rotation       Hip external rotation       Knee flexion 10.7 9.2 16.1/17.6 39.5/18.7 34.3/21.0  Knee extension (46) 20.7 23.3 27.2/ 25.1 28/32 22.3/25.3  Ankle dorsiflexion       Ankle plantarflexion       Ankle inversion       Ankle eversion        (Blank rows = not tested)   FUNCTIONAL TESTS:  5 times sit to stand: EVAL: 14s 12/02/22: 16s 01/06/23: 10s  GAIT: Distance walked: to clinic and to<>from pool Assistive device utilized: Environmental consultant - 2 wheeled Level of assistance: Modified independence Comments:  demonstrated heel toe pattern that is minimal but notable, flexed through thoracic spine, increased cadence    TODAY'S TREATMENT  Treatment                            02/03/23: Therapeutic exercise: *at counter with UE support:  tandem gait forward/backward; bilat shoulder flexion wall slides x 10 for shoulder/back stretch; hip abdct/ add x 10; hip ext x 10; heel raises x 10; side lunge and reach; tandem gait forward/ backward;  * toe taps to 6" with UE on counter;  forward step ups with UE on counter x 5 each LE (fatigues quickly) * Seated bilat row with red band x 15 * seated hamstring stretch x 20s x 2; fig 4 stretch x 20s  * standing on airex pad:  NBOS without UE support;  marching with intermittent UE support  Gait:   * with SPC in Lt hand and SBA trials cues for posture and pacing;- 275f , 145 ft   Treatment                            01/27/23: Objective measures (MMT)  Therapeutic exercise: * marching at counter; bilat shoulder flexion wall slides (at counter) x 5 for shoulder/back stretch * on airex pad: NBOS balance with/ without horiz/ vertical head turns; wide stance with R/L weight shifts with overhead side reach, outside BOS * Seated bilat row with green band -> red band x 10 * Standing bilat shoulder ext with red band x 10 with CGA * seated hamstring stretch x 30s x 2; fig 4 stretch x 20s x 2 RLE, x 1 LLE  Gait:   * with SPC in Lt hand and SBA trials cues for posture and pacing;- 50 ft, 165 ft, 145 ft  Vitals: 96-97% spO2 and up to 102 bpm during gait, returned to 82bpm within 1 min.   Treatment                            01/25/23: Manual: In Lt sidelying - STM and TPR to Rt piriformis with contract relax of muscle; STM to Rt glute max, med and lower back paraspinals to decrease fascial restrictions and pain, and improve mobility  Therapeutic exercise: * Rt sidelying clams x 10 * STS from elevated table x 8 * Holding counter: Heel raises x 10; side to side lunge for  hip stretch;   Hip ext x 5 x 2; hip abdct x 10 each * near counter:  semi-tandem stance x 20 sec each LE forward * bilat shoulder flexion wall slides (at counter) x 5 for shoulder/back stretch  Gait:   * with SPC in Lt hand and SBA trials cues for posture and pacing;- 124 ft,  70 ft; 130 ft  Vitals: At rest 2 min after first trial 97%, HR 77;  During 2nd gait trial:  103 BPM, 96%    Treatment                            01/18/23: THERAUPTIC Ex: * NuStep, L2 x 4 min, UE @ 9 for warm up * bilat shoulder flexion wall slides (at counter) x 5 for shoulder/back stretch * BUE on counter: side to side lunge for adductor stretch x 5 each;  heel raises x 10 * seated hamstring stretch x 15s x 2 reps each LE *Standing row with same side step back x 10 with RLE, and staggered  stance and Lt row x 10 * holding counter:  hip ext to toe touch x 10  GAIT: * with SPC in Lt hand and CGA trials: 100 ft; 45 ft - standing rest - then 116 additional ft (spread out throughout session)  with cues for posture   Treatment                            01/13/23: GAIT: * with SPC in Lt hand and CGA trials: 129 ft; 89 ft; 138f (spread out throughout session)  with cues for posture,pacing, placement of cane * forward/backward gait with Rt hand on counter, Lt hand on SPC, cues for even stance time; high knee marching in place; side stepping  THERAUPTIC Ex: * BUE on counter: heel raises x 10, hip ext to toe touch x 10, hip abdct with toe touch x 10 each   *Standing bilat shoulder ext with red band (SBA) x 10  * STS without UE with eccentric lowering x 10 * bilat shoulder flexion wall slides (at counter) x 5 for shoulder/back stretch * seated hamstring stretch x 30s x 3 reps each LE   Treatment                            01/11/23: THERAUPTIC Ex:  * NuStep: L2-3 (seat 8, L3, UE at 10) x 5 min  * seated quad stretch ( foot under chair) x 20s  x 2 reps each side * seated hamstring stretch x 20s x 3 reps * STS without UE  with eccentric lowering x 8 * Holding counter:  hip abdct/addct x 10 x 2 (cues for forward foot position); heel raises (tactile cues to correct posture/shoulders over hips) x 10; 3 way toe tap x 5 each side  * holding counter:  SLS x 10 sec * without support:  semi tandem stance x 20 sec each  GAIT: * At counter:  marching with UE support; single UE on counter backward/ forward gait (limited step length observed when Rt hand on counter); side stepping R/L  * with SPC in Lt hand and CGA, 20 ft trials with cues for posture, heel strike and placement of cane    PATIENT EDUCATION:  Education details: posture and body mechanics with exercises Person educated: Patient Education method: Explanation, Demonstration, Tactile cues, Verbal cues Education comprehension: verbalized understanding, returned demonstration, verbal cues required, tactile cues required, and needs further education   HOME EXERCISE PROGRAM: [id number]  ASSESSMENT:  CLINICAL IMPRESSION Pt able to walk further distances with SPC than last visit.  Vitals monitored throughout; HR 78-98bpm and spO2 96-97%.   No change in pain level during session.  She continues to require short seated rest breaks throughout session due to fatigue and elevated back pain.  Pain reduces to baseline within 1-2 min.  Voiced interest to transition her to independent strengthening routine at the gym at her retirement facility.  PT to assess goals and finalize HEP at next/last visit.   OBJECTIVE IMPAIRMENTS Abnormal gait, decreased activity tolerance, decreased strength, increased muscle spasms, impaired flexibility, impaired sensation, improper body mechanics, postural dysfunction, and pain.   ACTIVITY LIMITATIONS standing, squatting, stairs, transfers, and locomotion level  PARTICIPATION LIMITATIONS: meal prep, cleaning, laundry, shopping, and community activity  PERSONAL FACTORS Past/current experiences and Time since onset of  injury/illness/exacerbation are also affecting patient's functional outcome.     GOALS: Goals reviewed with patient? Yes  SHORT TERM GOALS: Target date: 11/05/2022  Pt will find an appropriate level of intensity in aquatic exercise for strengthening Baseline:has  Goal status: Achieved - 11/28  2.  Pt will verbalize awareness of postural alignment in seated and standing through ADLs Baseline:  Goal status: Improved - 01/27/23   LONG TERM GOALS: Target date: POC Date  Pt will meet stated MMT goals in chart Baseline: see chart Goal status: partially met but still ongoing  2.  Pt will be able to ambulate household distances without AD and without increase in pain Baseline: petrified to be without walker Goal status: ongoing - walking short distances in household with SPC, but same pain level 01/27/23  3.  Average pain <=3/10 with ADLs Baseline: average is 5-6/10 Goal status: Ongoing 01/27/23  4.  Will tolerate community ambulation with AD less than walker Baseline: pt uses WC for community; RW/ [facility] in household  Goal status: ongoing -01/27/23  PLAN: PT FREQUENCY: 2x/week   PLANNED INTERVENTIONS: Therapeutic exercises, Therapeutic activity, Neuromuscular re-education, Balance training, Gait training, Patient/Family education, Self Care, Joint mobilization, Stair training, Aquatic Therapy, Dry Needling, Electrical stimulation, Spinal mobilization, Cryotherapy, Moist heat, Taping, Manual therapy, and Re-evaluation.  PLAN FOR NEXT SESSION: [place] appt: Soft tissue massage to thoracolumbar paraspinals if indicated , hamstring flexibility, continue to increase heel strike in gait, balance, stacking posture with weight in heels, core engagement  Kerin Perna, PTA 02/03/23 6:20 PM [facility] [place], [place], [address] Phone: [phone]   Fax:  [phone]

## 2023-02-04 ENCOUNTER — Encounter: Payer: Self-pay | Admitting: Cardiology

## 2023-02-04 ENCOUNTER — Ambulatory Visit: Payer: Medicare Other | Admitting: Cardiology

## 2023-02-04 VITALS — BP 151/73 | HR 74 | Resp 16 | Ht 64.0 in | Wt 137.0 lb

## 2023-02-04 DIAGNOSIS — R0609 Other forms of dyspnea: Secondary | ICD-10-CM

## 2023-02-04 DIAGNOSIS — Z87891 Personal history of nicotine dependence: Secondary | ICD-10-CM

## 2023-02-04 DIAGNOSIS — I2510 Atherosclerotic heart disease of native coronary artery without angina pectoris: Secondary | ICD-10-CM | POA: Diagnosis not present

## 2023-02-04 DIAGNOSIS — Z789 Other specified health status: Secondary | ICD-10-CM

## 2023-02-04 DIAGNOSIS — I700 Atherosclerosis of aorta: Secondary | ICD-10-CM | POA: Diagnosis not present

## 2023-02-04 DIAGNOSIS — R072 Precordial pain: Secondary | ICD-10-CM

## 2023-02-04 DIAGNOSIS — Z86711 Personal history of pulmonary embolism: Secondary | ICD-10-CM

## 2023-02-04 DIAGNOSIS — I10 Essential (primary) hypertension: Secondary | ICD-10-CM

## 2023-02-04 DIAGNOSIS — Z86718 Personal history of other venous thrombosis and embolism: Secondary | ICD-10-CM

## 2023-02-04 DIAGNOSIS — E782 Mixed hyperlipidemia: Secondary | ICD-10-CM

## 2023-02-04 MED ORDER — NEXLIZET 180-10 MG PO TABS: 1.0000 | ORAL_TABLET | Freq: Every day | ORAL | 0 refills | Status: DC

## 2023-02-04 MED ORDER — NITROGLYCERIN 0.4 MG SL SUBL: 0.4000 mg | SUBLINGUAL_TABLET | SUBLINGUAL | 0 refills | Status: DC | PRN

## 2023-02-04 NOTE — Progress Notes (Signed)
ID:  [name], DOB August 15, 1941, MRN [id number]  PCP:  Donnajean Lopes, MD  Cardiologist:  Rex Kras, DO, [facility] (established care 02/04/2023)  REASON FOR CONSULT: Chest Pressure  REQUESTING PHYSICIAN:  Donnajean Lopes, MD [address] [place],  [place] [address]  Chief Complaint  Patient presents with   Chest Pain   New Patient (Initial Visit)    HPI  Stephanie Baker is a 82 y.o. Caucasian female who presents to the clinic for evaluation of chest pressure at the request of Donnajean Lopes, MD. Her past medical history and cardiovascular risk factors include: History of coronary and aortic atherosclerosis (nongated CT study 2019), hyperlipidemia, history of intolerance to statin, former smoker (25-pack-year history, quit 33 years ago), history of PE / DVT(postoperative May 2023, currently on anticoagulation), history of pericarditis, hypertension.   Patient presents today for evaluation of chest pressure.  Symptoms ongoing for the last 3 to 4 months.  Substernally located, constant (throughout the day without relief), intensity 3 out of 10, not worsened with exertion (physical activity is limited, walks with a walker), some relief with resting.  Associated symptoms also include dyspnea on exertion.  After her back surgery in May 2023 she was diagnosed with a PE for which she is on blood thinners and which are being managed by hematology per patient.  She has a history of statin intolerance but does not recall which statin she has tried.  Patient is a retired Community education officer and her husband was Doctor, general practice within the community.  FUNCTIONAL STATUS: No structured exercise program or daily routine.   ALLERGIES: Allergies  Allergen Reactions   Statins     MEDICATION LIST PRIOR TO VISIT: Current Meds  Medication Sig   amLODipine (NORVASC) 2.5 MG tablet Take 1 tablet (2.5 mg total) by mouth daily before breakfast.   Bempedoic Acid-Ezetimibe  (NEXLIZET) 180-10 MG TABS Take 1 tablet by mouth daily at 12 noon.   Biotin 5000 MCG CAPS Take 1 capsule by mouth daily. Place 1 tablet under the tongue once daily   Calcium Citrate-Vitamin D (CALCIUM CITRATE + D) 315-5 MG-MCG TABS Take 2 tablets by mouth in the morning and at bedtime.   ELIQUIS 5 MG TABS tablet TAKE 1 TABLET(5 MG) BY MOUTH TWICE DAILY   Evening Primrose Oil CAPS Take 1 capsule by mouth daily at 12 noon.   levothyroxine (SYNTHROID) 25 MCG tablet Take 2 tablets (50 mcg total) by mouth daily before breakfast.   Magnesium Citrate 100 MG TABS Take 150 mg by mouth every morning.   Multiple Vitamins-Minerals (MULTIVITAMIN WITH MINERALS) tablet Take 1 tablet by mouth daily.   nitroGLYCERIN (NITROSTAT) 0.4 MG SL tablet Place 1 tablet (0.4 mg total) under the tongue every 5 (five) minutes as needed for chest pain. If you require more than two tablets five minutes apart go to the nearest ER via EMS.   olmesartan-hydrochlorothiazide (BENICAR HCT) 40-12.5 MG tablet Take 1 tablet by mouth daily before breakfast. For HTN   omega-3 acid ethyl esters (LOVAZA) 1 g capsule Take 2 g by mouth 3 (three) times daily.   potassium chloride SA (KLOR-CON M) 20 MEQ tablet Take 1 tablet (20 mEq total) by mouth daily before breakfast.   RESTASIS 0.05 % ophthalmic emulsion Place 1 drop into both eyes 2 (two) times daily.   Turmeric 500 MG CAPS Take 500 mg by mouth daily.     PAST MEDICAL HISTORY: Past Medical History:  Diagnosis Date   Arthritis  OA   GERD (gastroesophageal reflux disease)    Hematuria    idiopathic- [facility]   Hyperlipemia    Hypertension    Hypothyroidism     PAST SURGICAL HISTORY: Past Surgical History:  Procedure Laterality Date   ABDOMINAL HYSTERECTOMY     APPENDECTOMY     BACK SURGERY     lumbar   EYE SURGERY     bilateral cataract extraction with IOL   GASTROC RECESSION EXTREMITY Left 02/23/2018   Procedure: Left Gastroc Recession;  Surgeon: Wylene Simmer, MD;  Location: [facility];  Service: Orthopedics;  Laterality: Left;   HAND SURGERY     left thumb MP   JOINT REPLACEMENT     right knee   PARTIAL KNEE ARTHROPLASTY  11/14/2012   Procedure: UNICOMPARTMENTAL KNEE;  Surgeon: Mauri Pole, MD;  Location: [facility];  Service: Orthopedics;  Laterality: Left;   TARSAL METATARSAL ARTHRODESIS Left 02/23/2018   Procedure: Left First and Second Tarsometatarsal Arthrodesis;  Surgeon: Wylene Simmer, MD;  Location: [facility];  Service: Orthopedics;  Laterality: Left;   thumb surgery     left   VAGINAL HYSTERECTOMY      FAMILY HISTORY: The patient family history includes Alcoholism in her brother; Breast cancer in her maternal aunt; Breast cancer (age of onset: 74) in her sister; Healthy in her brother and father; Heart attack in her mother.  SOCIAL HISTORY:  The patient  reports that she quit smoking about 34 years ago. Her smoking use included cigarettes. She has never used smokeless tobacco. She reports current alcohol use of about 7.0 standard drinks of alcohol per week. She reports that she does not use drugs.  REVIEW OF SYSTEMS: Review of Systems  Cardiovascular:  Positive for chest pain (pressure - see HPI) and dyspnea on exertion. Negative for claudication, irregular heartbeat, leg swelling, near-syncope, orthopnea, palpitations, paroxysmal nocturnal dyspnea and syncope.  Respiratory:  Negative for shortness of breath.   Hematologic/Lymphatic: Negative for bleeding problem.  Musculoskeletal:  Negative for muscle cramps and myalgias.  Neurological:  Negative for dizziness and light-headedness.    PHYSICAL EXAM:    02/04/2023    8:34 AM 06/08/2022   11:10 AM 06/03/2022   10:49 AM  Vitals with BMI  Height 5' 4"$  5' 4"$  5' 4"$   Weight 137 lbs 124 lbs 3 oz 123 lbs 13 oz  BMI 23.5 [id number]  Systolic [id number]  Diastolic 73 57 67  Pulse 74 61 64    Physical Exam  Constitutional: No distress.  Age  appropriate, hemodynamically stable, walks w/ walker  Neck: No JVD present.  Cardiovascular: Normal rate, regular rhythm, S1 normal, S2 normal, intact distal pulses and normal pulses. Exam reveals no gallop, no S3 and no S4.  Murmur heard. Holosystolic murmur is present with a grade of 3/6 at the apex. Pulmonary/Chest: Effort normal and breath sounds normal. No stridor. She has no wheezes. She has no rales.  Abdominal: Soft. Bowel sounds are normal. She exhibits no distension. There is no abdominal tenderness.  Musculoskeletal:        General: No edema.     Cervical back: Neck supple.  Neurological: She is alert and oriented to person, place, and time. She has intact cranial nerves (2-12).  Skin: Skin is warm and moist.  Easy bruising    CARDIAC DATABASE: EKG: 02/04/2023: Sinus rhythm, 65 bpm, left axis, TWI in leads V1 and V2, without underlying injury pattern.  Echocardiogram: 08/2018:  Mild LVH, LVEF 60-65%, aortic valve sclerosis, moderate AI, mild MAC, mild MR, mild TR, mild PR.  Stress Testing: MPI September 2019: Per report stress ECG negative for ischemia.  No ischemia or previous infarction.  Heart Catheterization: None  LABORATORY DATA:    Latest Ref Rng & Units 05/17/2022   12:00 AM 11/15/2012    4:25 AM 11/01/2012   10:10 AM  CBC  WBC  4.5     11.4  4.8   Hemoglobin 12.0 - 16.0 10.7     11.9  14.1   Hematocrit 36 - 46 31     34.5  41.2   Platelets 150 - 400 K/uL 423     264  301      This result is from an external source.       Latest Ref Rng & Units 06/03/2022   12:00 AM 05/17/2022   12:00 AM 02/21/2018    2:00 PM  CMP  Glucose 65 - 99 mg/dL   120   BUN 4 - 21 29     30     19   $ Creatinine 0.5 - 1.1 0.5     0.5     0.68   Sodium 137 - 147 139     137     140   Potassium 3.5 - 5.1 mEq/L 3.9     3.9     3.9   Chloride 99 - 108 105     104     106   CO2 13 - 22 25     23     25   $ Calcium 8.7 - 10.7 9.9     9.5     9.3   Alkaline Phos 25 - 125 97     118        AST 13 - 35 18     12       ALT 7 - 35 U/L 16     8          This result is from an external source.    Lipid Panel  No results found for: "CHOL", "TRIG", "HDL", "CHOLHDL", "VLDL", "LDLCALC", "LDLDIRECT", "LABVLDL"  No components found for: "NTPROBNP" No results for input(s): "PROBNP" in the last 8760 hours. No results for input(s): "TSH" in the last 8760 hours.  BMP Recent Labs    05/17/22 0000 06/03/22 0000  NA 137 139  K 3.9 3.9  CL 104 105  CO2 23* 25*  BUN 30* 29*  CREATININE 0.5 0.5  CALCIUM 9.5 9.9    HEMOGLOBIN A1C No results found for: "HGBA1C", "MPG"  External Labs: Collected: 07/19/2022 provided by referring physician Total cholesterol 209, triglycerides 73, HDL 85, LDL 109, non-HDL 124 TSH 3.0 BUN 21, creatinine 0.6. Sodium 139, potassium 3.9, chloride 108, bicarb 25. AST 19, ALT 16, alkaline phosphatase 72  IMPRESSION:    ICD-10-CM   1. Precordial pain  R07.2 EKG 12-Lead    PCV ECHOCARDIOGRAM COMPLETE    PCV MYOCARDIAL PERFUSION WITH LEXISCAN    nitroGLYCERIN (NITROSTAT) 0.4 MG SL tablet    2. Calcification of native coronary artery  I25.10 PCV ECHOCARDIOGRAM COMPLETE   I25.84 PCV MYOCARDIAL PERFUSION WITH LEXISCAN    Bempedoic Acid-Ezetimibe (NEXLIZET) 180-10 MG TABS    3. Atherosclerosis of aorta (HCC)  I70.0 PCV ECHOCARDIOGRAM COMPLETE    PCV MYOCARDIAL PERFUSION WITH LEXISCAN    Bempedoic Acid-Ezetimibe (NEXLIZET) 180-10 MG TABS    4. Mixed hyperlipidemia  E78.2  5. Benign hypertension  I10     6. Statin intolerance  Z78.9 Bempedoic Acid-Ezetimibe (NEXLIZET) 180-10 MG TABS    7. History of DVT (deep vein thrombosis)  Z86.718     8. Hx pulmonary embolism  Z86.711     9. Former smoker  Z87.891     4. Dyspnea on exertion  0000000 Basic metabolic panel    Magnesium    Pro b natriuretic peptide (BNP)       RECOMMENDATIONS: Stephanie Baker is a 21 y.o. Caucasian female whose past medical history and cardiac risk factors  include: History of coronary and aortic atherosclerosis (nongated CT study 2019), hyperlipidemia, history of intolerance to statin, former smoker (25-pack-year history, quit 33 years ago), history of PE / DVT(postoperative May 2023, currently on anticoagulation), history of pericarditis, hypertension.   Precordial pain Calcification of native coronary artery Atherosclerosis of aorta (HCC) Precordial pain concerning for possible cardiac etiology. Echo will be ordered to evaluate for structural heart disease and left ventricular systolic function. Plan pharmacological stress is as she is unable to exercise as she ambulates with a walker. Sublingual nitroglycerin tablets to use on appearing basis.  Medication profile discussed. Has been intolerant to statin therapy in the past; however, given the severity of atherosclerosis as of imaging in 2019 recommend pharmacological therapy with either nonstatin medications versus PCSK9 inhibitors/Leqvio.  Patient chooses to try oral medications.  Samples for Nexlizet provided on prescription sent to the pharmacy. Educated on seeking medical attention sooner by going to the closest ER via EMS if the symptoms increase in intensity, frequency, duration, or has typical chest pain as discussed in the office.  Patient verbalized understanding.  Dyspnea on exertion Not in overt heart failure. May be secondary to elevated blood pressures at baseline. Will check BNP, BMP, magnesium. Echo will be ordered to evaluate for structural heart disease and left ventricular systolic function.  Mixed hyperlipidemia Statin intolerance Currently not on any statins or pharmacological therapy given her history of intolerance. Recommend goal LDL <55 mg/dL Start Nexlizet as discussed above  Benign hypertension Blood pressures are not well-controlled at the office visit. Patient states that she has whitecoat hypertension. Medications reconciled. Reemphasized the importance of  blood pressure monitoring at home. Currently managed by primary care provider.  History of DVT (deep vein thrombosis) / Hx pulmonary embolism Currently on anticoagulation managed by hematology per patient. Provoked after back surgery in May 2023  Data Reviewed: I have independently reviewed external notes provided by the referring provider as part of this office visit.   I have independently reviewed results of Labs, prior echo/stress test results, EKG as part of medical decision making. I have ordered the following tests:  Orders Placed This Encounter  Procedures   Basic metabolic panel   Magnesium   Pro b natriuretic peptide (BNP)   PCV MYOCARDIAL PERFUSION WITH LEXISCAN    Standing Status:   Future    Standing Expiration Date:   02/05/2024   EKG 12-Lead   PCV ECHOCARDIOGRAM COMPLETE    Standing Status:   Future    Standing Expiration Date:   02/05/2024  I have made medications changes at today's encounter as noted above.  FINAL MEDICATION LIST END OF ENCOUNTER: Meds ordered this encounter  Medications   Bempedoic Acid-Ezetimibe (NEXLIZET) 180-10 MG TABS    Sig: Take 1 tablet by mouth daily at 12 noon.    Dispense:  90 tablet    Refill:  0   nitroGLYCERIN (NITROSTAT) 0.4 MG SL  tablet    Sig: Place 1 tablet (0.4 mg total) under the tongue every 5 (five) minutes as needed for chest pain. If you require more than two tablets five minutes apart go to the nearest ER via EMS.    Dispense:  30 tablet    Refill:  0    Medications Discontinued During This Encounter  Medication Reason   cholecalciferol (VITAMIN D3) 25 MCG (1000 UNIT) tablet Patient Preference   influenza vaccine adjuvanted (FLUAD QUADRIVALENT) 0.5 ML injection    oxyCODONE (OXY IR/ROXICODONE) 5 MG immediate release tablet Discontinued by provider   sennosides-docusate sodium (SENOKOT-S) 8.6-50 MG tablet    simethicone (MYLICON) 0000000 MG chewable tablet Patient Preference   Vitamin D, Ergocalciferol, (DRISDOL) 50000  UNITS CAPS Patient Preference     Current Outpatient Medications:    amLODipine (NORVASC) 2.5 MG tablet, Take 1 tablet (2.5 mg total) by mouth daily before breakfast., Disp: 30 tablet, Rfl: 0   Bempedoic Acid-Ezetimibe (NEXLIZET) 180-10 MG TABS, Take 1 tablet by mouth daily at 12 noon., Disp: 90 tablet, Rfl: 0   Biotin 5000 MCG CAPS, Take 1 capsule by mouth daily. Place 1 tablet under the tongue once daily, Disp: , Rfl:    Calcium Citrate-Vitamin D (CALCIUM CITRATE + D) 315-5 MG-MCG TABS, Take 2 tablets by mouth in the morning and at bedtime., Disp: , Rfl:    ELIQUIS 5 MG TABS tablet, TAKE 1 TABLET(5 MG) BY MOUTH TWICE DAILY, Disp: 60 tablet, Rfl: 1   Evening Primrose Oil CAPS, Take 1 capsule by mouth daily at 12 noon., Disp: , Rfl:    levothyroxine (SYNTHROID) 25 MCG tablet, Take 2 tablets (50 mcg total) by mouth daily before breakfast., Disp: 30 tablet, Rfl: 0   Magnesium Citrate 100 MG TABS, Take 150 mg by mouth every morning., Disp: , Rfl:    Multiple Vitamins-Minerals (MULTIVITAMIN WITH MINERALS) tablet, Take 1 tablet by mouth daily., Disp: , Rfl:    nitroGLYCERIN (NITROSTAT) 0.4 MG SL tablet, Place 1 tablet (0.4 mg total) under the tongue every 5 (five) minutes as needed for chest pain. If you require more than two tablets five minutes apart go to the nearest ER via EMS., Disp: 30 tablet, Rfl: 0   olmesartan-hydrochlorothiazide (BENICAR HCT) 40-12.5 MG tablet, Take 1 tablet by mouth daily before breakfast. For HTN, Disp: 30 tablet, Rfl: 0   omega-3 acid ethyl esters (LOVAZA) 1 g capsule, Take 2 g by mouth 3 (three) times daily., Disp: , Rfl:    potassium chloride SA (KLOR-CON M) 20 MEQ tablet, Take 1 tablet (20 mEq total) by mouth daily before breakfast., Disp: 30 tablet, Rfl: 0   RESTASIS 0.05 % ophthalmic emulsion, Place 1 drop into both eyes 2 (two) times daily., Disp: , Rfl:    Turmeric 500 MG CAPS, Take 500 mg by mouth daily., Disp: , Rfl:    acetaminophen (TYLENOL) 500 MG tablet, Take  1,000 mg by mouth 2 (two) times daily as needed for mild pain., Disp: , Rfl:    ketoconazole (NIZORAL) 2 % shampoo, Apply 1 Application topically 2 (two) times a week., Disp: , Rfl:   Orders Placed This Encounter  Procedures   Basic metabolic panel   Magnesium   Pro b natriuretic peptide (BNP)   PCV MYOCARDIAL PERFUSION WITH LEXISCAN   EKG 12-Lead   PCV ECHOCARDIOGRAM COMPLETE    There are no Patient Instructions on file for this visit.   --Continue cardiac medications as reconciled in final medication list. --Return in  about 4 weeks (around 03/04/2023) for Reevaluation of, Chest pain. or sooner if needed. --Continue follow-up with your primary care physician regarding the management of your other chronic comorbid conditions.  Patient's questions and concerns were addressed to her satisfaction. She voices understanding of the instructions provided during this encounter.   This note was created using a voice recognition software as a result there may be grammatical errors inadvertently enclosed that do not reflect the nature of this encounter. Every attempt is made to correct such errors.  Rex Kras, [place], [facility]  Pager: [phone] Office: [phone]

## 2023-02-05 LAB — PRO B NATRIURETIC PEPTIDE: NT-Pro BNP: 135 pg/mL (ref 0–738)

## 2023-02-05 LAB — BASIC METABOLIC PANEL
BUN/Creatinine Ratio: 47 — ABNORMAL HIGH (ref 12–28)
BUN: 26 mg/dL (ref 8–27)
CO2: 23 mmol/L (ref 20–29)
Calcium: 9.4 mg/dL (ref 8.7–10.3)
Chloride: 104 mmol/L (ref 96–106)
Creatinine, Ser: 0.55 mg/dL — ABNORMAL LOW (ref 0.57–1.00)
Glucose: 100 mg/dL — ABNORMAL HIGH (ref 70–99)
Potassium: 3.8 mmol/L (ref 3.5–5.2)
Sodium: 140 mmol/L (ref 134–144)
eGFR: 91 mL/min/{1.73_m2} (ref >59)

## 2023-02-05 LAB — MAGNESIUM: Magnesium: 2 mg/dL (ref 1.6–2.3)

## 2023-02-08 ENCOUNTER — Encounter (HOSPITAL_BASED_OUTPATIENT_CLINIC_OR_DEPARTMENT_OTHER): Payer: Medicare Other | Admitting: Physical Therapy

## 2023-02-10 ENCOUNTER — Encounter (HOSPITAL_BASED_OUTPATIENT_CLINIC_OR_DEPARTMENT_OTHER): Payer: Medicare Other | Admitting: Physical Therapy

## 2023-02-10 DIAGNOSIS — M47816 Spondylosis without myelopathy or radiculopathy, lumbar region: Secondary | ICD-10-CM | POA: Diagnosis not present

## 2023-02-10 DIAGNOSIS — Z87891 Personal history of nicotine dependence: Secondary | ICD-10-CM | POA: Diagnosis not present

## 2023-02-10 DIAGNOSIS — Z981 Arthrodesis status: Secondary | ICD-10-CM | POA: Diagnosis not present

## 2023-02-10 DIAGNOSIS — M48061 Spinal stenosis, lumbar region without neurogenic claudication: Secondary | ICD-10-CM | POA: Diagnosis not present

## 2023-02-10 DIAGNOSIS — M533 Sacrococcygeal disorders, not elsewhere classified: Secondary | ICD-10-CM | POA: Diagnosis not present

## 2023-02-10 DIAGNOSIS — M47814 Spondylosis without myelopathy or radiculopathy, thoracic region: Secondary | ICD-10-CM | POA: Diagnosis not present

## 2023-02-10 DIAGNOSIS — M4804 Spinal stenosis, thoracic region: Secondary | ICD-10-CM | POA: Diagnosis not present

## 2023-02-10 DIAGNOSIS — M4807 Spinal stenosis, lumbosacral region: Secondary | ICD-10-CM | POA: Diagnosis not present

## 2023-02-10 DIAGNOSIS — M47817 Spondylosis without myelopathy or radiculopathy, lumbosacral region: Secondary | ICD-10-CM | POA: Diagnosis not present

## 2023-02-10 DIAGNOSIS — M461 Sacroiliitis, not elsewhere classified: Secondary | ICD-10-CM | POA: Diagnosis not present

## 2023-02-11 ENCOUNTER — Ambulatory Visit: Payer: Medicare Other

## 2023-02-11 DIAGNOSIS — R072 Precordial pain: Secondary | ICD-10-CM

## 2023-02-11 DIAGNOSIS — I700 Atherosclerosis of aorta: Secondary | ICD-10-CM

## 2023-02-11 DIAGNOSIS — I2510 Atherosclerotic heart disease of native coronary artery without angina pectoris: Secondary | ICD-10-CM

## 2023-02-11 DIAGNOSIS — I2584 Coronary atherosclerosis due to calcified coronary lesion: Secondary | ICD-10-CM | POA: Diagnosis not present

## 2023-02-22 ENCOUNTER — Ambulatory Visit: Payer: Medicare Other

## 2023-02-22 DIAGNOSIS — I2584 Coronary atherosclerosis due to calcified coronary lesion: Secondary | ICD-10-CM | POA: Diagnosis not present

## 2023-02-22 DIAGNOSIS — R072 Precordial pain: Secondary | ICD-10-CM | POA: Diagnosis not present

## 2023-02-22 DIAGNOSIS — I700 Atherosclerosis of aorta: Secondary | ICD-10-CM

## 2023-02-22 DIAGNOSIS — I2510 Atherosclerotic heart disease of native coronary artery without angina pectoris: Secondary | ICD-10-CM | POA: Diagnosis not present

## 2023-02-24 ENCOUNTER — Ambulatory Visit (HOSPITAL_BASED_OUTPATIENT_CLINIC_OR_DEPARTMENT_OTHER): Payer: Self-pay | Admitting: Physical Therapy

## 2023-02-24 DIAGNOSIS — M461 Sacroiliitis, not elsewhere classified: Secondary | ICD-10-CM | POA: Diagnosis not present

## 2023-03-11 ENCOUNTER — Ambulatory Visit: Payer: Medicare Other | Admitting: Cardiology

## 2023-03-11 ENCOUNTER — Other Ambulatory Visit: Payer: Self-pay | Admitting: Cardiology

## 2023-03-11 ENCOUNTER — Encounter: Payer: Self-pay | Admitting: Cardiology

## 2023-03-11 VITALS — BP 147/71 | HR 82 | Resp 16 | Ht 64.0 in | Wt 136.4 lb

## 2023-03-11 DIAGNOSIS — R072 Precordial pain: Secondary | ICD-10-CM | POA: Diagnosis not present

## 2023-03-11 DIAGNOSIS — I700 Atherosclerosis of aorta: Secondary | ICD-10-CM

## 2023-03-11 DIAGNOSIS — I2510 Atherosclerotic heart disease of native coronary artery without angina pectoris: Secondary | ICD-10-CM

## 2023-03-11 DIAGNOSIS — Z86711 Personal history of pulmonary embolism: Secondary | ICD-10-CM

## 2023-03-11 DIAGNOSIS — R0609 Other forms of dyspnea: Secondary | ICD-10-CM

## 2023-03-11 DIAGNOSIS — E782 Mixed hyperlipidemia: Secondary | ICD-10-CM

## 2023-03-11 DIAGNOSIS — Z86718 Personal history of other venous thrombosis and embolism: Secondary | ICD-10-CM | POA: Diagnosis not present

## 2023-03-11 DIAGNOSIS — I10 Essential (primary) hypertension: Secondary | ICD-10-CM

## 2023-03-11 DIAGNOSIS — I2584 Coronary atherosclerosis due to calcified coronary lesion: Secondary | ICD-10-CM | POA: Diagnosis not present

## 2023-03-11 DIAGNOSIS — Z87891 Personal history of nicotine dependence: Secondary | ICD-10-CM | POA: Diagnosis not present

## 2023-03-11 DIAGNOSIS — Z789 Other specified health status: Secondary | ICD-10-CM | POA: Diagnosis not present

## 2023-03-11 MED ORDER — AMLODIPINE BESYLATE 5 MG PO TABS: 5.0000 mg | ORAL_TABLET | Freq: Every day | ORAL | 0 refills | Status: DC

## 2023-03-11 NOTE — Patient Instructions (Signed)
Fasting blood work on or after April 11, 2023.  Fill patient assistance form for Nexlizet.  Sees if co-pay card for Nexlizet works at a [employer].  Increase amlodipine to 5 mg p.o. every afternoon  Follow-up in 3 months or sooner if needed.

## 2023-03-11 NOTE — Progress Notes (Signed)
ID:  Stephanie Baker, DOB 28-Jan-1941, MRN [id number]  PCP:  Donnajean Lopes, MD  Cardiologist:  Rex Kras, DO, [facility] (established care 02/04/2023)  Date: 03/11/23 Last Office Visit: 02/04/2023  Chief Complaint  Patient presents with   Chest Pain   Follow-up    4 weeks    HPI  Stephanie Baker is a 82 y.o. Caucasian female whose past medical history and cardiovascular risk factors include: History of coronary and aortic atherosclerosis (nongated CT study 2019), hyperlipidemia, history of intolerance to statin, former smoker (25-pack-year history, quit 33 years ago), history of PE / DVT(postoperative May 2023, currently on anticoagulation), history of pericarditis, hypertension.   Patient was referred to the practice for evaluation of chest pressure.  Symptoms are concerning for possible cardiac etiology and therefore recommended undergoing echo and stress test.  Results reviewed with her in detail and noted below for further reference.  She continues to have intermittent episodes of chest pressure but overall intensity frequency and duration has not changed.  No use of sublingual nitroglycerin tablets since last office encounter.  In addition, patient has history of coronary and aortic atherosclerosis based on prior CT scans but has been intolerant to statin therapy.  She was given samples for Nexlizet to see if she can tolerate nonstatin that she is not too inclined to use PCSK9 inhibitors or Leqvio.   Patient is a retired Community education officer and her husband was Doctor, general practice within the community.  FUNCTIONAL STATUS: No structured exercise program or daily routine.   ALLERGIES: Allergies  Allergen Reactions   Statins     MEDICATION LIST PRIOR TO VISIT: Current Meds  Medication Sig   Bempedoic Acid-Ezetimibe (NEXLIZET) 180-10 MG TABS Take 1 tablet by mouth daily at 12 noon.   Biotin 5000 MCG CAPS Take 1 capsule by mouth daily. Place 1 tablet under the tongue once  daily   Calcium Citrate-Vitamin D (CALCIUM CITRATE + D) 315-5 MG-MCG TABS Take 2 tablets by mouth in the morning and at bedtime.   cyclobenzaprine (FLEXERIL) 5 MG tablet Take 5 mg by mouth 3 (three) times daily as needed for muscle spasms.   ELIQUIS 5 MG TABS tablet TAKE 1 TABLET(5 MG) BY MOUTH TWICE DAILY   Evening Primrose Oil CAPS Take 1 capsule by mouth daily at 12 noon.   ketoconazole (NIZORAL) 2 % shampoo Apply 1 Application topically 2 (two) times a week.   levothyroxine (SYNTHROID) 25 MCG tablet Take 2 tablets (50 mcg total) by mouth daily before breakfast.   Magnesium Citrate 100 MG TABS Take 150 mg by mouth every morning.   Multiple Vitamins-Minerals (MULTIVITAMIN WITH MINERALS) tablet Take 1 tablet by mouth daily.   nitroGLYCERIN (NITROSTAT) 0.4 MG SL tablet Place 1 tablet (0.4 mg total) under the tongue every 5 (five) minutes as needed for chest pain. If you require more than two tablets five minutes apart go to the nearest ER via EMS.   olmesartan-hydrochlorothiazide (BENICAR HCT) 40-12.5 MG tablet Take 1 tablet by mouth daily before breakfast. For HTN   omega-3 acid ethyl esters (LOVAZA) 1 g capsule Take 2 g by mouth 3 (three) times daily.   potassium chloride SA (KLOR-CON M) 20 MEQ tablet Take 1 tablet (20 mEq total) by mouth daily before breakfast.   RESTASIS 0.05 % ophthalmic emulsion Place 1 drop into both eyes 2 (two) times daily.   Turmeric 500 MG CAPS Take 500 mg by mouth daily.   [DISCONTINUED] amLODipine (NORVASC) 2.5 MG tablet Take 1  tablet (2.5 mg total) by mouth daily before breakfast.     PAST MEDICAL HISTORY: Past Medical History:  Diagnosis Date   Arthritis    OA   GERD (gastroesophageal reflux disease)    Hematuria    idiopathic- [facility]   Hyperlipemia    Hypertension    Hypothyroidism     PAST SURGICAL HISTORY: Past Surgical History:  Procedure Laterality Date   ABDOMINAL HYSTERECTOMY     APPENDECTOMY     BACK SURGERY     lumbar   EYE  SURGERY     bilateral cataract extraction with IOL   GASTROC RECESSION EXTREMITY Left 02/23/2018   Procedure: Left Gastroc Recession;  Surgeon: Wylene Simmer, MD;  Location: [place];  Service: Orthopedics;  Laterality: Left;   HAND SURGERY     left thumb MP   JOINT REPLACEMENT     right knee   PARTIAL KNEE ARTHROPLASTY  11/14/2012   Procedure: UNICOMPARTMENTAL KNEE;  Surgeon: Mauri Pole, MD;  Location: [facility];  Service: Orthopedics;  Laterality: Left;   TARSAL METATARSAL ARTHRODESIS Left 02/23/2018   Procedure: Left First and Second Tarsometatarsal Arthrodesis;  Surgeon: Wylene Simmer, MD;  Location: [place];  Service: Orthopedics;  Laterality: Left;   thumb surgery     left   VAGINAL HYSTERECTOMY      FAMILY HISTORY: The patient family history includes Alcoholism in her brother; Breast cancer in her maternal aunt; Breast cancer (age of onset: 60) in her sister; Healthy in her brother and father; Heart attack in her mother.  SOCIAL HISTORY:  The patient  reports that she quit smoking about 34 years ago. Her smoking use included cigarettes. She has never used smokeless tobacco. She reports current alcohol use of about 7.0 standard drinks of alcohol per week. She reports that she does not use drugs.  REVIEW OF SYSTEMS: Review of Systems  Cardiovascular:  Positive for chest pain (pressure - see HPI) and dyspnea on exertion. Negative for claudication, irregular heartbeat, leg swelling, near-syncope, orthopnea, palpitations, paroxysmal nocturnal dyspnea and syncope.  Respiratory:  Negative for shortness of breath.   Hematologic/Lymphatic: Negative for bleeding problem.  Musculoskeletal:  Negative for muscle cramps and myalgias.  Neurological:  Negative for dizziness and light-headedness.    PHYSICAL EXAM:    03/11/2023   10:02 AM 02/04/2023    8:34 AM 06/08/2022   11:10 AM  Vitals with BMI  Height 5\' 4"  5\' 4"  5\' 4"   Weight 136 lbs 6 oz 137 lbs 124  lbs 3 oz  BMI 23.4 [id number] [id number]  Systolic Q000111Q 123XX123 Q000111Q  Diastolic 71 73 57  Pulse 82 74 61    Physical Exam  Constitutional: No distress.  Age appropriate, hemodynamically stable, walks w/ walker  Neck: No JVD present.  Cardiovascular: Normal rate, regular rhythm, S1 normal, S2 normal, intact distal pulses and normal pulses. Exam reveals no gallop, no S3 and no S4.  Murmur heard. Holosystolic murmur is present with a grade of 3/6 at the apex. Pulmonary/Chest: Effort normal and breath sounds normal. No stridor. She has no wheezes. She has no rales.  Abdominal: Soft. Bowel sounds are normal. She exhibits no distension. There is no abdominal tenderness.  Musculoskeletal:        General: No edema.     Cervical back: Neck supple.  Neurological: She is alert and oriented to person, place, and time. She has intact cranial nerves (2-12).  Skin: Skin is warm and moist.  Easy bruising    CARDIAC DATABASE: EKG: 02/04/2023: Sinus rhythm, 65 bpm, left axis, TWI in leads V1 and V2, without underlying injury pattern.  Echocardiogram: 08/2018: Mild LVH, LVEF 60-65%, aortic valve sclerosis, moderate AI, mild MAC, mild MR, mild TR, mild PR.  02/11/2023: Normal LV systolic function with visual EF 60-65%. Left ventricle cavity is normal in size. Normal left ventricular wall thickness. Normal global wall motion. Normal diastolic filling pattern, normal LAP.  Mild (Grade I) aortic regurgitation. Mild tricuspid regurgitation. No evidence of pulmonary hypertension. Mild pulmonic regurgitation. Compared to 08/2018 moderate AR is now mild otherwise no significant change.   Stress Testing: MPI September 2019: Per report stress ECG negative for ischemia.  No ischemia or previous infarction.  Lexiscan Tetrofosmin stress test 02/22/2023: 1 Day Rest/Stress protocol.  Stress EKG is non-diagnostic, as this is pharmacological stress test using Lexiscan. Normal myocardial perfusion without evidence of  reversible ischemia or prior infarct. Left ventricular size is normal, wall thickness preserved, no regionality noted. Calculated LVEF 65% No prior studies available for comparison.  Low risk study.   Heart Catheterization: None  LABORATORY DATA:    Latest Ref Rng & Units 05/17/2022   12:00 AM 11/15/2012    4:25 AM 11/01/2012   10:10 AM  CBC  WBC  4.5     11.4  4.8   Hemoglobin 12.0 - 16.0 10.7     11.9  14.1   Hematocrit 36 - 46 31     34.5  41.2   Platelets 150 - 400 K/uL 423     264  301      This result is from an external source.       Latest Ref Rng & Units 02/04/2023   10:22 AM 06/03/2022   12:00 AM 05/17/2022   12:00 AM  CMP  Glucose 70 - 99 mg/dL 100     BUN 8 - 27 mg/dL 26  29     30       Creatinine 0.57 - 1.00 mg/dL 0.55  0.5     0.5      Sodium 134 - 144 mmol/L 140  139     137      Potassium 3.5 - 5.2 mmol/L 3.8  3.9     3.9      Chloride 96 - 106 mmol/L 104  105     104      CO2 20 - 29 mmol/L 23  25     23       Calcium 8.7 - 10.3 mg/dL 9.4  9.9     9.5      Alkaline Phos 25 - 125  97     118      AST 13 - 35  18     12      ALT 7 - 35 U/L  16     8         This result is from an external source.    Lipid Panel  No results found for: "CHOL", "TRIG", "HDL", "CHOLHDL", "VLDL", "LDLCALC", "LDLDIRECT", "LABVLDL"  No components found for: "NTPROBNP" Recent Labs    02/04/23 1022  PROBNP 135   No results for input(s): "TSH" in the last 8760 hours.  BMP Recent Labs    05/17/22 0000 06/03/22 0000 02/04/23 1022  NA 137 139 140  K 3.9 3.9 3.8  CL 104 105 104  CO2 23* 25* 23  GLUCOSE  --   --  100*  BUN  30* 29* 26  CREATININE 0.5 0.5 0.55*  CALCIUM 9.5 9.9 9.4    HEMOGLOBIN A1C No results found for: "HGBA1C", "MPG"  External Labs: Collected: 07/19/2022 provided by referring physician Total cholesterol 209, triglycerides 73, HDL 85, LDL 109, non-HDL 124 TSH 3.0 BUN 21, creatinine 0.6. Sodium 139, potassium 3.9, chloride 108, bicarb 25. AST 19,  ALT 16, alkaline phosphatase 72  IMPRESSION:    ICD-10-CM   1. Precordial pain  R07.2     2. Calcification of native coronary artery  I25.10 Lipid Panel With LDL/HDL Ratio   I25.84 LDL cholesterol, direct    CMP14+EGFR    3. Atherosclerosis of aorta (HCC)  I70.0 Lipid Panel With LDL/HDL Ratio    LDL cholesterol, direct    CMP14+EGFR    4. Mixed hyperlipidemia  E78.2 Lipid Panel With LDL/HDL Ratio    LDL cholesterol, direct    CMP14+EGFR    5. Benign hypertension  I10     6. Statin intolerance  Z78.9     7. History of DVT (deep vein thrombosis)  Z86.718     8. Hx pulmonary embolism  Z86.711     9. Former smoker  Z87.891     80. Dyspnea on exertion  R06.09        RECOMMENDATIONS: Stephanie Baker is a 69 y.o. Caucasian female whose past medical history and cardiac risk factors include: History of coronary and aortic atherosclerosis (nongated CT study 2019), hyperlipidemia, history of intolerance to statin, former smoker (25-pack-year history, quit 33 years ago), history of PE / DVT(postoperative May 2023, currently on anticoagulation), history of pericarditis, hypertension.   Precordial pain Calcification of native coronary artery Atherosclerosis of aorta (HCC) Still present, intermittently. No change in intensity frequency or duration. Overall functional capacity remains stable. Echocardiogram: Preserved LVEF, normal diastolic function, mild valvular heart disease. Lexiscan: Low risk study Focused on improving her modifiable cardiovascular risk factors. She is willing to uptitrate antihypertensive medications to see if that would improve dyspnea on exertion. Increase amlodipine from 2.5 mg p.o. daily to 5 mg p.o. daily  Dyspnea on exertion Not in overt heart failure. May be secondary to elevated blood pressures at baseline. Uptitrate antihypertensive medications for now. Echo and stress test results reviewed as noted above  Mixed hyperlipidemia Statin  intolerance Currently not on any statins or pharmacological therapy given her history of intolerance. Recommend goal LDL <55 mg/dL Started her on Nexlizet in February 2024 and has done well. However it is costly for the patient (approximately $300 for 30-day supply).  Given the information with regards to patient assistance.  Also provided her a co-pay card.  No samples available to provide. She has been on it for the last 3 weeks would like her to repeat fasting lipid profile after 6 weeks of total therapy.  Labs placed  Benign hypertension Blood pressures are not well-controlled at the office visit. Willing to uptitrate antihypertensive medications. Will increase amlodipine to 5 mg p.o. daily Monitor for now  History of DVT (deep vein thrombosis) / Hx pulmonary embolism Currently on anticoagulation managed by hematology per patient. Provoked after back surgery in May 2023 She will follow-up with hematology oncology to further evaluate the duration of anticoagulation.   FINAL MEDICATION LIST END OF ENCOUNTER: Meds ordered this encounter  Medications   DISCONTD: amLODipine (NORVASC) 5 MG tablet    Sig: Take 1 tablet (5 mg total) by mouth daily at 10 pm.    Dispense:  30 tablet    Refill:  0    Medications Discontinued During This Encounter  Medication Reason   acetaminophen (TYLENOL) 500 MG tablet Patient Preference   amLODipine (NORVASC) 2.5 MG tablet Reorder     Current Outpatient Medications:    Bempedoic Acid-Ezetimibe (NEXLIZET) 180-10 MG TABS, Take 1 tablet by mouth daily at 12 noon., Disp: 90 tablet, Rfl: 0   Biotin 5000 MCG CAPS, Take 1 capsule by mouth daily. Place 1 tablet under the tongue once daily, Disp: , Rfl:    Calcium Citrate-Vitamin D (CALCIUM CITRATE + D) 315-5 MG-MCG TABS, Take 2 tablets by mouth in the morning and at bedtime., Disp: , Rfl:    cyclobenzaprine (FLEXERIL) 5 MG tablet, Take 5 mg by mouth 3 (three) times daily as needed for muscle spasms., Disp:  , Rfl:    ELIQUIS 5 MG TABS tablet, TAKE 1 TABLET(5 MG) BY MOUTH TWICE DAILY, Disp: 60 tablet, Rfl: 1   Evening Primrose Oil CAPS, Take 1 capsule by mouth daily at 12 noon., Disp: , Rfl:    ketoconazole (NIZORAL) 2 % shampoo, Apply 1 Application topically 2 (two) times a week., Disp: , Rfl:    levothyroxine (SYNTHROID) 25 MCG tablet, Take 2 tablets (50 mcg total) by mouth daily before breakfast., Disp: 30 tablet, Rfl: 0   Magnesium Citrate 100 MG TABS, Take 150 mg by mouth every morning., Disp: , Rfl:    Multiple Vitamins-Minerals (MULTIVITAMIN WITH MINERALS) tablet, Take 1 tablet by mouth daily., Disp: , Rfl:    nitroGLYCERIN (NITROSTAT) 0.4 MG SL tablet, Place 1 tablet (0.4 mg total) under the tongue every 5 (five) minutes as needed for chest pain. If you require more than two tablets five minutes apart go to the nearest ER via EMS., Disp: 30 tablet, Rfl: 0   olmesartan-hydrochlorothiazide (BENICAR HCT) 40-12.5 MG tablet, Take 1 tablet by mouth daily before breakfast. For HTN, Disp: 30 tablet, Rfl: 0   omega-3 acid ethyl esters (LOVAZA) 1 g capsule, Take 2 g by mouth 3 (three) times daily., Disp: , Rfl:    potassium chloride SA (KLOR-CON M) 20 MEQ tablet, Take 1 tablet (20 mEq total) by mouth daily before breakfast., Disp: 30 tablet, Rfl: 0   RESTASIS 0.05 % ophthalmic emulsion, Place 1 drop into both eyes 2 (two) times daily., Disp: , Rfl:    Turmeric 500 MG CAPS, Take 500 mg by mouth daily., Disp: , Rfl:    amLODipine (NORVASC) 5 MG tablet, TAKE 1 TABLET(5 MG) BY MOUTH DAILY AT 10 PM, Disp: 90 tablet, Rfl: 1  Orders Placed This Encounter  Procedures   Lipid Panel With LDL/HDL Ratio   LDL cholesterol, direct   CMP14+EGFR    Patient Instructions  Fasting blood work on or after April 11, 2023.  Fill patient assistance form for Nexlizet.  Sees if co-pay card for Nexlizet works at a [employer].  Increase amlodipine to 5 mg p.o. every afternoon  Follow-up in 3 months or sooner if  needed.    --Continue cardiac medications as reconciled in final medication list. --Return in about 3 months (around 06/11/2023) for Reevaluation of chest pressure and shortness of breath. or sooner if needed. --Continue follow-up with your primary care physician regarding the management of your other chronic comorbid conditions.  Patient's questions and concerns were addressed to her satisfaction. She voices understanding of the instructions provided during this encounter.   This note was created using a voice recognition software as a result there may be grammatical errors inadvertently enclosed that do not  reflect the nature of this encounter. Every attempt is made to correct such errors.  Rex Kras, [place], [facility]  Pager:  [phone] Office: [phone]

## 2023-03-14 ENCOUNTER — Telehealth: Payer: Self-pay

## 2023-03-14 ENCOUNTER — Other Ambulatory Visit: Payer: Self-pay | Admitting: Cardiology

## 2023-03-14 DIAGNOSIS — E782 Mixed hyperlipidemia: Secondary | ICD-10-CM | POA: Diagnosis not present

## 2023-03-14 DIAGNOSIS — I2510 Atherosclerotic heart disease of native coronary artery without angina pectoris: Secondary | ICD-10-CM | POA: Diagnosis not present

## 2023-03-14 DIAGNOSIS — I700 Atherosclerosis of aorta: Secondary | ICD-10-CM | POA: Diagnosis not present

## 2023-03-14 DIAGNOSIS — I2584 Coronary atherosclerosis due to calcified coronary lesion: Secondary | ICD-10-CM | POA: Diagnosis not present

## 2023-03-15 ENCOUNTER — Other Ambulatory Visit: Payer: Self-pay

## 2023-03-15 DIAGNOSIS — I2510 Atherosclerotic heart disease of native coronary artery without angina pectoris: Secondary | ICD-10-CM

## 2023-03-15 DIAGNOSIS — I700 Atherosclerosis of aorta: Secondary | ICD-10-CM

## 2023-03-15 DIAGNOSIS — E782 Mixed hyperlipidemia: Secondary | ICD-10-CM

## 2023-03-15 LAB — CMP14+EGFR
ALT: 14 IU/L (ref 0–32)
AST: 23 IU/L (ref 0–40)
Albumin/Globulin Ratio: 1.8 (ref 1.2–2.2)
Albumin: 4.3 g/dL (ref 3.7–4.7)
Alkaline Phosphatase: 57 IU/L (ref 44–121)
BUN/Creatinine Ratio: 33 — ABNORMAL HIGH (ref 12–28)
BUN: 27 mg/dL (ref 8–27)
Bilirubin Total: 0.6 mg/dL (ref 0.0–1.2)
CO2: 21 mmol/L (ref 20–29)
Calcium: 9.7 mg/dL (ref 8.7–10.3)
Chloride: 105 mmol/L (ref 96–106)
Creatinine, Ser: 0.82 mg/dL (ref 0.57–1.00)
Globulin, Total: 2.4 g/dL (ref 1.5–4.5)
Glucose: 91 mg/dL (ref 70–99)
Potassium: 3.9 mmol/L (ref 3.5–5.2)
Sodium: 142 mmol/L (ref 134–144)
Total Protein: 6.7 g/dL (ref 6.0–8.5)
eGFR: 71 mL/min/{1.73_m2} (ref >59)

## 2023-03-15 LAB — LIPID PANEL WITH LDL/HDL RATIO
Cholesterol, Total: 141 mg/dL (ref 100–199)
HDL: 76 mg/dL (ref >39)
LDL Chol Calc (NIH): 53 mg/dL (ref 0–99)
LDL/HDL Ratio: 0.7 ratio (ref 0.0–3.2)
Triglycerides: 56 mg/dL (ref 0–149)
VLDL Cholesterol Cal: 12 mg/dL (ref 5–40)

## 2023-03-15 LAB — LDL CHOLESTEROL, DIRECT: LDL Direct: 58 mg/dL (ref 0–99)

## 2023-03-16 ENCOUNTER — Other Ambulatory Visit: Payer: Self-pay

## 2023-03-16 ENCOUNTER — Other Ambulatory Visit: Payer: Self-pay | Admitting: Cardiology

## 2023-03-16 DIAGNOSIS — Z789 Other specified health status: Secondary | ICD-10-CM

## 2023-03-16 DIAGNOSIS — I700 Atherosclerosis of aorta: Secondary | ICD-10-CM

## 2023-03-16 DIAGNOSIS — I2510 Atherosclerotic heart disease of native coronary artery without angina pectoris: Secondary | ICD-10-CM

## 2023-03-16 MED ORDER — NEXLIZET 180-10 MG PO TABS: ORAL_TABLET | ORAL | 3 refills | Status: DC

## 2023-03-17 NOTE — Progress Notes (Signed)
Gave patient results, she agrees to continue Nexlizet.

## 2023-03-17 NOTE — Progress Notes (Signed)
LMTCB

## 2023-03-18 NOTE — Telephone Encounter (Signed)
Needs assistance medications over 300 dollars with her insurance.

## 2023-03-20 DIAGNOSIS — Z23 Encounter for immunization: Secondary | ICD-10-CM | POA: Diagnosis not present

## 2023-03-21 ENCOUNTER — Telehealth: Payer: Self-pay

## 2023-03-21 NOTE — Telephone Encounter (Signed)
Patient got approve for [employer] for Nexlizet for $2500. When patient calls back for a refill we should send it to [facility] it is a Psychologist, [facility] probation services.

## 2023-03-23 ENCOUNTER — Encounter: Payer: Self-pay | Admitting: Orthopedic Surgery

## 2023-03-24 ENCOUNTER — Other Ambulatory Visit (HOSPITAL_COMMUNITY): Payer: Self-pay | Admitting: Orthopedic Surgery

## 2023-03-24 DIAGNOSIS — Z981 Arthrodesis status: Secondary | ICD-10-CM

## 2023-04-01 ENCOUNTER — Ambulatory Visit
Admission: RE | Admit: 2023-04-01 | Discharge: 2023-04-01 | Disposition: A | Discharge status: Home / Self Care | Payer: Medicare Other | Source: Ambulatory Visit | Attending: Orthopedic Surgery | Admitting: Orthopedic Surgery

## 2023-04-01 ENCOUNTER — Encounter
Admission: RE | Admit: 2023-04-01 | Discharge: 2023-04-01 | Disposition: A | Discharge status: Home / Self Care | Payer: Medicare Other | Source: Ambulatory Visit | Attending: Orthopedic Surgery | Admitting: Orthopedic Surgery

## 2023-04-01 DIAGNOSIS — Z09 Encounter for follow-up examination after completed treatment for conditions other than malignant neoplasm: Secondary | ICD-10-CM | POA: Insufficient documentation

## 2023-04-01 DIAGNOSIS — Z981 Arthrodesis status: Secondary | ICD-10-CM | POA: Diagnosis not present

## 2023-04-01 DIAGNOSIS — I700 Atherosclerosis of aorta: Secondary | ICD-10-CM | POA: Diagnosis not present

## 2023-04-01 MED ORDER — TECHNETIUM TC 99M MEDRONATE IV KIT
22.8100 | PACK | Freq: Once | INTRAVENOUS | Status: AC | PRN
  Administered 2023-04-01: 22.81 via INTRAVENOUS

## 2023-04-04 DIAGNOSIS — N390 Urinary tract infection, site not specified: Secondary | ICD-10-CM | POA: Diagnosis not present

## 2023-04-04 DIAGNOSIS — R300 Dysuria: Secondary | ICD-10-CM | POA: Diagnosis not present

## 2023-04-12 ENCOUNTER — Encounter: Payer: Self-pay | Admitting: Internal Medicine

## 2023-04-12 ENCOUNTER — Non-Acute Institutional Stay: Payer: Medicare Other | Admitting: Internal Medicine

## 2023-04-12 VITALS — BP 124/68 | HR 67 | Temp 97.6°F | Resp 17 | Ht 64.0 in | Wt 137.0 lb

## 2023-04-12 DIAGNOSIS — I10 Essential (primary) hypertension: Secondary | ICD-10-CM

## 2023-04-12 DIAGNOSIS — I2694 Multiple subsegmental pulmonary emboli without acute cor pulmonale: Secondary | ICD-10-CM | POA: Diagnosis not present

## 2023-04-12 DIAGNOSIS — Z9889 Other specified postprocedural states: Secondary | ICD-10-CM

## 2023-04-12 DIAGNOSIS — K5901 Slow transit constipation: Secondary | ICD-10-CM

## 2023-04-12 DIAGNOSIS — R350 Frequency of micturition: Secondary | ICD-10-CM

## 2023-04-12 DIAGNOSIS — E039 Hypothyroidism, unspecified: Secondary | ICD-10-CM

## 2023-04-12 DIAGNOSIS — B3731 Acute candidiasis of vulva and vagina: Secondary | ICD-10-CM | POA: Diagnosis not present

## 2023-04-12 MED ORDER — FLUCONAZOLE 100 MG PO TABS: 100.0000 mg | ORAL_TABLET | Freq: Every day | ORAL | 0 refills | Status: DC

## 2023-04-12 NOTE — Patient Instructions (Addendum)
Can use OTC miconazole ointment for itching in vaginal area

## 2023-04-12 NOTE — Progress Notes (Signed)
Location:  [facility] features editor of Service:  Clinic (12)  Provider:   Code Status:  Goals of Care:     04/12/2023    3:11 PM  Advanced Directives  Does Patient Have a Medical Advance Directive? Yes  Type of Estate agent of [place];Living will;Out of facility DNR (pink MOST or yellow form)  Does patient want to make changes to medical advance directive? No - Patient declined  Copy of Healthcare Power of Attorney in Chart? No - copy requested     Chief Complaint  Patient presents with   New Patient (Initial Visit)    Patient states she is here to establish care. Patient has had back pain since [place]   Quality Metric Gaps    Patient due for AWV. Monday not a good day will schedule with someone else    HPI: Patient is a 82 y.o. female seen today for medical management of chronic diseases.   Came to establish Care  Acute issues Urinary frequency with Dysuria She was recently treated with Macrodantin by Dr Jarold Motto in [place] Her Urine did not grew anything UA was positive for RBC  Lower Back Pain Patient had Lumbar Fusion done in May 2023  Her Post op was complicated by Multiple PE and Right Common Femoral Vein DVT She has been on Eliquis since then Hematology has told her to now discontinue Eliquis  Her pain is still there in lower back CT  She is not taking anything like tylenol Walks with her walker Chest pain With SOB Sees Cardiology Echo normal Lexiscan Low risk study Statins and BP control    Past Medical History:  Diagnosis Date   Arthritis    OA   GERD (gastroesophageal reflux disease)    Hematuria    idiopathic- Dr. Mena [facility]   Hyperlipemia    Hypertension    Hypothyroidism     Past Surgical History:  Procedure Laterality Date   ABDOMINAL HYSTERECTOMY     APPENDECTOMY     BACK SURGERY     lumbar   EYE SURGERY     bilateral cataract extraction with IOL   GASTROC RECESSION EXTREMITY Left 02/23/2018    Procedure: Left Gastroc Recession;  Surgeon: Toni Arthurs, MD;  Location: [facility];  Service: Orthopedics;  Laterality: Left;   HAND SURGERY     left thumb MP   JOINT REPLACEMENT     right knee   PARTIAL KNEE ARTHROPLASTY  11/14/2012   Procedure: UNICOMPARTMENTAL KNEE;  Surgeon: Shelda Pal, MD;  Location: [facility];  Service: Orthopedics;  Laterality: Left;   TARSAL METATARSAL ARTHRODESIS Left 02/23/2018   Procedure: Left First and Second Tarsometatarsal Arthrodesis;  Surgeon: Toni Arthurs, MD;  Location: [facility];  Service: Orthopedics;  Laterality: Left;   thumb surgery     left   VAGINAL HYSTERECTOMY      Allergies  Allergen Reactions   Statins     Outpatient Encounter Medications as of 04/12/2023  Medication Sig   amLODipine (NORVASC) 5 MG tablet TAKE 1 TABLET(5 MG) BY MOUTH DAILY AT 10 PM (Patient taking differently: Take 5 mg by mouth daily.)   Bempedoic Acid-Ezetimibe (NEXLIZET) 180-10 MG TABS TAKE 1 TABLET BY MOUTH EVERY DAY AT 12 NOON.   Biotin 5000 MCG CAPS Take 1 capsule by mouth daily.   Calcium Citrate-Vitamin D (CALCIUM CITRATE + D) 315-5 MG-MCG TABS Take 2 tablets by mouth in the morning and at bedtime.  ELIQUIS 5 MG TABS tablet TAKE 1 TABLET(5 MG) BY MOUTH TWICE DAILY   Evening Primrose Oil CAPS Take 1 capsule by mouth daily at 12 noon.   fluconazole (DIFLUCAN) 100 MG tablet Take 1 tablet (100 mg total) by mouth daily.   ketoconazole (NIZORAL) 2 % shampoo Apply 1 Application topically 2 (two) times a week.   levothyroxine (SYNTHROID) 25 MCG tablet Take 2 tablets (50 mcg total) by mouth daily before breakfast. (Patient taking differently: Take 25 mcg by mouth daily before breakfast.)   Magnesium Citrate 100 MG TABS Take 150 mg by mouth every morning.   Multiple Vitamins-Minerals (MULTIVITAMIN WITH MINERALS) tablet Take 1 tablet by mouth daily.   olmesartan-hydrochlorothiazide (BENICAR HCT) 40-12.5 MG tablet Take 1 tablet by mouth  daily before breakfast. For HTN (Patient taking differently: Take 1 tablet by mouth daily after breakfast. For HTN)   omega-3 acid ethyl esters (LOVAZA) 1 g capsule Take 2 g by mouth 2 (two) times daily.   potassium chloride SA (KLOR-CON M) 20 MEQ tablet Take 1 tablet (20 mEq total) by mouth daily before breakfast. (Patient taking differently: Take 20 mEq by mouth daily after breakfast.)   RESTASIS 0.05 % ophthalmic emulsion Place 1 drop into both eyes 2 (two) times daily.   Turmeric 500 MG CAPS Take 500 mg by mouth daily.   nitroGLYCERIN (NITROSTAT) 0.4 MG SL tablet Place 1 tablet (0.4 mg total) under the tongue every 5 (five) minutes as needed for chest pain. If you require more than two tablets five minutes apart go to the nearest ER via EMS.   [DISCONTINUED] cyclobenzaprine (FLEXERIL) 5 MG tablet Take 5 mg by mouth 3 (three) times daily as needed for muscle spasms.   No facility-administered encounter medications on file as of 04/12/2023.    Review of Systems:  Review of Systems  Constitutional:  Negative for activity change and appetite change.  HENT: Negative.    Respiratory:  Positive for shortness of breath. Negative for cough.   Cardiovascular:  Positive for chest pain. Negative for leg swelling.  Gastrointestinal:  Positive for constipation.  Genitourinary:  Positive for dysuria and frequency.  Musculoskeletal:  Positive for back pain and gait problem. Negative for arthralgias and myalgias.  Skin: Negative.   Neurological:  Negative for dizziness and weakness.  Psychiatric/Behavioral:  Negative for confusion, dysphoric mood and sleep disturbance.     Health Maintenance  Topic Date Due   Pneumonia Vaccine 49+ Years old (1 of 2 - PCV) Never done   Medicare Annual Wellness (AWV)  04/07/2022   COVID-19 Vaccine (8 - 2023-24 season) 05/15/2023   INFLUENZA VACCINE  07/28/2023   DEXA SCAN  Completed   Zoster Vaccines- Shingrix  Completed   HPV VACCINES  Aged Out   DTaP/Tdap/Td   Discontinued    Physical Exam: Vitals:   04/12/23 1517  BP: 124/68  Pulse: 67  Resp: 17  Temp: 97.6 F (36.4 C)  TempSrc: Temporal  SpO2: 96%  Weight: 137 lb (62.1 kg)  Height:  (1.626 m)   Body mass index is 23.52 kg/m. Physical Exam Vitals reviewed.  Constitutional:      Appearance: Normal appearance.  HENT:     Head: Normocephalic.     Right Ear: Tympanic membrane normal.     Left Ear: Tympanic membrane normal.     Nose: Nose normal.     Mouth/Throat:     Mouth: Mucous membranes are moist.     Pharynx: Oropharynx is clear.  Eyes:  Pupils: Pupils are equal, round, and reactive to light.  Cardiovascular:     Rate and Rhythm: Normal rate and regular rhythm.     Pulses: Normal pulses.     Heart sounds: Murmur heard.  Pulmonary:     Effort: Pulmonary effort is normal.     Breath sounds: Normal breath sounds.  Abdominal:     General: Abdomen is flat. Bowel sounds are normal.     Palpations: Abdomen is soft.  Genitourinary:    Comments: Vaginal Exam Redness around the Urethra and Vaginal area Musculoskeletal:        General: No swelling.     Cervical back: Neck supple.     Comments: Mild swelling with Bruises in her Legs  Skin:    General: Skin is warm.  Neurological:     General: No focal deficit present.     Mental Status: She is alert and oriented to person, place, and time.  Psychiatric:        Mood and Affect: Mood normal.        Thought Content: Thought content normal.     Labs reviewed: Basic Metabolic Panel: Recent Labs    06/03/22 0000 02/04/23 1022 03/14/23 0834  NA 139 140 142  K 3.9 3.8 3.9  CL 105 104 105  CO2 25* 23 21  GLUCOSE  --  100* 91  BUN 29* 26 27  CREATININE 0.5 0.55* 0.82  CALCIUM 9.9 9.4 9.7  MG  --  2.0  --    Liver Function Tests: Recent Labs    05/17/22 0000 06/03/22 0000 03/14/23 0834  AST 12* 18 23  ALT 8 16 14   ALKPHOS 118 97 57  BILITOT  --   --  0.6  PROT  --   --  6.7  ALBUMIN 3.2* 3.5 4.3    No results for input(s): "LIPASE", "AMYLASE" in the last 8760 hours. No results for input(s): "AMMONIA" in the last 8760 hours. CBC: Recent Labs    05/17/22 0000  WBC 4.5  HGB 10.7*  HCT 31*  PLT 423*   Lipid Panel: Recent Labs    03/14/23 0834  CHOL 141  HDL 76  LDLCALC 53  TRIG 56  LDLDIRECT 58   No results found for: "HGBA1C"  Procedures since last visit: NM Bone W/Spect CT  Result Date: 04/04/2023 CLINICAL DATA:  Pain and lumbar spine since 05/23. This is status post T11 through L4-5 posterior rod and pedicle screw fixation. EXAM: NM BONE SCAN AND SPECT/CT this IMAGING TECHNIQUE: After intravenous injection of radiopharmaceutical, delayed planar images were obtained in multiple projections. Additionally, delayed triplanar SPECT images were obtained through the area of interest. CT images through the area of interest were obtained for anatomic localization. RADIOPHARMACEUTICALS:  22.81 mCi Tc-65m MDP COMPARISON:  Lumbar myelogram from 10/28/2021 FINDINGS: The patient is status post posterior hardware fixation at T11 through L4. Interbody fusion at L3-4 has been performed. Marked degenerative disc disease is identified at T9-10 and T10-11. On the corresponding bone scan images there is marked increased uptake localizing to the endplates at T9-10 and T10-11. No signs of acute fracture identified. Incidental findings from the CT portion of the exam include: Mild cardiac enlargement, aortic atherosclerosis and coronary artery calcifications. Aortic atherosclerosis noted involving the abdominal aorta. There is a exophytic cyst arising off the inferior pole of the left kidney which is obscured by streak artifact from lumbar spine hardware. IMPRESSION: 1. Increased radiotracer uptake localizes to the endplates at T9-10 and  T10-11. On the corresponding CT images there is marked degenerative disc disease at these levels. No underlying fracture identified. 2. Status post posterior hardware  fixation of T11 through L4 with interbody fusion at L3-4. 3. Aortic Atherosclerosis (ICD10-I70.0). Coronary artery calcifications. . Electronically Signed   By: Signa Kell M.D.   On: 04/04/2023 08:29    Assessment/Plan 1. Urinary frequency UA also had some Blood Culture was negative She did get Macrodantin per her old PCP - Ambulatory referral to Urology  2. Vaginal candidiasis Will treat her with Diflucan 100 mg QD for 3 days OTC miconazole  3. Multiple subsegmental pulmonary emboli without acute cor pulmonale Would be off Eliquis soon  4. Primary hypertension Amlodipine and Benicar HCT  5. Acquired hypothyroidism Needs repeat TSH  6. Slow transit constipation   7. History of back surgery Walking with her walker Tylenol prn 8 HLD Intolerable statins LDL Less then 70   Labs/tests ordered:  CBC,CMP TSH before next visit Next appt:  07/12/2023   Needs labs

## 2023-04-20 ENCOUNTER — Other Ambulatory Visit: Payer: Self-pay

## 2023-04-20 DIAGNOSIS — I10 Essential (primary) hypertension: Secondary | ICD-10-CM

## 2023-04-20 MED ORDER — OLMESARTAN MEDOXOMIL-HCTZ 40-12.5 MG PO TABS
1.0000 | ORAL_TABLET | Freq: Every day | ORAL | 0 refills | Status: DC
Start: 2023-04-20 — End: 2023-06-08

## 2023-04-21 DIAGNOSIS — L570 Actinic keratosis: Secondary | ICD-10-CM | POA: Diagnosis not present

## 2023-04-21 DIAGNOSIS — D692 Other nonthrombocytopenic purpura: Secondary | ICD-10-CM | POA: Diagnosis not present

## 2023-04-21 DIAGNOSIS — L309 Dermatitis, unspecified: Secondary | ICD-10-CM | POA: Diagnosis not present

## 2023-04-21 DIAGNOSIS — D1801 Hemangioma of skin and subcutaneous tissue: Secondary | ICD-10-CM | POA: Diagnosis not present

## 2023-04-21 DIAGNOSIS — L218 Other seborrheic dermatitis: Secondary | ICD-10-CM | POA: Diagnosis not present

## 2023-04-21 DIAGNOSIS — L814 Other melanin hyperpigmentation: Secondary | ICD-10-CM | POA: Diagnosis not present

## 2023-04-22 DIAGNOSIS — R350 Frequency of micturition: Secondary | ICD-10-CM | POA: Diagnosis not present

## 2023-04-22 DIAGNOSIS — R311 Benign essential microscopic hematuria: Secondary | ICD-10-CM | POA: Diagnosis not present

## 2023-04-22 DIAGNOSIS — N281 Cyst of kidney, acquired: Secondary | ICD-10-CM | POA: Diagnosis not present

## 2023-04-22 DIAGNOSIS — R310 Gross hematuria: Secondary | ICD-10-CM | POA: Diagnosis not present

## 2023-04-26 ENCOUNTER — Encounter: Payer: Self-pay | Admitting: Family

## 2023-04-26 ENCOUNTER — Ambulatory Visit (INDEPENDENT_AMBULATORY_CARE_PROVIDER_SITE_OTHER): Payer: Medicare Other | Admitting: Family

## 2023-04-26 DIAGNOSIS — Z0000 Encounter for general adult medical examination without abnormal findings: Secondary | ICD-10-CM

## 2023-04-26 NOTE — Patient Instructions (Addendum)
Stephanie Baker , Thank you for taking time to come for your Medicare Wellness Visit. I appreciate your ongoing commitment to your health goals. Please review the following plan we discussed and let me know if I can assist you in the future.   Screening recommendations/referrals: Colonoscopy : N/A  Mammogram : N/A Bone Density : Up to date  Recommended yearly ophthalmology/optometry visit for glaucoma screening and checkup Recommended yearly dental visit for hygiene and checkup  Vaccinations: Influenza vaccine- due annually in September/October Pneumococcal vaccine : : Up to date  Tdap vaccine : Due  Shingles vaccine : Up to date     Advanced directives: Yes   Conditions/risks identified:  advanced age (>29men, >48 women);sedentary lifestyle;smoking/ tobacco exposure;hypertension  Next appointment: 1 year    Preventive Care 75 Years and Older, Female Preventive care refers to lifestyle choices and visits with your health care provider that can promote health and wellness. What does preventive care include? A yearly physical exam. This is also called an annual well check. Dental exams once or twice a year. Routine eye exams. Ask your health care provider how often you should have your eyes checked. Personal lifestyle choices, including: Daily care of your teeth and gums. Regular physical activity. Eating a healthy diet. Avoiding tobacco and drug use. Limiting alcohol use. Practicing safe sex. Taking low-dose aspirin every day. Taking vitamin and mineral supplements as recommended by your health care provider. What happens during an annual well check? The services and screenings done by your health care provider during your annual well check will depend on your age, overall health, lifestyle risk factors, and family history of disease. Counseling  Your health care provider may ask you questions about your: Alcohol use. Tobacco use. Drug use. Emotional well-being. Home and  relationship well-being. Sexual activity. Eating habits. History of falls. Memory and ability to understand (cognition). Work and work Astronomer. Reproductive health. Screening  You may have the following tests or measurements: Height, weight, and BMI. Blood pressure. Lipid and cholesterol levels. These may be checked every 5 years, or more frequently if you are over 66 years old. Skin check. Lung cancer screening. You may have this screening every year starting at age 85 if you have a 30-pack-year history of smoking and currently smoke or have quit within the past 15 years. Fecal occult blood test (FOBT) of the stool. You may have this test every year starting at age 55. Flexible sigmoidoscopy or colonoscopy. You may have a sigmoidoscopy every 5 years or a colonoscopy every 10 years starting at age 75. Hepatitis C blood test. Hepatitis B blood test. Sexually transmitted disease (STD) testing. Diabetes screening. This is done by checking your blood sugar (glucose) after you have not eaten for a while (fasting). You may have this done every 1-3 years. Bone density scan. This is done to screen for osteoporosis. You may have this done starting at age 45. Mammogram. This may be done every 1-2 years. Talk to your health care provider about how often you should have regular mammograms. Talk with your health care provider about your test results, treatment options, and if necessary, the need for more tests. Vaccines  Your health care provider may recommend certain vaccines, such as: Influenza vaccine. This is recommended every year. Tetanus, diphtheria, and acellular pertussis (Tdap, Td) vaccine. You may need a Td booster every 10 years. Zoster vaccine. You may need this after age 2. Pneumococcal 13-valent conjugate (PCV13) vaccine. One dose is recommended after age 29. Pneumococcal polysaccharide (  PPSV23) vaccine. One dose is recommended after age 28. Talk to your health care provider  about which screenings and vaccines you need and how often you need them. This information is not intended to replace advice given to you by your health care provider. Make sure you discuss any questions you have with your health care provider. Document Released: 01/09/2016 Document Revised: 09/01/2016 Document Reviewed: 10/14/2015 Elsevier Interactive Patient Education  2017 [place] Prevention in the Home Falls can cause injuries. They can happen to people of all ages. There are many things you can do to make your home safe and to help prevent falls. What can I do on the outside of my home? Regularly fix the edges of walkways and driveways and fix any cracks. Remove anything that might make you trip as you walk through a door, such as a raised step or threshold. Trim any bushes or trees on the path to your home. Use bright outdoor lighting. Clear any walking paths of anything that might make someone trip, such as rocks or tools. Regularly check to see if handrails are loose or broken. Make sure that both sides of any steps have handrails. Any raised decks and porches should have guardrails on the edges. Have any leaves, snow, or ice cleared regularly. Use sand or salt on walking paths during winter. Clean up any spills in your garage right away. This includes oil or grease spills. What can I do in the bathroom? Use night lights. Install grab bars by the toilet and in the tub and shower. Do not use towel bars as grab bars. Use non-skid mats or decals in the tub or shower. If you need to sit down in the shower, use a plastic, non-slip stool. Keep the floor dry. Clean up any water that spills on the floor as soon as it happens. Remove soap buildup in the tub or shower regularly. Attach bath mats securely with double-sided non-slip rug tape. Do not have throw rugs and other things on the floor that can make you trip. What can I do in the bedroom? Use night lights. Make sure  that you have a light by your bed that is easy to reach. Do not use any sheets or blankets that are too big for your bed. They should not hang down onto the floor. Have a firm chair that has side arms. You can use this for support while you get dressed. Do not have throw rugs and other things on the floor that can make you trip. What can I do in the kitchen? Clean up any spills right away. Avoid walking on wet floors. Keep items that you use a lot in easy-to-reach places. If you need to reach something above you, use a strong step stool that has a grab bar. Keep electrical cords out of the way. Do not use floor polish or wax that makes floors slippery. If you must use wax, use non-skid floor wax. Do not have throw rugs and other things on the floor that can make you trip. What can I do with my stairs? Do not leave any items on the stairs. Make sure that there are handrails on both sides of the stairs and use them. Fix handrails that are broken or loose. Make sure that handrails are as long as the stairways. Check any carpeting to make sure that it is firmly attached to the stairs. Fix any carpet that is loose or worn. Avoid having throw rugs at the top or  bottom of the stairs. If you do have throw rugs, attach them to the floor with carpet tape. Make sure that you have a light switch at the top of the stairs and the bottom of the stairs. If you do not have them, ask someone to add them for you. What else can I do to help prevent falls? Wear shoes that: Do not have high heels. Have rubber bottoms. Are comfortable and fit you well. Are closed at the toe. Do not wear sandals. If you use a stepladder: Make sure that it is fully opened. Do not climb a closed stepladder. Make sure that both sides of the stepladder are locked into place. Ask someone to hold it for you, if possible. Clearly mark and make sure that you can see: Any grab bars or handrails. First and last steps. Where the edge of  each step is. Use tools that help you move around (mobility aids) if they are needed. These include: Canes. Walkers. Scooters. Crutches. Turn on the lights when you go into a dark area. Replace any light bulbs as soon as they burn out. Set up your furniture so you have a clear path. Avoid moving your furniture around. If any of your floors are uneven, fix them. If there are any pets around you, be aware of where they are. Review your medicines with your doctor. Some medicines can make you feel dizzy. This can increase your chance of falling. Ask your doctor what other things that you can do to help prevent falls. This information is not intended to replace advice given to you by your health care provider. Make sure you discuss any questions you have with your health care provider. Document Released: 10/09/2009 Document Revised: 05/20/2016 Document Reviewed: 01/17/2015 Elsevier Interactive Patient Education  2017 [employer].

## 2023-04-26 NOTE — Progress Notes (Signed)
Subjective:   Stephanie Baker is a 82 y.o. female who presents for Medicare Annual (Subsequent) preventive examination.  Review of Systems     Cardiac Risk Factors include: advanced age (>20men, >73 women);sedentary lifestyle;smoking/ tobacco exposure;hypertension     Objective:    Today's Vitals   04/26/23 1547  PainSc: 8    There is no height or weight on file to calculate BMI.     04/26/2023    3:22 PM 04/12/2023    3:11 PM 10/04/2022   10:30 AM 06/08/2022   11:20 AM 06/03/2022   11:06 AM 05/13/2022   10:42 AM 05/10/2022   11:45 AM  Advanced Directives  Does Patient Have a Medical Advance Directive? Yes Yes Yes Yes Yes Yes Yes  Type of Estate agent of [place];Living will;Out of facility DNR (pink MOST or yellow form) Healthcare Power of [place];Living will;Out of facility DNR (pink MOST or yellow form) Healthcare Power of [place];Living will Living will;Healthcare Power of Attorney Living will;Healthcare Power of Attorney Living will;Healthcare Power of Attorney Living will;Healthcare Power of Attorney  Does patient want to make changes to medical advance directive? No - Patient declined No - Patient declined  No - Patient declined No - Patient declined No - Patient declined No - Patient declined  Copy of Healthcare Power of Attorney in Chart? Yes - validated most recent copy scanned in chart (See row information) No - copy requested  Yes - validated most recent copy scanned in chart (See row information) Yes - validated most recent copy scanned in chart (See row information) Yes - validated most recent copy scanned in chart (See row information) Yes - validated most recent copy scanned in chart (See row information)    Current Medications (verified) Outpatient Encounter Medications as of 04/26/2023  Medication Sig   amLODipine (NORVASC) 5 MG tablet TAKE 1 TABLET(5 MG) BY MOUTH DAILY AT 10 PM (Patient taking differently: Take 5 mg by mouth daily.)    Bempedoic Acid-Ezetimibe (NEXLIZET) 180-10 MG TABS TAKE 1 TABLET BY MOUTH EVERY DAY AT 12 NOON.   Biotin 5000 MCG CAPS Take 1 capsule by mouth daily.   Calcium Citrate-Vitamin D (CALCIUM CITRATE + D) 315-5 MG-MCG TABS Take 2 tablets by mouth in the morning and at bedtime.   ELIQUIS 5 MG TABS tablet TAKE 1 TABLET(5 MG) BY MOUTH TWICE DAILY   Evening Primrose Oil CAPS Take 1 capsule by mouth daily at 12 noon.   fluconazole (DIFLUCAN) 100 MG tablet Take 1 tablet (100 mg total) by mouth daily.   ketoconazole (NIZORAL) 2 % shampoo Apply 1 Application topically 2 (two) times a week.   levothyroxine (SYNTHROID) 25 MCG tablet Take 2 tablets (50 mcg total) by mouth daily before breakfast. (Patient taking differently: Take 25 mcg by mouth daily before breakfast.)   Magnesium Citrate 100 MG TABS Take 150 mg by mouth every morning.   Multiple Vitamins-Minerals (MULTIVITAMIN WITH MINERALS) tablet Take 1 tablet by mouth daily.   olmesartan-hydrochlorothiazide (BENICAR HCT) 40-12.5 MG tablet Take 1 tablet by mouth daily before breakfast. For HTN   omega-3 acid ethyl esters (LOVAZA) 1 g capsule Take 2 g by mouth 2 (two) times daily.   potassium chloride SA (KLOR-CON M) 20 MEQ tablet Take 1 tablet (20 mEq total) by mouth daily before breakfast. (Patient taking differently: Take 20 mEq by mouth daily after breakfast.)   RESTASIS 0.05 % ophthalmic emulsion Place 1 drop into both eyes 2 (two) times daily.   Turmeric 500  MG CAPS Take 500 mg by mouth daily.   nitroGLYCERIN (NITROSTAT) 0.4 MG SL tablet Place 1 tablet (0.4 mg total) under the tongue every 5 (five) minutes as needed for chest pain. If you require more than two tablets five minutes apart go to the nearest ER via EMS.   No facility-administered encounter medications on file as of 04/26/2023.    Allergies (verified) Statins   History: Past Medical History:  Diagnosis Date   Arthritis    OA   GERD (gastroesophageal reflux disease)    Hematuria     idiopathic- Dr. [facility]   Hyperlipemia    Hypertension    Hypothyroidism    Past Surgical History:  Procedure Laterality Date   ABDOMINAL HYSTERECTOMY     APPENDECTOMY     BACK SURGERY  2023   lumbar   EYE SURGERY     bilateral cataract extraction with IOL   GASTROC RECESSION EXTREMITY Left 02/23/2018   Procedure: Left Gastroc Recession;  Surgeon: Toni Arthurs, MD;  Location: [facility];  Service: Orthopedics;  Laterality: Left;   HAND SURGERY     left thumb MP   JOINT REPLACEMENT     right knee   PARTIAL KNEE ARTHROPLASTY  11/14/2012   Procedure: UNICOMPARTMENTAL KNEE;  Surgeon: Shelda Pal, MD;  Location: [facility];  Service: Orthopedics;  Laterality: Left;   TARSAL METATARSAL ARTHRODESIS Left 02/23/2018   Procedure: Left First and Second Tarsometatarsal Arthrodesis;  Surgeon: Toni Arthurs, MD;  Location:  SURGERY CENTER;  Service: Orthopedics;  Laterality: Left;   thumb surgery     left   VAGINAL HYSTERECTOMY     Family History  Problem Relation Age of Onset   Heart attack Mother    Healthy Father    Breast cancer Sister 48   Healthy Brother    Alcoholism Brother    Breast cancer Maternal Aunt    Colon cancer Neg Hx    Stomach cancer Neg Hx    Liver cancer Neg Hx    Pancreatic cancer Neg Hx    Rectal cancer Neg Hx    Social History   Socioeconomic History   Marital status: Widowed    Spouse name: Not on file   Number of children: 3   Years of education: Not on file   Highest education level: Not on file  Occupational History   Not on file  Tobacco Use   Smoking status: Former    Types: Cigarettes    Quit date: 12/27/1988    Years since quitting: 34.3   Smokeless tobacco: Never  Vaping Use   Vaping Use: Never used  Substance and Sexual Activity   Alcohol use: Yes    Alcohol/week: 7.0 standard drinks of alcohol    Types: 7 Glasses of wine per week    Comment: wine nightly   1 glass   Drug use: No   Sexual activity: Not  on file  Other Topics Concern   Not on file  Social History Narrative   Not on file   Social Determinants of Health   Financial Resource Strain: Not on file  Food Insecurity: Not on file  Transportation Needs: Not on file  Physical Activity: Not on file  Stress: Not on file  Social Connections: Not on file    Tobacco Counseling Counseling given: Not Answered   Clinical Intake:  Pre-visit preparation completed: No  Pain : 0-10 Pain Score: 8  Pain Type: Chronic pain Pain Location: Back Pain  Orientation: Lower Pain Radiating Towards: No Pain Descriptors / Indicators: Burning, Nagging Pain Onset: More than a month ago Pain Frequency: Constant Pain Relieving Factors: lying on the side Effect of Pain on Daily Activities: yes  Pain Relieving Factors: lying on the side  BMI - recorded: 23.52 Nutritional Status: BMI of 19-24  Normal Nutritional Risks: None Diabetes: No  How often do you need to have someone help you when you read instructions, pamphlets, or other written materials from your doctor or pharmacy?: 1 - Never What is the last grade level you completed in school?: 14 yrs  Diabetic?No   Interpreter Needed?: No      Activities of Daily Living    04/26/2023    3:54 PM 04/26/2023    3:23 PM  In your present state of health, do you have any difficulty performing the following activities:  Hearing? 0 0  Vision? 0 0  Difficulty concentrating or making decisions? 0 0  Walking or climbing stairs? 1 0  Comment uses a walker   Dressing or bathing? 0 0  Doing errands, shopping? 1 0  Comment uses a Physiological scientist and eating ? N N  Using the Toilet? N N  In the past six months, have you accidently leaked urine? N N  Do you have problems with loss of bowel control? N N  Managing your Medications? N N  Managing your Finances? N N  Housekeeping or managing your Housekeeping? [place]  Comment has a housekeeper     Patient Care Team: Mahlon Gammon, MD  as PCP - General (Internal Medicine)  Indicate any recent Medical Services you may have received from other than [facility] providers in the past year (date may be approximate).     Assessment:   This is a routine wellness examination for [place].  Hearing/Vision screen No results found.  Dietary issues and exercise activities discussed: Current Exercise Habits: The patient does not participate in regular exercise at present, Exercise limited by: orthopedic condition(s)   Goals Addressed             This Visit's Progress    Patient Stated       Get rid of back pain        Depression Screen    04/26/2023    3:23 PM  PHQ 2/9 Scores  PHQ - 2 Score 1    Fall Risk    04/26/2023    3:22 PM 04/12/2023    3:11 PM  Fall Risk   Falls in the past year? 0 1  Number falls in past yr: 0 0  Injury with Fall? 0 1  Risk for fall due to : No Fall Risks History of fall(s);Impaired balance/gait;Impaired mobility  Follow up  Falls evaluation completed    FALL RISK PREVENTION PERTAINING TO THE HOME:  Any stairs in or around the home? No  If so, are there any without handrails? No  Home free of loose throw rugs in walkways, pet beds, electrical cords, etc? No  Adequate lighting in your home to reduce risk of falls? Yes   ASSISTIVE DEVICES UTILIZED TO PREVENT FALLS:  Life alert? No  Use of a cane, walker or w/c? Yes  Grab bars in the bathroom? Yes  Shower chair or bench in shower? Yes  Elevated toilet seat or a handicapped toilet? No   TIMED UP AND GO:  Was the test performed? No .  Length of time to ambulate 10 feet: N/A  sec.   Gait slow and steady with assistive device  Cognitive Function:        04/26/2023    3:23 PM  6CIT Screen  What Year? 0 points  What month? 0 points  What time? 0 points  Count back from 20 0 points  Months in reverse 0 points  Repeat phrase 0 points  Total Score 0 points    Immunizations Immunization History  Administered Date(s)  Administered   Fluad Quad(high Dose 65+) 09/28/2022   Influenza,inj,quad, With Preservative 08/29/2017   Influenza-Unspecified 09/26/2016   Moderna Covid-19 Vaccine Bivalent Booster 61yrs & up 10/09/2021   Moderna SARS-COV2 Booster Vaccination 05/31/2022   PFIZER Comirnaty(Gray Top)Covid-19 Tri-Sucrose Vaccine 03/20/2023   PFIZER(Purple Top)SARS-COV-2 Vaccination 09/07/2019, 09/27/2019, 04/16/2020   PNEUMOCOCCAL CONJUGATE-20 08/09/2022   Pfizer Covid-19 Vaccine Bivalent Booster 70yrs & up 09/17/2022   Pneumococcal Polysaccharide-23 11/27/2010   Tdap 12/27/2008   Zoster Recombinat (Shingrix) 07/26/2017, 01/06/2018   Zoster, Live 12/27/2009, 01/15/2010    TDAP status: Due, Education has been provided regarding the importance of this vaccine. Advised may receive this vaccine at local pharmacy or Health Dept. Aware to provide a copy of the vaccination record if obtained from local pharmacy or Health Dept. Verbalized acceptance and understanding.  Flu Vaccine status: Up to date  Pneumococcal vaccine status: Up to date  Covid-19 vaccine status: Completed vaccines  Qualifies for Shingles Vaccine? Yes   Zostavax completed Yes   Shingrix Completed?: Yes  Screening Tests Health Maintenance  Topic Date Due   COVID-19 Vaccine (8 - 2023-24 season) 05/15/2023   INFLUENZA VACCINE  07/28/2023   Medicare Annual Wellness (AWV)  04/25/2024   Pneumonia Vaccine 100+ Years old  Completed   DEXA SCAN  Completed   Zoster Vaccines- Shingrix  Completed   HPV VACCINES  Aged Out   DTaP/Tdap/Td  Discontinued    Health Maintenance  There are no preventive care reminders to display for this patient.   Colorectal cancer screening: No longer required.   Mammogram status: No longer required due to  .  Bone Density status: Completed 11/25/2006. Results reflect: Bone density results: NORMAL. Repeat every 5 years.  Lung Cancer Screening: (Low Dose CT Chest recommended if Age 31-80 years, 30 pack-year  currently smoking OR have quit w/in 15years.) does not qualify.   Lung Cancer Screening Referral: No   Additional Screening:  Hepatitis C Screening: does not qualify; Completed no   Vision Screening: Recommended annual ophthalmology exams for early detection of glaucoma and other disorders of the eye. Is the patient up to date with their annual eye exam?  Yes  Who is the provider or what is the name of the office in which the patient attends annual eye exams? Dr.Tunner  If pt is not established with a provider, would they like to be referred to a provider to establish care? No .   Dental Screening: Recommended annual dental exams for proper oral hygiene  Community Resource Referral / Chronic Care Management: CRR required this visit?  No   CCM required this visit?  No      Plan:     I have personally reviewed and noted the following in the patient's chart:   Medical and social history Use of alcohol, tobacco or illicit drugs  Current medications and supplements including opioid prescriptions. Patient is not currently taking opioid prescriptions. Functional ability and status Nutritional status Physical activity Advanced directives List of other physicians Hospitalizations, surgeries, and ER visits in previous 12 months Vitals  Screenings to include cognitive, depression, and falls Referrals and appointments  In addition, I have reviewed and discussed with patient certain preventive protocols, quality metrics, and best practice recommendations. A written personalized care plan for preventive services as well as general preventive health recommendations were provided to patient.     Caesar Bookman, NP   04/26/2023   Nurse Notes: Due for Tdap vaccine

## 2023-04-27 DIAGNOSIS — H52203 Unspecified astigmatism, bilateral: Secondary | ICD-10-CM | POA: Diagnosis not present

## 2023-04-27 DIAGNOSIS — H04123 Dry eye syndrome of bilateral lacrimal glands: Secondary | ICD-10-CM | POA: Diagnosis not present

## 2023-04-27 DIAGNOSIS — H0100A Unspecified blepharitis right eye, upper and lower eyelids: Secondary | ICD-10-CM | POA: Diagnosis not present

## 2023-04-27 DIAGNOSIS — H02052 Trichiasis without entropian right lower eyelid: Secondary | ICD-10-CM | POA: Diagnosis not present

## 2023-04-27 DIAGNOSIS — H26493 Other secondary cataract, bilateral: Secondary | ICD-10-CM | POA: Diagnosis not present

## 2023-04-27 DIAGNOSIS — H0100B Unspecified blepharitis left eye, upper and lower eyelids: Secondary | ICD-10-CM | POA: Diagnosis not present

## 2023-05-05 DIAGNOSIS — Z981 Arthrodesis status: Secondary | ICD-10-CM | POA: Diagnosis not present

## 2023-05-05 DIAGNOSIS — M461 Sacroiliitis, not elsewhere classified: Secondary | ICD-10-CM | POA: Diagnosis not present

## 2023-05-17 DIAGNOSIS — N281 Cyst of kidney, acquired: Secondary | ICD-10-CM | POA: Diagnosis not present

## 2023-05-17 DIAGNOSIS — R311 Benign essential microscopic hematuria: Secondary | ICD-10-CM | POA: Diagnosis not present

## 2023-05-17 DIAGNOSIS — R3129 Other microscopic hematuria: Secondary | ICD-10-CM | POA: Diagnosis not present

## 2023-06-02 DIAGNOSIS — N281 Cyst of kidney, acquired: Secondary | ICD-10-CM | POA: Diagnosis not present

## 2023-06-02 DIAGNOSIS — R311 Benign essential microscopic hematuria: Secondary | ICD-10-CM | POA: Diagnosis not present

## 2023-06-08 ENCOUNTER — Other Ambulatory Visit: Payer: Self-pay

## 2023-06-08 ENCOUNTER — Telehealth: Payer: Self-pay | Admitting: Cardiology

## 2023-06-08 DIAGNOSIS — I10 Essential (primary) hypertension: Secondary | ICD-10-CM

## 2023-06-08 MED ORDER — OLMESARTAN MEDOXOMIL-HCTZ 40-12.5 MG PO TABS
2.0000 | ORAL_TABLET | Freq: Every day | ORAL | 3 refills | Status: DC
Start: 2023-06-08 — End: 2023-07-12

## 2023-06-08 MED ORDER — OLMESARTAN MEDOXOMIL-HCTZ 40-12.5 MG PO TABS
1.0000 | ORAL_TABLET | Freq: Every day | ORAL | 1 refills | Status: DC
Start: 2023-06-08 — End: 2023-06-08

## 2023-06-08 NOTE — Telephone Encounter (Signed)
sent 

## 2023-06-09 ENCOUNTER — Telehealth: Payer: Self-pay

## 2023-06-09 ENCOUNTER — Encounter: Payer: Self-pay | Admitting: Cardiology

## 2023-06-09 ENCOUNTER — Ambulatory Visit: Payer: Medicare Other | Admitting: Cardiology

## 2023-06-09 VITALS — BP 127/70 | HR 78 | Ht 64.0 in | Wt 139.0 lb

## 2023-06-09 DIAGNOSIS — Z86718 Personal history of other venous thrombosis and embolism: Secondary | ICD-10-CM

## 2023-06-09 DIAGNOSIS — E782 Mixed hyperlipidemia: Secondary | ICD-10-CM | POA: Diagnosis not present

## 2023-06-09 DIAGNOSIS — Z86711 Personal history of pulmonary embolism: Secondary | ICD-10-CM | POA: Diagnosis not present

## 2023-06-09 DIAGNOSIS — I2510 Atherosclerotic heart disease of native coronary artery without angina pectoris: Secondary | ICD-10-CM

## 2023-06-09 DIAGNOSIS — R072 Precordial pain: Secondary | ICD-10-CM | POA: Diagnosis not present

## 2023-06-09 DIAGNOSIS — Z789 Other specified health status: Secondary | ICD-10-CM | POA: Diagnosis not present

## 2023-06-09 DIAGNOSIS — I10 Essential (primary) hypertension: Secondary | ICD-10-CM | POA: Diagnosis not present

## 2023-06-09 DIAGNOSIS — R0609 Other forms of dyspnea: Secondary | ICD-10-CM | POA: Diagnosis not present

## 2023-06-09 DIAGNOSIS — I2584 Coronary atherosclerosis due to calcified coronary lesion: Secondary | ICD-10-CM | POA: Diagnosis not present

## 2023-06-09 DIAGNOSIS — I700 Atherosclerosis of aorta: Secondary | ICD-10-CM

## 2023-06-09 NOTE — Telephone Encounter (Signed)
Gave patient Nexlizet help line for patient assistance. Patient said she will call.

## 2023-06-09 NOTE — Progress Notes (Signed)
ID:  Stephanie Baker, DOB 04-08-1941, MRN [id number]  PCP:  Mahlon Gammon, MD  Cardiologist:  Tessa Lerner, DO, [facility] (established care 02/04/2023)  Date: 06/09/23 Last Office Visit: 03/11/2023  Chief Complaint  Patient presents with   Follow-up    71-month follow-up for reevaluation of chest pain and shortness of breath    HPI  Stephanie Baker is a 82 y.o. Caucasian female whose past medical history and cardiovascular risk factors include: History of coronary and aortic atherosclerosis (nongated CT study 2019), hyperlipidemia, history of intolerance to statin, former smoker (25-pack-year history, quit 33 years ago), history of PE / DVT(postoperative May 2023, currently on anticoagulation), history of pericarditis, hypertension.   Patient was referred to the practice for precordial discomfort.  She is undergoing cardiovascular workup which noted a low risk stress test and echocardiography reports preserved LVEF with normal diastolic function and mild valvular heart disease.  Since then her medical therapy has been uptitrated.  She was also noted to have coronary artery calcification and aortic atherosclerosis on nongated CT studies was recommended and lipid management was recommended.  She was unable to tolerate statin therapy and therefore was started on Nexlizet.  She wanted to avoid PCSK9 inhibitors or Leqvio if possible.  She presents today for follow-up.  She does not have precordial discomfort but shortness of breath is still present.  At the last office visit she was recommended to increase amlodipine 2.5 mg p.o. daily to 5 mg p.o. daily.  However for reasons unknown she is currently taking olmesartan/hydrochlorothiazide 40/12.5 mg p.o. twice daily.  She is also requesting assistance for Nexlizet.   Patient is a retired Adult nurse and her husband was Investment banker, operational within the community.  FUNCTIONAL STATUS: No structured exercise program or daily routine.    ALLERGIES: Allergies  Allergen Reactions   Statins     MEDICATION LIST PRIOR TO VISIT: Current Meds  Medication Sig   amLODipine (NORVASC) 5 MG tablet TAKE 1 TABLET(5 MG) BY MOUTH DAILY AT 10 PM (Patient taking differently: Take 5 mg by mouth daily.)   Bempedoic Acid-Ezetimibe (NEXLIZET) 180-10 MG TABS TAKE 1 TABLET BY MOUTH EVERY DAY AT 12 NOON.   Biotin 5000 MCG CAPS Take 1 capsule by mouth daily.   Calcium Citrate-Vitamin D (CALCIUM CITRATE + D) 315-5 MG-MCG TABS Take 2 tablets by mouth in the morning and at bedtime.   Evening Primrose Oil CAPS Take 1 capsule by mouth daily at 12 noon.   fluconazole (DIFLUCAN) 100 MG tablet Take 1 tablet (100 mg total) by mouth daily.   ketoconazole (NIZORAL) 2 % shampoo Apply 1 Application topically 2 (two) times a week.   levothyroxine (SYNTHROID) 25 MCG tablet Take 2 tablets (50 mcg total) by mouth daily before breakfast. (Patient taking differently: Take 25 mcg by mouth daily before breakfast.)   Magnesium Citrate 100 MG TABS Take 150 mg by mouth every morning.   Multiple Vitamins-Minerals (MULTIVITAMIN WITH MINERALS) tablet Take 1 tablet by mouth daily.   nitroGLYCERIN (NITROSTAT) 0.4 MG SL tablet Place 1 tablet (0.4 mg total) under the tongue every 5 (five) minutes as needed for chest pain. If you require more than two tablets five minutes apart go to the nearest ER via EMS.   olmesartan-hydrochlorothiazide (BENICAR HCT) 40-12.5 MG tablet Take 2 tablets by mouth daily before breakfast. Dose increased (Patient taking differently: Take 1 tablet by mouth daily.)   omega-3 acid ethyl esters (LOVAZA) 1 g capsule Take 2 g by mouth 2 (  two) times daily.   potassium chloride SA (KLOR-CON M) 20 MEQ tablet Take 1 tablet (20 mEq total) by mouth daily before breakfast. (Patient taking differently: Take 20 mEq by mouth daily after breakfast.)   RESTASIS 0.05 % ophthalmic emulsion Place 1 drop into both eyes 2 (two) times daily.   Turmeric 500 MG CAPS Take 500  mg by mouth daily.     PAST MEDICAL HISTORY: Past Medical History:  Diagnosis Date   Arthritis    OA   GERD (gastroesophageal reflux disease)    Hematuria    idiopathic- Dr. [facility]   Hyperlipemia    Hypertension    Hypothyroidism     PAST SURGICAL HISTORY: Past Surgical History:  Procedure Laterality Date   ABDOMINAL HYSTERECTOMY     APPENDECTOMY     BACK SURGERY  2023   lumbar   EYE SURGERY     bilateral cataract extraction with IOL   GASTROC RECESSION EXTREMITY Left 02/23/2018   Procedure: Left Gastroc Recession;  Surgeon: Toni Arthurs, MD;  Location: [facility];  Service: Orthopedics;  Laterality: Left;   HAND SURGERY     left thumb MP   JOINT REPLACEMENT     right knee   PARTIAL KNEE ARTHROPLASTY  11/14/2012   Procedure: UNICOMPARTMENTAL KNEE;  Surgeon: Shelda Pal, MD;  Location: [facility];  Service: Orthopedics;  Laterality: Left;   TARSAL METATARSAL ARTHRODESIS Left 02/23/2018   Procedure: Left First and Second Tarsometatarsal Arthrodesis;  Surgeon: Toni Arthurs, MD;  Location: [facility];  Service: Orthopedics;  Laterality: Left;   thumb surgery     left   VAGINAL HYSTERECTOMY      FAMILY HISTORY: The patient family history includes Alcoholism in her brother; Breast cancer in her maternal aunt; Breast cancer (age of onset: 86) in her sister; Healthy in her brother and father; Heart attack in her mother.  SOCIAL HISTORY:  The patient  reports that she quit smoking about 34 years ago. Her smoking use included cigarettes. She has never used smokeless tobacco. She reports current alcohol use of about 7.0 standard drinks of alcohol per week. She reports that she does not use drugs.  REVIEW OF SYSTEMS: Review of Systems  Cardiovascular:  Positive for chest pain (pressure - see HPI) and dyspnea on exertion. Negative for claudication, irregular heartbeat, leg swelling, near-syncope, orthopnea, palpitations, paroxysmal nocturnal  dyspnea and syncope.  Respiratory:  Negative for shortness of breath.   Hematologic/Lymphatic: Negative for bleeding problem.  Musculoskeletal:  Negative for muscle cramps and myalgias.  Neurological:  Negative for dizziness and light-headedness.    PHYSICAL EXAM:    06/09/2023   11:22 AM 04/12/2023    3:17 PM 03/11/2023   10:02 AM  Vitals with BMI  Height 5\' 4"  5\' 4"  5\' 4"   Weight 139 lbs 137 lbs 136 lbs 6 oz  BMI 23.85 23.5 23.4  Systolic 127 124 161  Diastolic 70 68 71  Pulse 78 67 82    Physical Exam  Constitutional: No distress.  Age appropriate, hemodynamically stable, walks w/ walker  Neck: No JVD present.  Cardiovascular: Normal rate, regular rhythm, S1 normal, S2 normal, intact distal pulses and normal pulses. Exam reveals no gallop, no S3 and no S4.  Murmur heard. Holosystolic murmur is present with a grade of 3/6 at the apex. Pulmonary/Chest: Effort normal and breath sounds normal. No stridor. She has no wheezes. She has no rales.  Abdominal: Soft. Bowel sounds are normal. She exhibits  no distension. There is no abdominal tenderness.  Musculoskeletal:        General: No edema.     Cervical back: Neck supple.  Neurological: She is alert and oriented to person, place, and time. She has intact cranial nerves (2-12).  Skin: Skin is warm and moist.  Easy bruising    CARDIAC DATABASE: EKG: June 09, 2023: Sinus rhythm, 70 bpm, left axis, poor R wave progression, without underlying injury pattern.  Echocardiogram: 08/2018: Mild LVH, LVEF 60-65%, aortic valve sclerosis, moderate AI, mild MAC, mild MR, mild TR, mild PR.  02/11/2023: Normal LV systolic function with visual EF 60-65%. Left ventricle cavity is normal in size. Normal left ventricular wall thickness. Normal global wall motion. Normal diastolic filling pattern, normal LAP.  Mild (Grade I) aortic regurgitation. Mild tricuspid regurgitation. No evidence of pulmonary hypertension. Mild pulmonic  regurgitation. Compared to 08/2018 moderate AR is now mild otherwise no significant change.   Stress Testing: [facility] September 2019: Per report stress ECG negative for ischemia.  No ischemia or previous infarction.  Lexiscan Tetrofosmin stress test 02/22/2023: 1 Day Rest/Stress protocol.  Stress EKG is non-diagnostic, as this is pharmacological stress test using Lexiscan. Normal myocardial perfusion without evidence of reversible ischemia or prior infarct. Left ventricular size is normal, wall thickness preserved, no regionality noted. Calculated LVEF 65% No prior studies available for comparison.  Low risk study.   Heart Catheterization: None  LABORATORY DATA:    Latest Ref Rng & Units 05/17/2022   12:00 AM 11/15/2012    4:25 AM 11/01/2012   10:10 AM  CBC  WBC  4.5     11.4  4.8   Hemoglobin 12.0 - 16.0 10.7     11.9  14.1   Hematocrit 36 - 46 31     34.5  41.2   Platelets 150 - 400 K/uL 423     264  301      This result is from an external source.       Latest Ref Rng & Units 03/14/2023    8:34 AM 02/04/2023   10:22 AM 06/03/2022   12:00 AM  CMP  Glucose 70 - 99 mg/dL 91  098    BUN 8 - 27 mg/dL 27  26  29       Creatinine 0.57 - 1.00 mg/dL 1.19  1.47  0.5      Sodium 134 - 144 mmol/L 142  140  139      Potassium 3.5 - 5.2 mmol/L 3.9  3.8  3.9      Chloride 96 - 106 mmol/L 105  104  105      CO2 20 - 29 mmol/L 21  23  25       Calcium 8.7 - 10.3 mg/dL 9.7  9.4  9.9      Total Protein 6.0 - 8.5 g/dL 6.7     Total Bilirubin 0.0 - 1.2 mg/dL 0.6     Alkaline Phos 44 - 121 IU/L 57   97      AST 0 - 40 IU/L 23   18      ALT 0 - 32 IU/L 14   16         This result is from an external source.    Lipid Panel     Component Value Date/Time   CHOL 141 03/14/2023 0834   TRIG 56 03/14/2023 0834   HDL 76 03/14/2023 0834   LDLCALC 53 03/14/2023 0834   LDLDIRECT 58 03/14/2023  1610   LABVLDL 12 03/14/2023 0834    No components found for: "NTPROBNP" Recent Labs     02/04/23 1022  PROBNP 135   No results for input(s): "TSH" in the last 8760 hours.  BMP Recent Labs    02/04/23 1022 03/14/23 0834  NA 140 142  K 3.8 3.9  CL 104 105  CO2 23 21  GLUCOSE 100* 91  BUN 26 27  CREATININE 0.55* 0.82  CALCIUM 9.4 9.7    HEMOGLOBIN A1C No results found for: "HGBA1C", "MPG"  External Labs: Collected: 07/19/2022 provided by referring physician Total cholesterol 209, triglycerides 73, HDL 85, LDL 109, non-HDL 124 TSH 3.0 BUN 21, creatinine 0.6. Sodium 139, potassium 3.9, chloride 108, bicarb 25. AST 19, ALT 16, alkaline phosphatase 72  IMPRESSION:    ICD-10-CM   1. Precordial pain  R07.2 EKG 12-Lead    2. Calcification of native coronary artery  I25.10    I25.84     3. Atherosclerosis of aorta (HCC)  I70.0     4. Dyspnea on exertion  R06.09     5. Mixed hyperlipidemia  E78.2     6. Statin intolerance  Z78.9     7. Benign hypertension  I10 Basic metabolic panel    8. History of DVT (deep vein thrombosis)  Z86.718     9. Hx pulmonary embolism  Z86.711        RECOMMENDATIONS: Stephanie Baker is a 21 y.o. Caucasian female whose past medical history and cardiac risk factors include: History of coronary and aortic atherosclerosis (nongated CT study 2019), hyperlipidemia, history of intolerance to statin, former smoker (25-pack-year history, quit 33 years ago), history of PE / DVT(postoperative May 2023, currently on anticoagulation), history of pericarditis, hypertension.   Precordial pain Calcification of native coronary artery Atherosclerosis of aorta (HCC) Denies anginal chest pain. No use of sublingual nitroglycerin tablets since the last office visit. Shortness of breath predominantly with exertion still present but no change in intensity frequency or duration. EKG nonischemic. Overall functional capacity remains stable. Echocardiogram: Preserved LVEF, normal diastolic function, mild valvular heart disease. Lexiscan: Low  risk study Focused on improving her modifiable cardiovascular risk factors. Blood pressures are well-controlled. Due to intolerance to statins she is currently on Nexlizet.  Dyspnea on exertion Not in overt heart failure. Has improved as her blood pressures have improved but the symptoms are still present. She has undergone appropriate ischemic workup as outlined above. Will defer additional workup of her dyspnea to PCP, consider pulmonary evaluation if clinically warranted.   Mixed hyperlipidemia Statin intolerance History of statin intolerance  Did not want to be on Leqvio or Repatha. Currently on Nexlizet and tolerating it well.  Lipids have improved. We have applied for patient assistance which should help cut the cost.  Benign hypertension Office blood pressures are very well-controlled. Continue current medical therapy. Last office visit amlodipine was increased to 5 mg p.o. daily.   However, in addition to uptitration of amlodipine patient has been taking olmesartan/hydrochlorothiazide 40/12.5 mg p.o. twice daily.  She is unsure why she is doing it.  Review of medications also states she is on potassium supplementation.  Given the increased dose of ARB and potassium supplementation we will check BMP to evaluate renal function and potassium levels.  Patient is advised to go to [facility] after today's office visit to have them done.   Monitor for now  FINAL MEDICATION LIST END OF ENCOUNTER: No orders of the defined types were placed in this  encounter.   Medications Discontinued During This Encounter  Medication Reason   ELIQUIS 5 MG TABS tablet      Current Outpatient Medications:    amLODipine (NORVASC) 5 MG tablet, TAKE 1 TABLET(5 MG) BY MOUTH DAILY AT 10 PM (Patient taking differently: Take 5 mg by mouth daily.), Disp: 90 tablet, Rfl: 1   Bempedoic Acid-Ezetimibe (NEXLIZET) 180-10 MG TABS, TAKE 1 TABLET BY MOUTH EVERY DAY AT 12 NOON., Disp: 30 tablet, Rfl: 3   Biotin 5000  MCG CAPS, Take 1 capsule by mouth daily., Disp: , Rfl:    Calcium Citrate-Vitamin D (CALCIUM CITRATE + D) 315-5 MG-MCG TABS, Take 2 tablets by mouth in the morning and at bedtime., Disp: , Rfl:    Evening Primrose Oil CAPS, Take 1 capsule by mouth daily at 12 noon., Disp: , Rfl:    fluconazole (DIFLUCAN) 100 MG tablet, Take 1 tablet (100 mg total) by mouth daily., Disp: 3 tablet, Rfl: 0   ketoconazole (NIZORAL) 2 % shampoo, Apply 1 Application topically 2 (two) times a week., Disp: , Rfl:    levothyroxine (SYNTHROID) 25 MCG tablet, Take 2 tablets (50 mcg total) by mouth daily before breakfast. (Patient taking differently: Take 25 mcg by mouth daily before breakfast.), Disp: 30 tablet, Rfl: 0   Magnesium Citrate 100 MG TABS, Take 150 mg by mouth every morning., Disp: , Rfl:    Multiple Vitamins-Minerals (MULTIVITAMIN WITH MINERALS) tablet, Take 1 tablet by mouth daily., Disp: , Rfl:    nitroGLYCERIN (NITROSTAT) 0.4 MG SL tablet, Place 1 tablet (0.4 mg total) under the tongue every 5 (five) minutes as needed for chest pain. If you require more than two tablets five minutes apart go to the nearest ER via EMS., Disp: 30 tablet, Rfl: 0   olmesartan-hydrochlorothiazide (BENICAR HCT) 40-12.5 MG tablet, Take 2 tablets by mouth daily before breakfast. Dose increased (Patient taking differently: Take 1 tablet by mouth daily.), Disp: 180 tablet, Rfl: 3   omega-3 acid ethyl esters (LOVAZA) 1 g capsule, Take 2 g by mouth 2 (two) times daily., Disp: , Rfl:    potassium chloride SA (KLOR-CON M) 20 MEQ tablet, Take 1 tablet (20 mEq total) by mouth daily before breakfast. (Patient taking differently: Take 20 mEq by mouth daily after breakfast.), Disp: 30 tablet, Rfl: 0   RESTASIS 0.05 % ophthalmic emulsion, Place 1 drop into both eyes 2 (two) times daily., Disp: , Rfl:    Turmeric 500 MG CAPS, Take 500 mg by mouth daily., Disp: , Rfl:   Orders Placed This Encounter  Procedures   Basic metabolic panel   EKG 12-Lead     There are no Patient Instructions on file for this visit.   --Continue cardiac medications as reconciled in final medication list. --Return in about 1 year (around 06/08/2024) for Annual follow up visit, Dyspnea. or sooner if needed. --Continue follow-up with your primary care physician regarding the management of your other chronic comorbid conditions.  Patient's questions and concerns were addressed to her satisfaction. She voices understanding of the instructions provided during this encounter.   This note was created using a voice recognition software as a result there may be grammatical errors inadvertently enclosed that do not reflect the nature of this encounter. Every attempt is made to correct such errors.  Tessa Lerner, [place], [facility]  Pager:  [phone] Office: [phone]

## 2023-06-10 ENCOUNTER — Other Ambulatory Visit: Payer: Self-pay

## 2023-06-10 ENCOUNTER — Telehealth: Payer: Self-pay

## 2023-06-10 DIAGNOSIS — I2510 Atherosclerotic heart disease of native coronary artery without angina pectoris: Secondary | ICD-10-CM

## 2023-06-10 DIAGNOSIS — I700 Atherosclerosis of aorta: Secondary | ICD-10-CM

## 2023-06-10 DIAGNOSIS — Z789 Other specified health status: Secondary | ICD-10-CM

## 2023-06-10 LAB — BASIC METABOLIC PANEL
BUN/Creatinine Ratio: 37 — ABNORMAL HIGH (ref 12–28)
BUN: 30 mg/dL — ABNORMAL HIGH (ref 8–27)
CO2: 21 mmol/L (ref 20–29)
Calcium: 9.9 mg/dL (ref 8.7–10.3)
Chloride: 105 mmol/L (ref 96–106)
Creatinine, Ser: 0.82 mg/dL (ref 0.57–1.00)
Glucose: 91 mg/dL (ref 70–99)
Potassium: 4.6 mmol/L (ref 3.5–5.2)
Sodium: 140 mmol/L (ref 134–144)
eGFR: 71 mL/min/{1.73_m2} (ref >59)

## 2023-06-10 MED ORDER — NEXLIZET 180-10 MG PO TABS
ORAL_TABLET | ORAL | 6 refills | Status: DC
Start: 2023-06-10 — End: 2024-01-23

## 2023-06-10 NOTE — Telephone Encounter (Signed)
S/w patient nexlizet has been sent

## 2023-06-10 NOTE — Telephone Encounter (Signed)
PATIENT LEFT MESSAGE TO CALL HER BACK. I tried to call her call went to vm.

## 2023-06-10 NOTE — Progress Notes (Signed)
Patient aware.

## 2023-06-21 ENCOUNTER — Telehealth: Payer: Self-pay

## 2023-06-21 NOTE — Telephone Encounter (Signed)
OLM MED/HCTZ TAB 40-12.5 (use as directed: 2 tablets per day) is denied for not meeting the quantity limit requirement

## 2023-06-21 NOTE — Telephone Encounter (Signed)
This does not seem correct. Please call the patient and verify the dose and med and frequency.   Stephanie Channing [place], DO, [facility]

## 2023-07-07 DIAGNOSIS — I10 Essential (primary) hypertension: Secondary | ICD-10-CM | POA: Diagnosis not present

## 2023-07-07 LAB — COMPREHENSIVE METABOLIC PANEL
Albumin: 4.2 (ref 3.5–5.0)
Calcium: 9.7 (ref 8.7–10.7)
Globulin: 2.4
eGFR: 76

## 2023-07-07 LAB — HEPATIC FUNCTION PANEL
ALT: 11 U/L (ref 7–35)
AST: 31 (ref 13–35)
Alkaline Phosphatase: 52 (ref 25–125)
Bilirubin, Total: 0.3

## 2023-07-07 LAB — BASIC METABOLIC PANEL
BUN: 35 — AB (ref 4–21)
CO2: 20 (ref 13–22)
Chloride: 104 (ref 99–108)
Creatinine: 0.8 (ref 0.5–1.1)
Glucose: 93
Potassium: 4.6 mEq/L (ref 3.5–5.1)
Sodium: 141 (ref 137–147)

## 2023-07-07 LAB — CBC: RBC: 4.08 (ref 3.87–5.11)

## 2023-07-07 LAB — CBC AND DIFFERENTIAL
HCT: 39 (ref 36–46)
Hemoglobin: 12.9 (ref 12.0–16.0)
Platelets: 370 10*3/uL (ref 150–400)
WBC: 6.2

## 2023-07-07 LAB — TSH: TSH: 2.76 (ref 0.41–5.90)

## 2023-07-12 ENCOUNTER — Encounter: Payer: Self-pay | Admitting: Internal Medicine

## 2023-07-12 ENCOUNTER — Non-Acute Institutional Stay: Payer: Medicare Other | Admitting: Internal Medicine

## 2023-07-12 VITALS — BP 118/68 | HR 73 | Temp 98.6°F | Resp 15 | Ht 64.0 in | Wt 135.0 lb

## 2023-07-12 DIAGNOSIS — R072 Precordial pain: Secondary | ICD-10-CM | POA: Diagnosis not present

## 2023-07-12 DIAGNOSIS — I10 Essential (primary) hypertension: Secondary | ICD-10-CM | POA: Diagnosis not present

## 2023-07-12 DIAGNOSIS — E785 Hyperlipidemia, unspecified: Secondary | ICD-10-CM

## 2023-07-12 DIAGNOSIS — R0609 Other forms of dyspnea: Secondary | ICD-10-CM | POA: Diagnosis not present

## 2023-07-12 DIAGNOSIS — R300 Dysuria: Secondary | ICD-10-CM

## 2023-07-12 DIAGNOSIS — E039 Hypothyroidism, unspecified: Secondary | ICD-10-CM | POA: Diagnosis not present

## 2023-07-12 DIAGNOSIS — B3731 Acute candidiasis of vulva and vagina: Secondary | ICD-10-CM | POA: Diagnosis not present

## 2023-07-12 MED ORDER — CLOTRIMAZOLE 1 % EX CREA: 1.0000 | TOPICAL_CREAM | Freq: Two times a day (BID) | CUTANEOUS | 0 refills | Status: DC

## 2023-07-12 MED ORDER — OLMESARTAN MEDOXOMIL-HCTZ 40-12.5 MG PO TABS: 1.0000 | ORAL_TABLET | Freq: Every day | ORAL | 3 refills | Status: DC

## 2023-07-12 MED ORDER — LEVOTHYROXINE SODIUM 25 MCG PO TABS: 25.0000 ug | ORAL_TABLET | Freq: Every day | ORAL | Status: DC

## 2023-07-13 DIAGNOSIS — R300 Dysuria: Secondary | ICD-10-CM | POA: Diagnosis not present

## 2023-07-13 NOTE — Progress Notes (Signed)
Location: [facility] features editor of Service:  Clinic (12)  Provider:   Code Status:  Goals of Care:     07/12/2023    1:49 PM  Advanced Directives  Does Patient Have a Medical Advance Directive? Yes  Type of Advance Directive Living will;Healthcare Power of [place];Out of facility DNR (pink MOST or yellow form)  Does patient want to make changes to medical advance directive? No - Patient declined  Copy of Healthcare Power of Attorney in Chart? Yes - validated most recent copy scanned in chart (See row information)     Chief Complaint  Patient presents with   Medical Management of Chronic Issues    Patient is here for a 3 month follow up. Bad back pain and some depression PHQ9=10     HPI: Patient is a 82 y.o. female seen today for Follow up  [place]  Acute issues Urinary frequency with Dysuria Recurrent symptoms Was seen by urology for Blood in urine She says they did not do anything She says it hurts inher bottom   Lower Back Pain Patient had Lumbar Fusion done in May 2023  Her Post op was complicated by Multiple PE and Right Common Femoral Vein DVT Her pain is still there in lower back  She is not taking anything like tylenol Walks with her walker and uses Power chair outside Is off Eliquis now Chest pain With SOB Sees Cardiology Echo normal Lexiscan Low risk study Statins and BP control      Past Medical History:  Diagnosis Date   Arthritis    OA   GERD (gastroesophageal reflux disease)    Hematuria    idiopathic- Dr. Mena Goes [facility]rology   Hyperlipemia    Hypertension    Hypothyroidism     Past Surgical History:  Procedure Laterality Date   ABDOMINAL HYSTERECTOMY     APPENDECTOMY     BACK SURGERY  2023   lumbar   EYE SURGERY     bilateral cataract extraction with IOL   GASTROC RECESSION EXTREMITY Left 02/23/2018   Procedure: Left Gastroc Recession;  Surgeon: Toni Arthurs, MD;  Location: [facility];   Service: Orthopedics;  Laterality: Left;   HAND SURGERY     left thumb MP   JOINT REPLACEMENT     right knee   PARTIAL KNEE ARTHROPLASTY  11/14/2012   Procedure: UNICOMPARTMENTAL KNEE;  Surgeon: Shelda Pal, MD;  Location: [facility];  Service: Orthopedics;  Laterality: Left;   TARSAL METATARSAL ARTHRODESIS Left 02/23/2018   Procedure: Left First and Second Tarsometatarsal Arthrodesis;  Surgeon: Toni Arthurs, MD;  Location: [facility];  Service: Orthopedics;  Laterality: Left;   thumb surgery     left   VAGINAL HYSTERECTOMY      Allergies  Allergen Reactions   Statins     Outpatient Encounter Medications as of 07/12/2023  Medication Sig   amLODipine (NORVASC) 5 MG tablet TAKE 1 TABLET(5 MG) BY MOUTH DAILY AT 10 PM (Patient taking differently: Take 5 mg by mouth daily.)   Bempedoic Acid-Ezetimibe (NEXLIZET) 180-10 MG TABS TAKE 1 TABLET BY MOUTH EVERY DAY AT 12 NOON.   Biotin 5000 MCG CAPS Take 1 capsule by mouth daily.   Calcium Citrate-Vitamin D (CALCIUM CITRATE + D) 315-5 MG-MCG TABS Take 2 tablets by mouth in the morning and at bedtime.   clotrimazole (CLOTRIMAZOLE ANTI-FUNGAL) 1 % cream Apply 1 Application topically 2 (two) times daily.   Evening Primrose  Oil CAPS Take 1 capsule by mouth daily at 12 noon.   ketoconazole (NIZORAL) 2 % shampoo Apply 1 Application topically 2 (two) times a week.   levothyroxine (SYNTHROID) 25 MCG tablet Take 1 tablet (25 mcg total) by mouth daily.   Magnesium Citrate 100 MG TABS Take 150 mg by mouth every morning.   Multiple Vitamins-Minerals (MULTIVITAMIN WITH MINERALS) tablet Take 1 tablet by mouth daily.   olmesartan-hydrochlorothiazide (BENICAR HCT) 40-12.5 MG tablet Take 1 tablet by mouth daily.   omega-3 acid ethyl esters (LOVAZA) 1 g capsule Take 2 g by mouth 2 (two) times daily.   potassium chloride SA (KLOR-CON M) 20 MEQ tablet Take 1 tablet (20 mEq total) by mouth daily before breakfast. (Patient taking differently: Take 20 mEq  by mouth daily after breakfast.)   RESTASIS 0.05 % ophthalmic emulsion Place 1 drop into both eyes 2 (two) times daily.   Turmeric 500 MG CAPS Take 500 mg by mouth daily.   [DISCONTINUED] fluconazole (DIFLUCAN) 100 MG tablet Take 1 tablet (100 mg total) by mouth daily.   [DISCONTINUED] levothyroxine (SYNTHROID) 25 MCG tablet Take 2 tablets (50 mcg total) by mouth daily before breakfast. (Patient taking differently: Take 25 mcg by mouth daily before breakfast.)   [DISCONTINUED] olmesartan-hydrochlorothiazide (BENICAR HCT) 40-12.5 MG tablet Take 2 tablets by mouth daily before breakfast. Dose increased (Patient taking differently: Take 1 tablet by mouth daily.)   nitroGLYCERIN (NITROSTAT) 0.4 MG SL tablet Place 1 tablet (0.4 mg total) under the tongue every 5 (five) minutes as needed for chest pain. If you require more than two tablets five minutes apart go to the nearest ER via EMS.   No facility-administered encounter medications on file as of 07/12/2023.    Review of Systems:  Review of Systems  Constitutional:  Negative for activity change and appetite change.  HENT: Negative.    Respiratory:  Negative for cough and shortness of breath.   Cardiovascular:  Negative for leg swelling.  Gastrointestinal:  Negative for constipation.  Genitourinary:  Positive for dysuria, frequency and urgency.  Musculoskeletal:  Positive for back pain and gait problem. Negative for arthralgias and myalgias.  Skin: Negative.   Neurological:  Positive for weakness. Negative for dizziness.  Psychiatric/Behavioral:  Negative for confusion, dysphoric mood and sleep disturbance.     Health Maintenance  Topic Date Due   COVID-19 Vaccine (7 - 2023-24 season) 07/28/2023 (Originally 05/15/2023)   INFLUENZA VACCINE  07/28/2023   Medicare Annual Wellness (AWV)  04/25/2024   Pneumonia Vaccine 12+ Years old  Completed   DEXA SCAN  Completed   Zoster Vaccines- Shingrix  Completed   HPV VACCINES  Aged Out   DTaP/Tdap/Td   Discontinued    Physical Exam: Vitals:   07/12/23 1345  BP: 118/68  Pulse: 73  Resp: 15  Temp: 98.6 F (37 C)  TempSrc: Temporal  SpO2: 95%  Weight: 135 lb (61.2 kg)  Height: 5\' 4"  (1.626 m)   Body mass index is 23.17 kg/m. Physical Exam Vitals reviewed.  Constitutional:      Appearance: Normal appearance.  HENT:     Head: Normocephalic.     Nose: Nose normal.     Mouth/Throat:     Mouth: Mucous membranes are moist.     Pharynx: Oropharynx is clear.  Eyes:     Pupils: Pupils are equal, round, and reactive to light.  Cardiovascular:     Rate and Rhythm: Normal rate and regular rhythm.     Pulses: Normal pulses.  Heart sounds: Murmur heard.  Pulmonary:     Effort: Pulmonary effort is normal.     Breath sounds: Normal breath sounds.  Abdominal:     General: Abdomen is flat. Bowel sounds are normal.     Palpations: Abdomen is soft.  Genitourinary:    Comments: Some redness around the vaginal opening where she is /o pain  Musculoskeletal:        General: No swelling.     Cervical back: Neck supple.  Skin:    General: Skin is warm.  Neurological:     General: No focal deficit present.     Mental Status: She is alert and oriented to person, place, and time.     Comments: Gait unstable Walks holding furniture  Psychiatric:        Mood and Affect: Mood normal.        Thought Content: Thought content normal.     Labs reviewed: Basic Metabolic Panel: Recent Labs    02/04/23 1022 03/14/23 0834 06/09/23 1257 07/07/23 0000  NA 140 142 140 141  K 3.8 3.9 4.6 4.6  CL 104 105 105 104  CO2 23 21 21 20   GLUCOSE 100* 91 91  --   BUN 26 27 30* 35*  CREATININE 0.55* 0.82 0.82 0.8  CALCIUM 9.4 9.7 9.9 9.7  MG 2.0  --   --   --   TSH  --   --   --  2.76   Liver Function Tests: Recent Labs    03/14/23 0834 07/07/23 0000  AST 23 31  ALT 14 11  ALKPHOS 57 52  BILITOT 0.6  --   PROT 6.7  --   ALBUMIN 4.3 4.2   No results for input(s): "LIPASE",  "AMYLASE" in the last 8760 hours. No results for input(s): "AMMONIA" in the last 8760 hours. CBC: Recent Labs    07/07/23 0000  WBC 6.2  HGB 12.9  HCT 39  PLT 370   Lipid Panel: Recent Labs    03/14/23 0834  CHOL 141  HDL 76  LDLCALC 53  TRIG 56  LDLDIRECT 58   No results found for: "HGBA1C"  Procedures since last visit: No results found.  Assessment/Plan 1. Dysuria Will Check UA and Culture She was seen by urology Last time due to Blood in Urine but no new recommendations were made  2. Vaginal candidiasis Very mild Does not explain her pain Will Try some Clotrimazole   3. Primary hypertension Norvasc, Benicar BP is good  4. Acquired hypothyroidism TSH normal   5. Precordial chest pain Work up Negative per Cardiology They recommended Pulmonary if Symptoms continue ar she has SOB She is refusing right now  6. Hyperlipidemia, unspecified hyperlipidemia type On Nexlizet  7. Dyspnea on exertion Refusing Pulmonary eval as suggested by Cardiology right now  Labs/tests ordered:  UA and Culture Next appt:  10/17/2023

## 2023-07-22 ENCOUNTER — Telehealth: Payer: Self-pay | Admitting: Internal Medicine

## 2023-07-22 NOTE — Therapy (Incomplete)
OUTPATIENT PHYSICAL THERAPY THORACOLUMBAR EVALUATION   Patient Name: Stephanie Baker MRN: [id number] DOB:[birth year], 82 y.o., female Today's Date: 07/23/2023  END OF SESSION:  PT End of Session - 07/23/23 0950     Visit Number 1    Number of Visits 5    Date for PT Re-Evaluation 09/02/23    Authorization Type Mcr    PT Start Time 0916    PT Stop Time 0955    PT Time Calculation (min) 39 min    Activity Tolerance Patient tolerated treatment well    Behavior During Therapy WFL for tasks assessed/performed             Past Medical History:  Diagnosis Date   Arthritis    OA   GERD (gastroesophageal reflux disease)    Hematuria    idiopathic- Dr. Mena Goes Urology   Hyperlipemia    Hypertension    Hypothyroidism    Past Surgical History:  Procedure Laterality Date   ABDOMINAL HYSTERECTOMY     APPENDECTOMY     BACK SURGERY  2023   lumbar   EYE SURGERY     bilateral cataract extraction with IOL   GASTROC RECESSION EXTREMITY Left 02/23/2018   Procedure: Left Gastroc Recession;  Surgeon: Toni Arthurs, MD;  Location: [facility];  Service: Orthopedics;  Laterality: Left;   HAND SURGERY     left thumb MP   JOINT REPLACEMENT     right knee   PARTIAL KNEE ARTHROPLASTY  11/14/2012   Procedure: UNICOMPARTMENTAL KNEE;  Surgeon: Shelda Pal, MD;  Location: [facility];  Service: Orthopedics;  Laterality: Left;   TARSAL METATARSAL ARTHRODESIS Left 02/23/2018   Procedure: Left First and Second Tarsometatarsal Arthrodesis;  Surgeon: Toni Arthurs, MD;  Location: [facility];  Service: Orthopedics;  Laterality: Left;   thumb surgery     left   VAGINAL HYSTERECTOMY     Patient Active Problem List   Diagnosis Date Noted   History of pulmonary embolism 02/02/2023   Multiple subsegmental pulmonary emboli without acute cor pulmonale (HCC) 06/08/2022   Acute deep vein thrombosis (DVT) of femoral vein of right lower extremity (HCC) 06/08/2022    Acquired hypothyroidism 06/08/2022   Slow transit constipation 06/08/2022   Spinal stenosis of thoracolumbar region 06/08/2022   Primary hypertension 06/08/2022   Anxiety disorder 02/04/2020   Common cold 03/01/2019   Open wound of left lower leg 10/09/2018   Dyspnea 08/02/2018   Adjustment disorder with depressed mood 03/15/2017   Abnormal weight gain 03/09/2016   Encounter for general adult medical examination without abnormal findings 03/03/2016   Degeneration of lumbar intervertebral disc 11/28/2014   Asymptomatic microscopic hematuria 12/17/2013   Nontoxic single thyroid nodule 10/02/2013   S/P left UKR 11/15/2012   Expected blood loss anemia 11/15/2012   Overweight (BMI 25.0-29.9) 11/15/2012   Current use of long term anticoagulation 11/14/2009   Disorder of pericardium 11/07/2009   Gastroduodenitis 11/07/2009   Hyperlipidemia 11/07/2009    PCP: Mahlon Gammon, MD   REFERRING PROVIDER: Maisie Fus, PA-C   REFERRING DIAG:  Arthrodesis status  M46.1 (ICD-10-CM) - Sacroiliitis, not elsewhere classified    Rationale for Evaluation and Treatment: Rehabilitation  THERAPY DIAG:  Other low back pain  Muscle weakness (generalized)  ONSET DATE: early-mid 2022, surgery May 2023   SUBJECTIVE:  SUBJECTIVE STATEMENT: I go down to the exercise room at [facility] 1-3 week. No real changes, pain is the same.  I do not know what to do in the pool. I did have some short relief of pain while in pool last episode.  PERTINENT HISTORY:  Bil partial TKA, h/o DVT   CLIFFORD DAUB, PA 05/05/23 ". . .no evidence of stress fracture or hardware loosening. She does have adjacent segment degenerative changes above her construct however this is not in the region of her pain. Her pain continues to be focused around the  sacral base so Dr. Corbin Ade advised this is likely muscular in nature as she did have significant paraspinal atrophy prior to her surgery and has likely persisted. Encouraged her to begin another trial of aquatic therapy and advised that this may be something she needs to do on an ongoing basis.   PAIN:  Are you having pain? Yes: NPRS scale: 6//10 Pain location: mid back Pain description: constant Aggravating factors: sitting and standing Relieving factors: lyingdown and water submersion  PRECAUTIONS: Fall   WEIGHT BEARING RESTRICTIONS: No  FALLS:  Has patient fallen in last 6 months? No  LIVING ENVIRONMENT: Lives in: Other [facility] Has following equipment at home: Dan Humphreys - 2 wheeled and Wheelchair (power)  OCCUPATION: retired PT  PLOF: [place] 2 wheel[place] in [place] at [employer]  PATIENT GOALS: decrease pain   OBJECTIVE:   DIAGNOSTIC FINDINGS:  04/01/23 IMPRESSION: 1. Increased radiotracer uptake localizes to the endplates at T9-10 and T10-11. On the corresponding CT images there is marked degenerative disc disease at these levels. No underlying fracture identified. 2. Status post posterior hardware fixation of T11 through L4 with interbody fusion at L3-4. 3. Aortic Atherosclerosis (ICD10-I70.0). Coronary artery calcifications.  PATIENT SURVEYS:  FOTO risk adjusted 47% with goal of 48%   POSTURE: Anterior shift of pelvis with counter balance of shoulder posteriorly shifted   LOWER EXTREMITY MMT:    MMT(age norm goal)  Right eval Left eval  Hip flexion (21) 25.4 25.0  Hip extension    Hip abduction 22.8 17.0  Hip adduction    Hip internal rotation    Hip external rotation    Knee flexion    Knee extension (46)    Ankle dorsiflexion    Ankle plantarflexion    Ankle inversion    Ankle eversion     (Blank rows = not tested)    FUNCTIONAL TESTS:  5 times sit to stand: 16.26 from armed chair Timed up and go (TUG): 18.13 Feet together x 10  s  GAIT: Distance walked: 244ft Assistive device utilized: Environmental consultant - 2 wheeled Level of assistance: Complete Independence Comments: good cadence, decreased step length, forward flexed positioning  TODAY'S TREATMENT:                                                                                                                              Eval Functional testing Foto   PATIENT  EDUCATION:  Education details: Discussed eval findings, rehab rationale, aquatic program progression/POC and pools in area. Patient is in agreement  Person educated: Patient Education method: Explanation Education comprehension: verbalized understanding  HOME EXERCISE PROGRAM: [id number] assigned 02/03/23  ASSESSMENT:  CLINICAL IMPRESSION: Patient is a 82 y.o. f who was seen today for physical therapy evaluation and treatment for chronic LBP. Pt is well known to this clinic as she was DC in feb of this year after 6 months of intervention.  She did not make it to her last appointment where she would have been issued her aquatic HEP.  She [place] access [place].  Testing today as compared to last episdoe indicates she has maintained a majority of her strength and mobility.  She continues to exercises at gym at [facility] 1-3 times weekly. Main limitation is chronic LBP.  She will benefit from a short episode of aquatic therapy to instruct on   OBJECTIVE IMPAIRMENTS: Abnormal gait, decreased activity tolerance, decreased strength, increased muscle spasms, impaired flexibility, impaired sensation, improper body mechanics, postural dysfunction, and pain   ACTIVITY LIMITATIONS: carrying, lifting, bending, sitting, standing, squatting, and locomotion level  PARTICIPATION LIMITATIONS: meal prep, cleaning, laundry, shopping, and community activity  PERSONAL FACTORS: Age, Past/current experiences, and Time since onset of injury/illness/exacerbation are also affecting patient's functional outcome.   REHAB  POTENTIAL: Good  CLINICAL DECISION MAKING: Evolving/moderate complexity  EVALUATION COMPLEXITY: Moderate   GOALS: Goals reviewed with patient? Yes  SHORT TERM GOALS: Target date: 09/02/23  Pt will tolerate full aquatic sessions consistently without increase in pain and with improving function to demonstrate good toleration and effectiveness of intervention.   Baseline: Goal status: INITIAL  2.  Pt will be indep with final HEP's (land and aquatic as appropriate) for continued management of condition Baseline:  Goal status: INITIAL  3.  Pt will report decrease in LBP while submerged with residual relief last a few hours post session. Baseline: constant 6/10 Goal status: INITIAL   LONG TERM GOALS: not applicable  PLAN:  PT FREQUENCY: 1x/week  PT DURATION: 6 weeks 5 visits total  PLANNED INTERVENTIONS: Therapeutic exercises, Therapeutic activity, Neuromuscular re-education, Balance training, Gait training, Patient/Family education, Self Care, Joint mobilization, Stair training, Orthotic/Fit training, DME instructions, Aquatic Therapy, Dry Needling, Cryotherapy, Moist heat, Taping, Ionotophoresis 4mg /ml Dexamethasone, Manual therapy, and Re-evaluation.  PLAN FOR NEXT SESSION: aquatics for instruction, creation and issuance of HEP for general strengthening and stretching LB   Geni Bers, PT 07/23/2023, 9:52 AM

## 2023-07-22 NOTE — Telephone Encounter (Signed)
[facility] called and said they needed the patient to sign an ROI in order for them to send their medical records to [place]. I mailed the patient (who lives in [place]) our ROI for her to sign.

## 2023-07-23 ENCOUNTER — Other Ambulatory Visit: Payer: Self-pay

## 2023-07-23 ENCOUNTER — Encounter (HOSPITAL_BASED_OUTPATIENT_CLINIC_OR_DEPARTMENT_OTHER): Payer: Self-pay | Admitting: Physical Therapy

## 2023-07-23 ENCOUNTER — Ambulatory Visit (HOSPITAL_BASED_OUTPATIENT_CLINIC_OR_DEPARTMENT_OTHER): Payer: Medicare Other | Attending: Orthopedic Surgery | Admitting: Physical Therapy

## 2023-07-23 DIAGNOSIS — R262 Difficulty in walking, not elsewhere classified: Secondary | ICD-10-CM | POA: Insufficient documentation

## 2023-07-23 DIAGNOSIS — M6281 Muscle weakness (generalized): Secondary | ICD-10-CM | POA: Insufficient documentation

## 2023-07-23 DIAGNOSIS — M5459 Other low back pain: Secondary | ICD-10-CM | POA: Diagnosis not present

## 2023-07-26 ENCOUNTER — Ambulatory Visit (HOSPITAL_BASED_OUTPATIENT_CLINIC_OR_DEPARTMENT_OTHER): Payer: Medicare Other | Admitting: Physical Therapy

## 2023-07-26 ENCOUNTER — Encounter (HOSPITAL_BASED_OUTPATIENT_CLINIC_OR_DEPARTMENT_OTHER): Payer: Self-pay | Admitting: Physical Therapy

## 2023-07-26 DIAGNOSIS — M6281 Muscle weakness (generalized): Secondary | ICD-10-CM | POA: Diagnosis not present

## 2023-07-26 DIAGNOSIS — M5459 Other low back pain: Secondary | ICD-10-CM | POA: Diagnosis not present

## 2023-07-26 DIAGNOSIS — R262 Difficulty in walking, not elsewhere classified: Secondary | ICD-10-CM | POA: Diagnosis not present

## 2023-07-26 NOTE — Therapy (Signed)
OUTPATIENT PHYSICAL THERAPY THORACOLUMBAR EVALUATION   Patient Name: Stephanie Baker MRN: [id number] DOB:March 16, 1941, 82 y.o., female Today's Date: 07/26/2023  END OF SESSION:  PT End of Session - 07/26/23 1208     Visit Number 2    Number of Visits 5    Date for PT Re-Evaluation 09/02/23    Authorization Type Mcr    PT Start Time 1203    PT Stop Time 1245    PT Time Calculation (min) 42 min    Activity Tolerance Patient tolerated treatment well    Behavior During Therapy WFL for tasks assessed/performed             Past Medical History:  Diagnosis Date   Arthritis    OA   GERD (gastroesophageal reflux disease)    Hematuria    idiopathic- Dr. Mena [facility]es [facility]   Hyperlipemia    Hypertension    Hypothyroidism    Past Surgical History:  Procedure Laterality Date   ABDOMINAL HYSTERECTOMY     APPENDECTOMY     BACK SURGERY  2023   lumbar   EYE SURGERY     bilateral cataract extraction with IOL   GASTROC RECESSION EXTREMITY Left 02/23/2018   Procedure: Left Gastroc Recession;  Surgeon: Toni Arthurs, MD;  Location: [facility];  Service: Orthopedics;  Laterality: Left;   HAND SURGERY     left thumb MP   JOINT REPLACEMENT     right knee   PARTIAL KNEE ARTHROPLASTY  11/14/2012   Procedure: UNICOMPARTMENTAL KNEE;  Surgeon: Shelda Pal, MD;  Location: [facility];  Service: Orthopedics;  Laterality: Left;   TARSAL METATARSAL ARTHRODESIS Left 02/23/2018   Procedure: Left First and Second Tarsometatarsal Arthrodesis;  Surgeon: Toni Arthurs, MD;  Location: [facility];  Service: Orthopedics;  Laterality: Left;   thumb surgery     left   VAGINAL HYSTERECTOMY     Patient Active Problem List   Diagnosis Date Noted   History of pulmonary embolism 02/02/2023   Multiple subsegmental pulmonary emboli without acute cor pulmonale (HCC) 06/08/2022   Acute deep vein thrombosis (DVT) of femoral vein of right lower extremity (HCC) 06/08/2022    Acquired hypothyroidism 06/08/2022   Slow transit constipation 06/08/2022   Spinal stenosis of thoracolumbar region 06/08/2022   Primary hypertension 06/08/2022   Anxiety disorder 02/04/2020   Common cold 03/01/2019   Open wound of left lower leg 10/09/2018   Dyspnea 08/02/2018   Adjustment disorder with depressed mood 03/15/2017   Abnormal weight gain 03/09/2016   Encounter for general adult medical examination without abnormal findings 03/03/2016   Degeneration of lumbar intervertebral disc 11/28/2014   Asymptomatic microscopic hematuria 12/17/2013   Nontoxic single thyroid nodule 10/02/2013   S/P left UKR 11/15/2012   Expected blood loss anemia 11/15/2012   Overweight (BMI 25.0-29.9) 11/15/2012   Current use of long term anticoagulation 11/14/2009   Disorder of pericardium 11/07/2009   Gastroduodenitis 11/07/2009   Hyperlipidemia 11/07/2009    PCP: Mahlon Gammon, MD   REFERRING PROVIDER: Maisie Fus, PA-C   REFERRING DIAG:  Arthrodesis status  M46.1 (ICD-10-CM) - Sacroiliitis, not elsewhere classified    Rationale for Evaluation and Treatment: Rehabilitation  THERAPY DIAG:  Other low back pain  Muscle weakness (generalized)  Difficulty in walking, not elsewhere classified  ONSET DATE: early-mid 2022, surgery May 2023   SUBJECTIVE:  SUBJECTIVE STATEMENT: " Pain is about the same 5/10  PERTINENT HISTORY:  Bil partial TKA, h/o DVT   CLIFFORD DAUB, PA 05/05/23 ". . .no evidence of stress fracture or hardware loosening. She does have adjacent segment degenerative changes above her construct however this is not in the region of her pain. Her pain continues to be focused around the sacral base so Dr. Corbin Ade advised this is likely muscular in nature as she did have significant  paraspinal atrophy prior to her surgery and has likely persisted. Encouraged her to begin another trial of aquatic therapy and advised that this may be something she needs to do on an ongoing basis.   PAIN:  Are you having pain? Yes: NPRS scale: 5//10 Pain location: mid back Pain description: constant Aggravating factors: sitting and standing Relieving factors: lyingdown and water submersion  PRECAUTIONS: Fall   WEIGHT BEARING RESTRICTIONS: No  FALLS:  Has patient fallen in last 6 months? No  LIVING ENVIRONMENT: [facility] in: [facility] Has following equipment at home: Dan Humphreys - 2 wheeled and Wheelchair (power)  OCCUPATION: retired PT  PLOF: [place] [employer]  PATIENT GOALS: decrease pain   OBJECTIVE:   DIAGNOSTIC FINDINGS:  04/01/23 IMPRESSION: 1. Increased radiotracer uptake localizes to the endplates at T9-10 and T10-11. On the corresponding CT images there is marked degenerative disc disease at these levels. No underlying fracture identified. 2. Status post posterior hardware fixation of T11 through L4 with interbody fusion at L3-4. 3. Aortic Atherosclerosis (ICD10-I70.0). Coronary artery calcifications.  PATIENT SURVEYS:  FOTO risk adjusted 47% with goal of 48%   POSTURE: Anterior shift of pelvis with counter balance of shoulder posteriorly shifted   LOWER EXTREMITY MMT:    MMT(age norm goal)  Right eval Left eval  Hip flexion (21) 25.4 25.0  Hip extension    Hip abduction 22.8 17.0  Hip adduction    Hip internal rotation    Hip external rotation    Knee flexion    Knee extension (46)    Ankle dorsiflexion    Ankle plantarflexion    Ankle inversion    Ankle eversion     (Blank rows = not tested)    FUNCTIONAL TESTS:  5 times sit to stand: 16.26 from armed chair Timed up and go (TUG): 18.13 Feet together x 10 s  GAIT: Distance walked: 260ft Assistive device utilized: Environmental consultant - 2 wheeled Level of assistance:  Complete Independence Comments: good cadence, decreased step length, forward flexed positioning  TODAY'S TREATMENT:                                                                                                                              Pt seen for aquatic therapy today.  Treatment took place in water 3.25-4.5 ft in depth at the [facility] pool. Temp of water was 92.  Pt entered/exited the pool via stairs using step-to pattern, with supervision, with hand  rail.  She utilizes RW to/from pool.    * Holding black barbell:  walking forward, backward and side stepping x 4 ea *L stretch difficulty with form x 3 *Seated hamstring, gastroc and add stretches x 3 * Seated at bench with feet on blue step: cycling; LAQ; STS holding white barbell x 10;   - TrA set; 1/2 noodle pull down x 10 * Holding wall: single arm overhead reach - x 5 each *squatted position rest period *Holding wall; high knee marching;  heel raises (4 ft) x 15; hip abdct/addct x 10;  hip ext (small range) x 10 * return to walking forward / backward holding white barbell * holding wall: 1/2 diamonds x8; hip circles x 5;  *squatted position for recovery * wall push ups x 10 *Core engagement: single HB rainbow carry R/L  forward and backward x 1 width, yellow HB opposite hand for balance  Pt requires the buoyancy and hydrostatic pressure of water for support, and to offload joints by unweighting joint load by at least 50 % in navel deep water and by at least 75-80% in chest to neck deep water.  Viscosity of the water is needed for resistance of strengthening. Water current perturbations provides challenge to standing balance requiring increased core activation.   PATIENT EDUCATION:  Education details: Discussed eval findings, rehab rationale, aquatic program progression/POC and pools in area. Patient is in agreement  Person educated: Patient Education method: Explanation Education comprehension: verbalized  understanding  HOME EXERCISE PROGRAM: [email or web address] assigned 02/03/23  Access Code: [email or web address] URL: [email or web address] Date: 07/26/2023 Prepared by: Geni Bers  Exercises - Standing 'L' Stretch at [facility]  - 1 x daily - 7 x weekly - 3 sets - 10 reps - Squat  - 1 x daily - 7 x weekly - 3 sets - 10 reps - Walking  - 1 x daily - 7 x weekly - 3 sets - 10 reps - Standing Hip Abduction Adduction at Pool Wall  - 1 x daily - 7 x weekly - 3 sets - 10 reps - Standing Hip Flexion Extension at [employer]  - 1 x daily - 7 x weekly - 3 sets - 10 reps - Standing March at [facility]  - 1 x daily - 7 x weekly - 3 sets - 10 reps - Heel Toe Raises at Pool Wall  - 1 x daily - 7 x weekly - 3 sets - 10 reps - Standing Hip Circles at [employer]  - 1 x daily - 7 x weekly - 3 sets - 10 reps - Flutter Kicking/Windshield Wipers  - 1 x daily - 7 x weekly - 3 sets - 10 reps - Push-Up on Pool Wall  - 1 x daily - 7 x weekly - 3 sets - 10 reps - Forward Backward Tandem Walking  - 1 x daily - 7 x weekly - 3 sets - 10 reps NOT Issued ASSESSMENT:  CLINICAL IMPRESSION: Pt demonstrates safety and indep in setting with therapist instructing from deck. Reviewed program completed last episode and directed pt through. She does demonstrate a decreased toleration today having to scale back repetitions and allowing for additional rest periods.  At pts request trial amb without UE support but pt proves to be unsteady and required foam support. Initiated Scientific laboratory technician.   Initial clinical impression Patient is a 82 y.o. f who was seen today for physical therapy evaluation and treatment for chronic LBP. Pt is well known to this clinic as  she was DC in [place] of this year after 6 months of intervention.  She did not make it to her last appointment where she would have been issued her aquatic HEP.  [place].  Testing today as compared to last episdoe indicates she has maintained a  majority of her strength and mobility.  She continues to exercises at gym at [facility] 1-3 times weekly. Main limitation is chronic LBP.  She will benefit from a short episode of aquatic therapy to instruct on   OBJECTIVE IMPAIRMENTS: Abnormal gait, decreased activity tolerance, decreased strength, increased muscle spasms, impaired flexibility, impaired sensation, improper body mechanics, postural dysfunction, and pain   ACTIVITY LIMITATIONS: carrying, lifting, bending, sitting, standing, squatting, and locomotion level  PARTICIPATION LIMITATIONS: meal prep, cleaning, laundry, shopping, and community activity  PERSONAL FACTORS: Age, Past/current experiences, and Time since onset of injury/illness/exacerbation are also affecting patient's functional outcome.   REHAB POTENTIAL: Good  CLINICAL DECISION MAKING: Evolving/moderate complexity  EVALUATION COMPLEXITY: Moderate   GOALS: Goals reviewed with patient? Yes  SHORT TERM GOALS: Target date: 09/02/23  Pt will tolerate full aquatic sessions consistently without increase in pain and with improving function to demonstrate good toleration and effectiveness of intervention.   Baseline: Goal status: INITIAL  2.  Pt will be indep with final HEP's (land and aquatic as appropriate) for continued management of condition Baseline:  Goal status: INITIAL  3.  Pt will report decrease in LBP while submerged with residual relief last a few hours post session. Baseline: constant 6/10 Goal status: INITIAL   LONG TERM GOALS: not applicable  PLAN:  PT FREQUENCY: 1x/week  PT DURATION: 6 weeks 5 visits total  PLANNED INTERVENTIONS: Therapeutic exercises, Therapeutic activity, Neuromuscular re-education, Balance training, Gait training, Patient/Family education, Self Care, Joint mobilization, Stair training, Orthotic/Fit training, DME instructions, Aquatic Therapy, Dry Needling, Cryotherapy, Moist heat, Taping, Ionotophoresis 4mg /ml Dexamethasone,  Manual therapy, and Re-evaluation.  PLAN FOR NEXT SESSION: aquatics for instruction, Insurance risk surveyor of HEP for general strengthening and stretching LB   Nevin Grizzle (Frankie) Taran Ruark MPT 07/26/2023, 1:31 PM

## 2023-08-04 ENCOUNTER — Ambulatory Visit (HOSPITAL_BASED_OUTPATIENT_CLINIC_OR_DEPARTMENT_OTHER): Payer: Medicare Other | Admitting: Physical Therapy

## 2023-08-04 ENCOUNTER — Encounter (HOSPITAL_BASED_OUTPATIENT_CLINIC_OR_DEPARTMENT_OTHER): Payer: Self-pay

## 2023-08-10 ENCOUNTER — Ambulatory Visit (HOSPITAL_BASED_OUTPATIENT_CLINIC_OR_DEPARTMENT_OTHER): Payer: Medicare Other | Attending: Orthopedic Surgery | Admitting: Physical Therapy

## 2023-08-10 ENCOUNTER — Encounter (HOSPITAL_BASED_OUTPATIENT_CLINIC_OR_DEPARTMENT_OTHER): Payer: Self-pay | Admitting: Physical Therapy

## 2023-08-10 ENCOUNTER — Other Ambulatory Visit: Payer: Self-pay | Admitting: Cardiology

## 2023-08-10 DIAGNOSIS — R262 Difficulty in walking, not elsewhere classified: Secondary | ICD-10-CM | POA: Diagnosis not present

## 2023-08-10 DIAGNOSIS — M6281 Muscle weakness (generalized): Secondary | ICD-10-CM | POA: Insufficient documentation

## 2023-08-10 DIAGNOSIS — M5459 Other low back pain: Secondary | ICD-10-CM | POA: Insufficient documentation

## 2023-08-10 DIAGNOSIS — I10 Essential (primary) hypertension: Secondary | ICD-10-CM

## 2023-08-10 NOTE — Therapy (Signed)
OUTPATIENT PHYSICAL THERAPY THORACOLUMBAR TREATMENT   Patient Name: Stephanie Baker MRN: [id number] DOB:1941-11-07, 82 y.o., female Today's Date: 08/10/2023  END OF SESSION:  PT End of Session - 08/10/23 1119     Visit Number 3    Number of Visits 5    Date for PT Re-Evaluation 09/02/23    Authorization Type MCR    PT Start Time 1115    PT Stop Time 1155    PT Time Calculation (min) 40 min    Activity Tolerance Patient tolerated treatment well    Behavior During Therapy WFL for tasks assessed/performed             Past Medical History:  Diagnosis Date   Arthritis    OA   GERD (gastroesophageal reflux disease)    Hematuria    idiopathic- Dr. Mena Goes [facility]   Hyperlipemia    Hypertension    Hypothyroidism    Past Surgical History:  Procedure Laterality Date   ABDOMINAL HYSTERECTOMY     APPENDECTOMY     BACK SURGERY  2023   lumbar   EYE SURGERY     bilateral cataract extraction with IOL   GASTROC RECESSION EXTREMITY Left 02/23/2018   Procedure: Left Gastroc Recession;  Surgeon: Toni Arthurs, MD;  Location: [facility];  Service: Orthopedics;  Laterality: Left;   HAND SURGERY     left thumb MP   JOINT REPLACEMENT     right knee   PARTIAL KNEE ARTHROPLASTY  11/14/2012   Procedure: UNICOMPARTMENTAL KNEE;  Surgeon: Shelda Pal, MD;  Location: [facility];  Service: Orthopedics;  Laterality: Left;   TARSAL METATARSAL ARTHRODESIS Left 02/23/2018   Procedure: Left First and Second Tarsometatarsal Arthrodesis;  Surgeon: Toni Arthurs, MD;  Location: [facility];  Service: Orthopedics;  Laterality: Left;   thumb surgery     left   VAGINAL HYSTERECTOMY     Patient Active Problem List   Diagnosis Date Noted   History of pulmonary embolism 02/02/2023   Multiple subsegmental pulmonary emboli without acute cor pulmonale (HCC) 06/08/2022   Acute deep vein thrombosis (DVT) of femoral vein of right lower extremity (HCC) 06/08/2022   Acquired  hypothyroidism 06/08/2022   Slow transit constipation 06/08/2022   Spinal stenosis of thoracolumbar region 06/08/2022   Primary hypertension 06/08/2022   Anxiety disorder 02/04/2020   Common cold 03/01/2019   Open wound of left lower leg 10/09/2018   Dyspnea 08/02/2018   Adjustment disorder with depressed mood 03/15/2017   Abnormal weight gain 03/09/2016   Encounter for general adult medical examination without abnormal findings 03/03/2016   Degeneration of lumbar intervertebral disc 11/28/2014   Asymptomatic microscopic hematuria 12/17/2013   Nontoxic single thyroid nodule 10/02/2013   S/P left UKR 11/15/2012   Expected blood loss anemia 11/15/2012   Overweight (BMI 25.0-29.9) 11/15/2012   Current use of long term anticoagulation 11/14/2009   Disorder of pericardium 11/07/2009   Gastroduodenitis 11/07/2009   Hyperlipidemia 11/07/2009    PCP: Mahlon Gammon, MD   REFERRING PROVIDER: Maisie Fus, PA-C   REFERRING DIAG:  Arthrodesis status  M46.1 (ICD-10-CM) - Sacroiliitis, not elsewhere classified    Rationale for Evaluation and Treatment: Rehabilitation  THERAPY DIAG:  Other low back pain  Muscle weakness (generalized)  Difficulty in walking, not elsewhere classified  ONSET DATE: early-mid 2022, surgery May 2023   SUBJECTIVE:  SUBJECTIVE STATEMENT: Pt reports she has not been to the pool at [place], but plans to start going on Tue/Thurs.     PERTINENT HISTORY:  Bil partial TKA, h/o DVT   CLIFFORD DAUB, PA 05/05/23 ". . .no evidence of stress fracture or hardware loosening. She does have adjacent segment degenerative changes above her construct however this is not in the region of her pain. Her pain continues to be focused around the sacral base so Dr. Corbin Ade advised this is  likely muscular in nature as she did have significant paraspinal atrophy prior to her surgery and has likely persisted. Encouraged her to begin another trial of aquatic therapy and advised that this may be something she needs to do on an ongoing basis.   PAIN:  Are you having pain? Yes: NPRS scale: 4.5-5//10 Pain location: mid back Pain description: constant Aggravating factors: sitting and standing Relieving factors: lyingdown and water submersion  PRECAUTIONS: Fall   WEIGHT BEARING RESTRICTIONS: No  FALLS:  Has patient fallen in last 6 months? No  LIVING ENVIRONMENT: [facility] in: [facility] Has following equipment at home: Dan Humphreys - 2 wheeled and Wheelchair (power)  OCCUPATION: retired PT  PLOF[place] [employer]  PATIENT GOALS: decrease pain   OBJECTIVE:   DIAGNOSTIC FINDINGS:  04/01/23 IMPRESSION: 1. Increased radiotracer uptake localizes to the endplates at T9-10 and T10-11. On the corresponding CT images there is marked degenerative disc disease at these levels. No underlying fracture identified. 2. Status post posterior hardware fixation of T11 through L4 with interbody fusion at L3-4. 3. Aortic Atherosclerosis (ICD10-I70.0). Coronary artery calcifications.  PATIENT SURVEYS:  FOTO risk adjusted 47% with goal of 48%   POSTURE: Anterior shift of pelvis with counter balance of shoulder posteriorly shifted   LOWER EXTREMITY MMT:    MMT(age norm goal)  Right eval Left eval  Hip flexion (21) 25.4 25.0  Hip extension    Hip abduction 22.8 17.0  Hip adduction    Hip internal rotation    Hip external rotation    Knee flexion    Knee extension (46)    Ankle dorsiflexion    Ankle plantarflexion    Ankle inversion    Ankle eversion     (Blank rows = not tested)    FUNCTIONAL TESTS:  5 times sit to stand: 16.26 from armed chair Timed up and go (TUG): 18.13 Feet together x 10 s  GAIT: Distance walked: 240ft Assistive device  utilized: Environmental consultant - 2 wheeled Level of assistance: Complete Independence Comments: good cadence, decreased step length, forward flexed positioning  TODAY'S TREATMENT:                                                                                                                              Pt seen for aquatic therapy today.  Treatment took place in water 3.25-4.5 ft in depth at the [facility] pool. Temp of water was 92.  Pt  entered/exited the pool via stairs using step-to pattern, with supervision, with hand  rail.  She utilizes RW to/from pool.    * Holding black barbell:  walking forward, backward and side stepping R/L; marching forward  * Holding wall: 10 of each- heel / toe raises, hip abdct/ addct, mini squat, leg swings into hip flex / ext - 2 sets  * Marching without UE support * wall push up/off x 15; hip bumps x 10  * return to walking with reciprocal arm movement * tandem gait with UE on noodle * Seated at bench with feet on blue step: cycling; hip abdct/ addct  * Standing TrA set; 1/2 noodle pull down x 10 * Holding wall: single arm overhead reach - x 5 each  Pt requires the buoyancy and hydrostatic pressure of water for support, and to offload joints by unweighting joint load by at least 50 % in navel deep water and by at least 75-80% in chest to neck deep water.  Viscosity of the water is needed for resistance of strengthening. Water current perturbations provides challenge to standing balance requiring increased core activation.   PATIENT EDUCATION:  Education details: re-acquainting to aquatic therapy  Person educated: Patient Education method: Explanation Education comprehension: verbalized understanding  HOME EXERCISE PROGRAM: [email or web address] assigned 02/03/23  AQUATIC Access Code: [email or web address] URL: [email or web address] This aquatic home exercise program from [employer] utilizes pictures from land based exercises, but has been adapted prior to lamination  and issuance.   ASSESSMENT:  CLINICAL IMPRESSION: Pt tolerated aquatic exercises, with no change in pain level.  Requested 1 seated rest break during session.  She was able to ambulate in chest deep water without UE support today, with confidence.  Issued aquatic HEP.  Pt has 2 more scheduled appts - will assess response to HEP and modify as needed.    Initial clinical impression Patient is a 82 y.o. f who was seen today for physical therapy evaluation and treatment for chronic LBP. Pt is well known to this clinic as she was DC in feb of this year after 6 months of intervention.  She did not make it to her last appointment where she would have been issued her aquatic HEP.  [place]  Testing today as compared to last episdoe indicates she has maintained a majority of her strength and mobility.  She continues to exercises at gym at [facility] 1-3 times weekly. Main limitation is chronic LBP.  She will benefit from a short episode of aquatic therapy to instruction.    OBJECTIVE IMPAIRMENTS: Abnormal gait, decreased activity tolerance, decreased strength, increased muscle spasms, impaired flexibility, impaired sensation, improper body mechanics, postural dysfunction, and pain   ACTIVITY LIMITATIONS: carrying, lifting, bending, sitting, standing, squatting, and locomotion level  PARTICIPATION LIMITATIONS: meal prep, cleaning, laundry, shopping, and community activity  PERSONAL FACTORS: Age, Past/current experiences, and Time since onset of injury/illness/exacerbation are also affecting patient's functional outcome.   REHAB POTENTIAL: Good  CLINICAL DECISION MAKING: Evolving/moderate complexity  EVALUATION COMPLEXITY: Moderate   GOALS: Goals reviewed with patient? Yes  SHORT TERM GOALS: Target date: 09/02/23  Pt will tolerate full aquatic sessions consistently without increase in pain and with improving function to demonstrate good toleration and  effectiveness of intervention.   Baseline: Goal status: INITIAL  2.  Pt will be indep with final HEP's (land and aquatic as appropriate) for continued management of condition Baseline:  Goal status: INITIAL  3.  Pt will report decrease  in LBP while submerged with residual relief last a few hours post session. Baseline: constant 6/10 Goal status: INITIAL   LONG TERM GOALS: not applicable  PLAN:  PT FREQUENCY: 1x/week  PT DURATION: 6 weeks 5 visits total  PLANNED INTERVENTIONS: Therapeutic exercises, Therapeutic activity, Neuromuscular re-education, Balance training, Gait training, Patient/Family education, Self Care, Joint mobilization, Stair training, Orthotic/Fit training, DME instructions, Aquatic Therapy, Dry Needling, Cryotherapy, Moist heat, Taping, Ionotophoresis 4mg /ml Dexamethasone, Manual therapy, and Re-evaluation.  PLAN FOR NEXT SESSION: aquatics for instruction, creation and issuance of HEP for general strengthening and stretching LB   Mayer Camel, PTA 08/10/23 1:15 PM [facility] [address] [place], [place], [address] Phone: [phone]   Fax:  [phone]

## 2023-08-19 ENCOUNTER — Ambulatory Visit (HOSPITAL_BASED_OUTPATIENT_CLINIC_OR_DEPARTMENT_OTHER): Payer: Medicare Other | Admitting: Physical Therapy

## 2023-08-19 ENCOUNTER — Encounter (HOSPITAL_BASED_OUTPATIENT_CLINIC_OR_DEPARTMENT_OTHER): Payer: Self-pay | Admitting: Physical Therapy

## 2023-08-19 DIAGNOSIS — M5459 Other low back pain: Secondary | ICD-10-CM | POA: Diagnosis not present

## 2023-08-19 DIAGNOSIS — R262 Difficulty in walking, not elsewhere classified: Secondary | ICD-10-CM

## 2023-08-19 DIAGNOSIS — M6281 Muscle weakness (generalized): Secondary | ICD-10-CM | POA: Diagnosis not present

## 2023-08-19 NOTE — Therapy (Signed)
OUTPATIENT PHYSICAL THERAPY THORACOLUMBAR TREATMENT   Patient Name: Stephanie Baker MRN: [id number] DOB:01-24-1941, 82 y.o., female Today's Date: 08/19/2023  END OF SESSION:  PT End of Session - 08/19/23 1305     Visit Number 4    Number of Visits 5    Date for PT Re-Evaluation 09/02/23    Authorization Type MCR    PT Start Time 1300    PT Stop Time 1340    PT Time Calculation (min) 40 min    Behavior During Therapy WFL for tasks assessed/performed             Past Medical History:  Diagnosis Date   Arthritis    OA   GERD (gastroesophageal reflux disease)    Hematuria    idiopathic- Dr. Mena [facility]es [facility]   Hyperlipemia    Hypertension    Hypothyroidism    Past Surgical History:  Procedure Laterality Date   ABDOMINAL HYSTERECTOMY     APPENDECTOMY     BACK SURGERY  2023   lumbar   EYE SURGERY     bilateral cataract extraction with IOL   GASTROC RECESSION EXTREMITY Left 02/23/2018   Procedure: Left Gastroc Recession;  Surgeon: Toni Arthurs, MD;  Location: [facility];  Service: Orthopedics;  Laterality: Left;   HAND SURGERY     left thumb MP   JOINT REPLACEMENT     right knee   PARTIAL KNEE ARTHROPLASTY  11/14/2012   Procedure: UNICOMPARTMENTAL KNEE;  Surgeon: Shelda Pal, MD;  Location: [facility];  Service: Orthopedics;  Laterality: Left;   TARSAL METATARSAL ARTHRODESIS Left 02/23/2018   Procedure: Left First and Second Tarsometatarsal Arthrodesis;  Surgeon: Toni Arthurs, MD;  Location: [facility];  Service: Orthopedics;  Laterality: Left;   thumb surgery     left   VAGINAL HYSTERECTOMY     Patient Active Problem List   Diagnosis Date Noted   History of pulmonary embolism 02/02/2023   Multiple subsegmental pulmonary emboli without acute cor pulmonale (HCC) 06/08/2022   Acute deep vein thrombosis (DVT) of femoral vein of right lower extremity (HCC) 06/08/2022   Acquired hypothyroidism 06/08/2022   Slow transit constipation  06/08/2022   Spinal stenosis of thoracolumbar region 06/08/2022   Primary hypertension 06/08/2022   Anxiety disorder 02/04/2020   Common cold 03/01/2019   Open wound of left lower leg 10/09/2018   Dyspnea 08/02/2018   Adjustment disorder with depressed mood 03/15/2017   Abnormal weight gain 03/09/2016   Encounter for general adult medical examination without abnormal findings 03/03/2016   Degeneration of lumbar intervertebral disc 11/28/2014   Asymptomatic microscopic hematuria 12/17/2013   Nontoxic single thyroid nodule 10/02/2013   S/P left UKR 11/15/2012   Expected blood loss anemia 11/15/2012   Overweight (BMI 25.0-29.9) 11/15/2012   Current use of long term anticoagulation 11/14/2009   Disorder of pericardium 11/07/2009   Gastroduodenitis 11/07/2009   Hyperlipidemia 11/07/2009    PCP: Mahlon Gammon, MD   REFERRING PROVIDER: Maisie Fus, PA-C   REFERRING DIAG:  Arthrodesis status  M46.1 (ICD-10-CM) - Sacroiliitis, not elsewhere classified    Rationale for Evaluation and Treatment: Rehabilitation  THERAPY DIAG:  Other low back pain  Muscle weakness (generalized)  Difficulty in walking, not elsewhere classified  ONSET DATE: early-mid 2022, surgery May 2023   SUBJECTIVE:  SUBJECTIVE STATEMENT: Pt reports she has not been to the pool at [facility], "I stayed in the bed most of the week. My back hurt".  She had previously mentioned she planned to go to pool on Tue/Thurs.    "The pain is about the same".   PERTINENT HISTORY:  Bil partial TKA, h/o DVT   CLIFFORD DAUB, PA 05/05/23 ". . .no evidence of stress fracture or hardware loosening. She does have adjacent segment degenerative changes above her construct however this is not in the region of her pain. Her pain continues to be  focused around the sacral base so Dr. Corbin Ade advised this is likely muscular in nature as she did have significant paraspinal atrophy prior to her surgery and has likely persisted. Encouraged her to begin another trial of aquatic therapy and advised that this may be something she needs to do on an ongoing basis.   PAIN:  Are you having pain? Yes: NPRS scale: 5/10 Pain location: mid back Pain description: constant Aggravating factors: sitting and standing Relieving factors: lyingdown and water submersion  PRECAUTIONS: Fall   WEIGHT BEARING RESTRICTIONS: No  FALLS:  Has patient fallen in last 6 months? No  LIVING ENVIRONMENT: Lives in: [facility] Has following equipment at home: Dan Humphreys - 2 wheeled and Wheelchair (power)  OCCUPATION: retired PT  PLOF: [place] [employer]  PATIENT GOALS: decrease pain   OBJECTIVE:   DIAGNOSTIC FINDINGS:  04/01/23 IMPRESSION: 1. Increased radiotracer uptake localizes to the endplates at T9-10 and T10-11. On the corresponding CT images there is marked degenerative disc disease at these levels. No underlying fracture identified. 2. Status post posterior hardware fixation of T11 through L4 with interbody fusion at L3-4. 3. Aortic Atherosclerosis (ICD10-I70.0). Coronary artery calcifications.  PATIENT SURVEYS:  FOTO risk adjusted 47% with goal of 48%   POSTURE: Anterior shift of pelvis with counter balance of shoulder posteriorly shifted   LOWER EXTREMITY MMT:    MMT(age norm goal)  Right eval Left eval  Hip flexion (21) 25.4 25.0  Hip extension    Hip abduction 22.8 17.0  Hip adduction    Hip internal rotation    Hip external rotation    Knee flexion    Knee extension (46)    Ankle dorsiflexion    Ankle plantarflexion    Ankle inversion    Ankle eversion     (Blank rows = not tested)    FUNCTIONAL TESTS:  5 times sit to stand: 16.26 from armed chair Timed up and go (TUG): 18.13 Feet together  x 10 s  GAIT: Distance walked: 230ft Assistive device utilized: Environmental consultant - 2 wheeled Level of assistance: Complete Independence Comments: good cadence, decreased step length, forward flexed positioning  TODAY'S TREATMENT:                                                                                                                              Pt seen for  aquatic therapy today.  Treatment took place in water 3.25-4.5 ft in depth at the [facility] pool. Temp of water was 92.  Pt entered/exited the pool via stairs using step-to pattern, with supervision, with hand  rail.  She utilizes RW to/from pool.    * Holding rainbow hand floats:  walking forward, backward  * without support : side stepping R/L; marching forward  * Holding wall: 10 of each- heel / toe raises; hip abdct/ addct; mini squat; leg swings into hip flex / ext; wall push up/off  - 2 sets completed in a circuit * Marching without UE support * return to walking with reciprocal arm movement * tandem gait without UE support (challenge) * trial of decompression with yellow noodle under arms behind back/ cycling - limited tolerance and difficulty pulling LE back down when they floated up  * return to walking unsupported forward / backward  * Standing TrA set; 1/2 noodle pull down x 10, with stepping strategy to correct  (good challenge) * R/L hamstring stretch at the stairs 2 x 20s each LE  Pt requires the buoyancy and hydrostatic pressure of water for support, and to offload joints by unweighting joint load by at least 50 % in navel deep water and by at least 75-80% in chest to neck deep water.  Viscosity of the water is needed for resistance of strengthening. Water current perturbations provides challenge to standing balance requiring increased core activation.   PATIENT EDUCATION:  Education details: re-acquainting to aquatic therapy  Person educated: Patient Education method: Explanation Education comprehension:  verbalized understanding  HOME EXERCISE PROGRAM: [email or web address] assigned 02/03/23  AQUATIC Access Code: [email or web address] URL: [email or web address] This aquatic home exercise program from MedBridge utilizes pictures from land based exercises, but has been adapted prior to lamination and issuance.   ASSESSMENT:  CLINICAL IMPRESSION: Pt tolerated aquatic exercises, but with no change in pain level. Pain remained 5/10 during session.  No seated rest breaks during session despite offer.  She was able to ambulate in chest deep water without UE support.  Reviewed issued HEP.  Pt has 1 more scheduled appt - will assess response to HEP and modify as needed. Pt has met STG1. PT to assess readiness to d/c.    Initial clinical impression Patient is a 82 y.o. f who was seen today for physical therapy evaluation and treatment for chronic LBP. Pt is well known to this clinic as she was DC in feb of this year after 6 months of intervention.  She did not make it to her last appointment where she would have been issued her aquatic HEP.  [place]  Testing today as compared to last episdoe indicates she has maintained a majority of her strength and mobility.  She continues to exercises at gym at [facility] 1-3 times weekly. Main limitation is chronic LBP.  She will benefit from a short episode of aquatic therapy to instruction.    OBJECTIVE IMPAIRMENTS: Abnormal gait, decreased activity tolerance, decreased strength, increased muscle spasms, impaired flexibility, impaired sensation, improper body mechanics, postural dysfunction, and pain   ACTIVITY LIMITATIONS: carrying, lifting, bending, sitting, standing, squatting, and locomotion level  PARTICIPATION LIMITATIONS: meal prep, cleaning, laundry, shopping, and community activity  PERSONAL FACTORS: Age, Past/current experiences, and Time since onset of injury/illness/exacerbation are also affecting patient's functional outcome.    REHAB POTENTIAL: Good  CLINICAL DECISION MAKING: Evolving/moderate complexity  EVALUATION COMPLEXITY: Moderate   GOALS: Goals reviewed with patient?  Yes  SHORT TERM GOALS: Target date: 09/02/23  Pt will tolerate full aquatic sessions consistently without increase in pain and with improving function to demonstrate good toleration and effectiveness of intervention.   Baseline: Goal status: MET - 08/19/23  2.  Pt will be indep with final HEP's (land and aquatic as appropriate) for continued management of condition Baseline:  Goal status: INITIAL  3.  Pt will report decrease in LBP while submerged with residual relief last a few hours post session. Baseline: constant 6/10 Goal status: INITIAL   LONG TERM GOALS: not applicable  PLAN:  PT FREQUENCY: 1x/week  PT DURATION: 6 weeks 5 visits total  PLANNED INTERVENTIONS: Therapeutic exercises, Therapeutic activity, Neuromuscular re-education, Balance training, Gait training, Patient/Family education, Self Care, Joint mobilization, Stair training, Orthotic/Fit training, DME instructions, Aquatic Therapy, Dry Needling, Cryotherapy, Moist heat, Taping, Ionotophoresis 4mg /ml Dexamethasone, Manual therapy, and Re-evaluation.  PLAN FOR NEXT SESSION: aquatics for instruction, creation and issuance of HEP for general strengthening and stretching LB  Mayer Camel, PTA 08/19/23 4:47 PM [facility] [address] [place], [place], [address] Phone: [phone]   Fax:  [phone]

## 2023-08-24 ENCOUNTER — Ambulatory Visit (HOSPITAL_BASED_OUTPATIENT_CLINIC_OR_DEPARTMENT_OTHER): Payer: Medicare Other | Admitting: Physical Therapy

## 2023-08-25 ENCOUNTER — Encounter (HOSPITAL_BASED_OUTPATIENT_CLINIC_OR_DEPARTMENT_OTHER): Payer: Self-pay | Admitting: Physical Therapy

## 2023-08-25 ENCOUNTER — Ambulatory Visit (HOSPITAL_BASED_OUTPATIENT_CLINIC_OR_DEPARTMENT_OTHER): Payer: Medicare Other | Admitting: Physical Therapy

## 2023-08-25 DIAGNOSIS — M6281 Muscle weakness (generalized): Secondary | ICD-10-CM

## 2023-08-25 DIAGNOSIS — R262 Difficulty in walking, not elsewhere classified: Secondary | ICD-10-CM

## 2023-08-25 DIAGNOSIS — M5459 Other low back pain: Secondary | ICD-10-CM | POA: Diagnosis not present

## 2023-08-25 NOTE — Therapy (Signed)
OUTPATIENT PHYSICAL THERAPY THORACOLUMBAR TREATMENT/DC PHYSICAL THERAPY DISCHARGE SUMMARY  Visits from Start of Care: 5  Current functional level related to goals / functional outcomes: Pt is as safe and indep with all adl's and functional mobility as able using AD's   Remaining deficits: Chronic back pain   Education / Equipment: Management of condition/HEP   Patient agrees to discharge. Patient goals were met, 1 goal deferred. Patient is being discharged due to maximized rehab potential.     Patient Name: Stephanie Baker MRN: [id number] DOB:02-Jul-1941, 82 y.o., female Today's Date: 08/25/2023  END OF SESSION:  PT End of Session - 08/25/23 1120     Visit Number 5    Number of Visits 5    Date for PT Re-Evaluation 09/02/23    Authorization Type MCR    PT Start Time 1110    PT Stop Time 1155    PT Time Calculation (min) 45 min    Activity Tolerance Patient tolerated treatment well    Behavior During Therapy WFL for tasks assessed/performed              Past Medical History:  Diagnosis Date   Arthritis    OA   GERD (gastroesophageal reflux disease)    Hematuria    idiopathic- Dr. Mena Goes [facility]   Hyperlipemia    Hypertension    Hypothyroidism    Past Surgical History:  Procedure Laterality Date   ABDOMINAL HYSTERECTOMY     APPENDECTOMY     BACK SURGERY  2023   lumbar   EYE SURGERY     bilateral cataract extraction with IOL   GASTROC RECESSION EXTREMITY Left 02/23/2018   Procedure: Left Gastroc Recession;  Surgeon: Toni Arthurs, MD;  Location: [facility];  Service: Orthopedics;  Laterality: Left;   HAND SURGERY     left thumb MP   JOINT REPLACEMENT     right knee   PARTIAL KNEE ARTHROPLASTY  11/14/2012   Procedure: UNICOMPARTMENTAL KNEE;  Surgeon: Shelda Pal, MD;  Location: [facility];  Service: Orthopedics;  Laterality: Left;   TARSAL METATARSAL ARTHRODESIS Left 02/23/2018   Procedure: Left First and Second Tarsometatarsal  Arthrodesis;  Surgeon: Toni Arthurs, MD;  Location: [facility];  Service: Orthopedics;  Laterality: Left;   thumb surgery     left   VAGINAL HYSTERECTOMY     Patient Active Problem List   Diagnosis Date Noted   History of pulmonary embolism 02/02/2023   Multiple subsegmental pulmonary emboli without acute cor pulmonale (HCC) 06/08/2022   Acute deep vein thrombosis (DVT) of femoral vein of right lower extremity (HCC) 06/08/2022   Acquired hypothyroidism 06/08/2022   Slow transit constipation 06/08/2022   Spinal stenosis of thoracolumbar region 06/08/2022   Primary hypertension 06/08/2022   Anxiety disorder 02/04/2020   Common cold 03/01/2019   Open wound of left lower leg 10/09/2018   Dyspnea 08/02/2018   Adjustment disorder with depressed mood 03/15/2017   Abnormal weight gain 03/09/2016   Encounter for general adult medical examination without abnormal findings 03/03/2016   Degeneration of lumbar intervertebral disc 11/28/2014   Asymptomatic microscopic hematuria 12/17/2013   Nontoxic single thyroid nodule 10/02/2013   S/P left UKR 11/15/2012   Expected blood loss anemia 11/15/2012   Overweight (BMI 25.0-29.9) 11/15/2012   Current use of long term anticoagulation 11/14/2009   Disorder of pericardium 11/07/2009   Gastroduodenitis 11/07/2009   Hyperlipidemia 11/07/2009    PCP: Mahlon Gammon, MD   REFERRING PROVIDER: Maisie Fus,  PA-C   REFERRING DIAG:  Arthrodesis status  M46.1 (ICD-10-CM) - Sacroiliitis, not elsewhere classified    Rationale for Evaluation and Treatment: Rehabilitation  THERAPY DIAG:  Other low back pain  Muscle weakness (generalized)  Difficulty in walking, not elsewhere classified  ONSET DATE: early-mid 2022, surgery May 2023   SUBJECTIVE:                                                                                                                                                                                            SUBJECTIVE STATEMENT: Pt reports she has not been to the pool at [place], "I stayed in the bed most of the week. My back hurt".  She had previously mentioned she planned to go to pool on Tue/Thurs.    "The pain is about the same".   PERTINENT HISTORY:  Bil partial TKA, h/o DVT   CLIFFORD DAUB, PA 05/05/23 ". . .no evidence of stress fracture or hardware loosening. She does have adjacent segment degenerative changes above her construct however this is not in the region of her pain. Her pain continues to be focused around the sacral base so Dr. Corbin Ade advised this is likely muscular in nature as she did have significant paraspinal atrophy prior to her surgery and has likely persisted. Encouraged her to begin another trial of aquatic therapy and advised that this may be something she needs to do on an ongoing basis.   PAIN:  Are you having pain? Yes: NPRS scale: 5/10 Pain location: mid back Pain description: constant Aggravating factors: sitting and standing Relieving factors: lyingdown and water submersion  PRECAUTIONS: Fall   WEIGHT BEARING RESTRICTIONS: No  FALLS:  Has patient fallen in last 6 months? No  LIVING ENVIRONMENT: Lives in: [facility] Has following equipment at home: Dan Humphreys - 2 wheeled and Wheelchair (power)  OCCUPATION: retired PT  PLOF[place] [employer]  PATIENT GOALS: decrease pain   OBJECTIVE:   DIAGNOSTIC FINDINGS:  04/01/23 IMPRESSION: 1. Increased radiotracer uptake localizes to the endplates at T9-10 and T10-11. On the corresponding CT images there is marked degenerative disc disease at these levels. No underlying fracture identified. 2. Status post posterior hardware fixation of T11 through L4 with interbody fusion at L3-4. 3. Aortic Atherosclerosis (ICD10-I70.0). Coronary artery calcifications.  PATIENT SURVEYS:  FOTO risk adjusted 47% with goal of 48%   POSTURE: Anterior shift of pelvis with counter balance  of shoulder posteriorly shifted   LOWER EXTREMITY MMT:    MMT(age norm goal)  Right eval Left eval  Hip flexion (21) 25.4 25.0  Hip extension    Hip  abduction 22.8 17.0  Hip adduction    Hip internal rotation    Hip external rotation    Knee flexion    Knee extension (46)    Ankle dorsiflexion    Ankle plantarflexion    Ankle inversion    Ankle eversion     (Blank rows = not tested)    FUNCTIONAL TESTS:  5 times sit to stand: 16.26 from armed chair Timed up and go (TUG): 18.13 Feet together x 10 s  GAIT: Distance walked: 261ft Assistive device utilized: Environmental consultant - 2 wheeled Level of assistance: Complete Independence Comments: good cadence, decreased step length, forward flexed positioning  TODAY'S TREATMENT:                                                                                                                              Pt seen for aquatic therapy today.  Treatment took place in water 3.25-4.5 ft in depth at the [facility] pool. Temp of water was 92.  Pt entered/exited the pool via stairs using step-to pattern, with supervision, with hand  rail.  She utilizes RW to/from pool.   Exercises - walking forward/ backward    - Standing 'L' Stretch at [employer]   - Walking March   - Standing Hip Abduction   - Forward Backward Leg Swing - hold wall or noodle   - Standing Heel Raise   - Squat with hands on pool wall   - Wall Push Up and Off    - Tandem Walking   - Flutter Kicking/Windshield Wipers - Standing Hamstring Stretch with Step    Pt requires the buoyancy and hydrostatic pressure of water for support, and to offload joints by unweighting joint load by at least 50 % in navel deep water and by at least 75-80% in chest to neck deep water.  Viscosity of the water is needed for resistance of strengthening. Water current perturbations provides challenge to standing balance requiring increased core activation.   PATIENT EDUCATION:  Education details:  re-acquainting to aquatic therapy  Person educated: Patient Education method: Explanation Education comprehension: verbalized understanding  HOME EXERCISE PROGRAM: [email or web address] assigned 02/03/23  AQUATIC Access Code: [email or web address] URL: [email or web address] This aquatic home exercise program from [employer] utilizes pictures from land based exercises, but has been adapted prior to lamination and issuance.    Exercises - walking forward/ backward   - 1 x daily - 7 x weekly - Standing 'L' Stretch at [employer]  - 1 x daily - 7 x weekly - 2 reps - 10 seconds hold - Walking March  - 1 x daily - 7 x weekly - Standing Hip Abduction  - 1 x daily - 7 x weekly - 2 sets - 10 reps - Forward Backward Leg Swing - hold wall or noodle   - 1 x daily - 7 x weekly - 2 sets - 10 reps - Standing Heel Raise  - 1 x daily - 7  x weekly - 2 sets - 10 reps - Squat with hands on pool wall  - 1 x daily - 7 x weekly - 2 sets - 10 reps - Wall Push Up and Off   - 1 x daily - 7 x weekly - 1-2 sets - 5-10 reps - Tandem Walking  - 1 x daily - 7 x weekly - 2 reps - Flutter Kicking/Windshield Wipers  - 1 x daily - 7 x weekly - 2 sets - 10 reps - Standing Hamstring Stretch with Step  - 1 x daily - 7 x weekly - 1 sets - 2 reps - 20 seconds hold  ASSESSMENT:  CLINICAL IMPRESSION: Pt has maximized her rehab potential in therapy (aquatic and land).  Her pain continues with little improvement (6/10 decreased to 5/10) but no advancement either. She does say that she can't remember clearly if she has any relief afterwards. She requires vc and demonstration for completion of aquatic HEP.  Clarifications written on laminated program.  She does report completion of program at home pool.  DC    Initial clinical impression Patient is a 82 y.o. f who was seen today for physical therapy evaluation and treatment for chronic LBP. Pt is well known to this clinic as she was DC in feb of this year after 6 months of intervention.  She  did not make it to her last appointment where she would have been issued her aquatic HEP.  [place]  Testing today as compared to last episdoe indicates she has maintained a majority of her strength and mobility.  She continues to exercises at gym at [facility] 1-3 times weekly. Main limitation is chronic LBP.  She will benefit from a short episode of aquatic therapy to instruction.    OBJECTIVE IMPAIRMENTS: Abnormal gait, decreased activity tolerance, decreased strength, increased muscle spasms, impaired flexibility, impaired sensation, improper body mechanics, postural dysfunction, and pain   ACTIVITY LIMITATIONS: carrying, lifting, bending, sitting, standing, squatting, and locomotion level  PARTICIPATION LIMITATIONS: meal prep, cleaning, laundry, shopping, and community activity  PERSONAL FACTORS: Age, Past/current experiences, and Time since onset of injury/illness/exacerbation are also affecting patient's functional outcome.   REHAB POTENTIAL: Good  CLINICAL DECISION MAKING: Evolving/moderate complexity  EVALUATION COMPLEXITY: Moderate   GOALS: Goals reviewed with patient? Yes  SHORT TERM GOALS: Target date: 09/02/23  Pt will tolerate full aquatic sessions consistently without increase in pain and with improving function to demonstrate good toleration and effectiveness of intervention.   Baseline: Goal status: MET - 08/19/23  2.  Pt will be indep with final HEP's (land and aquatic as appropriate) for continued management of condition Baseline:  Goal status: Met 08/25/23  3.  Pt will report decrease in LBP while submerged with residual relief last a few hours post session. Baseline: constant 6/10 Goal status: deferred due to pt's ability to recall 08/25/23   LONG TERM GOALS: not applicable  PLAN:  PT FREQUENCY: 1x/week  PT DURATION: 6 weeks 5 visits total  PLANNED INTERVENTIONS: Therapeutic exercises, Therapeutic activity, Neuromuscular  re-education, Balance training, Gait training, Patient/Family education, Self Care, Joint mobilization, Stair training, Orthotic/Fit training, DME instructions, Aquatic Therapy, Dry Needling, Cryotherapy, Moist heat, Taping, Ionotophoresis 4mg /ml Dexamethasone, Manual therapy, and Re-evaluation.  PLAN FOR NEXT SESSION: aquatics for instruction, creation and issuance of HEP for general strengthening and stretching LB  Rushie Chestnut) [facility] MPT 08/25/23 11:22 AM [facility] [address] [place],  [place], [address] Phone: [phone]   Fax:  [phone]

## 2023-09-03 DIAGNOSIS — R300 Dysuria: Secondary | ICD-10-CM | POA: Diagnosis not present

## 2023-09-14 ENCOUNTER — Other Ambulatory Visit: Payer: Self-pay

## 2023-09-14 DIAGNOSIS — E039 Hypothyroidism, unspecified: Secondary | ICD-10-CM

## 2023-09-14 MED ORDER — LEVOTHYROXINE SODIUM 25 MCG PO TABS: 25.0000 ug | ORAL_TABLET | Freq: Every day | ORAL | Status: DC

## 2023-09-16 ENCOUNTER — Other Ambulatory Visit: Payer: Self-pay

## 2023-09-16 DIAGNOSIS — E039 Hypothyroidism, unspecified: Secondary | ICD-10-CM

## 2023-09-16 MED ORDER — LEVOTHYROXINE SODIUM 25 MCG PO TABS: 25.0000 ug | ORAL_TABLET | Freq: Every day | ORAL | 1 refills | Status: DC

## 2023-09-19 ENCOUNTER — Other Ambulatory Visit: Payer: Self-pay

## 2023-09-19 DIAGNOSIS — E039 Hypothyroidism, unspecified: Secondary | ICD-10-CM

## 2023-09-19 MED ORDER — LEVOTHYROXINE SODIUM 25 MCG PO TABS: 25.0000 ug | ORAL_TABLET | Freq: Every day | ORAL | 1 refills | Status: DC

## 2023-09-29 DIAGNOSIS — Z23 Encounter for immunization: Secondary | ICD-10-CM | POA: Diagnosis not present

## 2023-10-13 DIAGNOSIS — M419 Scoliosis, unspecified: Secondary | ICD-10-CM | POA: Diagnosis not present

## 2023-10-13 DIAGNOSIS — Z981 Arthrodesis status: Secondary | ICD-10-CM | POA: Diagnosis not present

## 2023-10-17 ENCOUNTER — Encounter: Payer: Medicare Other | Admitting: Adult Health

## 2023-10-25 ENCOUNTER — Encounter: Payer: Medicare Other | Admitting: Internal Medicine

## 2023-10-25 DIAGNOSIS — R300 Dysuria: Secondary | ICD-10-CM | POA: Diagnosis not present

## 2023-10-25 DIAGNOSIS — Z01411 Encounter for gynecological examination (general) (routine) with abnormal findings: Secondary | ICD-10-CM | POA: Diagnosis not present

## 2023-10-26 DIAGNOSIS — R300 Dysuria: Secondary | ICD-10-CM | POA: Diagnosis not present

## 2023-11-01 ENCOUNTER — Non-Acute Institutional Stay: Payer: Medicare Other | Admitting: Internal Medicine

## 2023-11-01 ENCOUNTER — Encounter: Payer: Self-pay | Admitting: Internal Medicine

## 2023-11-01 VITALS — BP 122/70 | HR 82 | Temp 97.8°F | Resp 18 | Ht 64.0 in | Wt 135.0 lb

## 2023-11-01 DIAGNOSIS — B3731 Acute candidiasis of vulva and vagina: Secondary | ICD-10-CM | POA: Diagnosis not present

## 2023-11-01 DIAGNOSIS — E785 Hyperlipidemia, unspecified: Secondary | ICD-10-CM | POA: Diagnosis not present

## 2023-11-01 DIAGNOSIS — R0609 Other forms of dyspnea: Secondary | ICD-10-CM | POA: Diagnosis not present

## 2023-11-01 DIAGNOSIS — E039 Hypothyroidism, unspecified: Secondary | ICD-10-CM | POA: Diagnosis not present

## 2023-11-01 DIAGNOSIS — I10 Essential (primary) hypertension: Secondary | ICD-10-CM

## 2023-11-01 DIAGNOSIS — R350 Frequency of micturition: Secondary | ICD-10-CM | POA: Diagnosis not present

## 2023-11-01 DIAGNOSIS — R300 Dysuria: Secondary | ICD-10-CM

## 2023-11-01 MED ORDER — NYSTATIN 100000 UNIT/GM EX OINT: 1.0000 | TOPICAL_OINTMENT | Freq: Two times a day (BID) | CUTANEOUS | 0 refills | Status: DC

## 2023-11-01 NOTE — Progress Notes (Unsigned)
Location:  [facility]   Place of Service:  Clinic [phone])  Provider:   Code Status:  Goals of Care:     11/01/2023   10:42 AM  Advanced Directives  Does Patient Have a Medical Advance Directive? Yes  Type of Estate agent of [place];Living will;Out of facility DNR (pink MOST or yellow form)  Does patient want to make changes to medical advance directive? No - Patient declined  Copy of Healthcare Power of Attorney in Chart? Yes - validated most recent copy scanned in chart (See row information)     Chief Complaint  Patient presents with  . Medical Management of Chronic Issues    Patient states she is here for 3 month follow up and possible UTI    HPI: Patient is a 82 y.o. female seen today for medical management of chronic diseases.   Lives in [place]    Past Medical History:  Diagnosis Date  . Arthritis    OA  . GERD (gastroesophageal reflux disease)   . Hematuria    idiopathic- Dr. Mena [facility]es [facility]  . Hyperlipemia   . Hypertension   . Hypothyroidism     Past Surgical History:  Procedure Laterality Date  . ABDOMINAL HYSTERECTOMY    . APPENDECTOMY    . BACK SURGERY  2023   lumbar  . EYE SURGERY     bilateral cataract extraction with IOL  . GASTROC RECESSION EXTREMITY Left 02/23/2018   Procedure: Left Gastroc Recession;  Surgeon: Toni Arthurs, MD;  Location: [facility];  Service: Orthopedics;  Laterality: Left;  . HAND SURGERY     left thumb MP  . JOINT REPLACEMENT     right knee  . PARTIAL KNEE ARTHROPLASTY  11/14/2012   Procedure: UNICOMPARTMENTAL KNEE;  Surgeon: Shelda Pal, MD;  Location: [facility];  Service: Orthopedics;  Laterality: Left;  . TARSAL METATARSAL ARTHRODESIS Left 02/23/2018   Procedure: Left First and Second Tarsometatarsal Arthrodesis;  Surgeon: Toni Arthurs, MD;  Location: [facility];  Service: Orthopedics;  Laterality: Left;  . thumb surgery     left  . VAGINAL  HYSTERECTOMY      Allergies  Allergen Reactions  . Statins     Outpatient Encounter Medications as of 11/01/2023  Medication Sig  . amLODipine (NORVASC) 5 MG tablet Take 1 tablet (5 mg total) by mouth daily.  . Bempedoic Acid-Ezetimibe (NEXLIZET) 180-10 MG TABS TAKE 1 TABLET BY MOUTH EVERY DAY AT 12 NOON.  . Biotin 5000 MCG CAPS Take 1 capsule by mouth daily.  . Calcium Citrate-Vitamin D (CALCIUM CITRATE + D) 315-5 MG-MCG TABS Take 1 tablet by mouth in the morning and at bedtime.  . Evening Primrose Oil CAPS Take 1 capsule by mouth daily at 12 noon.  Marland Kitchen ketoconazole (NIZORAL) 2 % shampoo Apply 1 Application topically 2 (two) times a week.  . levothyroxine (SYNTHROID) 25 MCG tablet Take 1 tablet (25 mcg total) by mouth daily.  . Magnesium Citrate 100 MG TABS Take 150 mg by mouth every morning.  . Multiple Vitamins-Minerals (MULTIVITAMIN WITH MINERALS) tablet Take 1 tablet by mouth daily.  Marland Kitchen olmesartan-hydrochlorothiazide (BENICAR HCT) 40-12.5 MG tablet Take 1 tablet by mouth daily.  Marland Kitchen omega-3 acid ethyl esters (LOVAZA) 1 g capsule Take 2 g by mouth 2 (two) times daily.  . potassium chloride SA (KLOR-CON M) 20 MEQ tablet Take 1 tablet (20 mEq total) by mouth daily before breakfast. (Patient taking differently: Take 20  mEq by mouth daily after breakfast.)  . RESTASIS 0.05 % ophthalmic emulsion Place 1 drop into both eyes 2 (two) times daily.  . Turmeric 500 MG CAPS Take 500 mg by mouth daily.  . clotrimazole (CLOTRIMAZOLE ANTI-FUNGAL) 1 % cream Apply 1 Application topically 2 (two) times daily. (Patient not taking: Reported on 11/01/2023)  . [DISCONTINUED] nitroGLYCERIN (NITROSTAT) 0.4 MG SL tablet Place 1 tablet (0.4 mg total) under the tongue every 5 (five) minutes as needed for chest pain. If you require more than two tablets five minutes apart go to the nearest ER via EMS.   No facility-administered encounter medications on file as of 11/01/2023.    Review of Systems:  Review of  Systems  Health Maintenance  Topic Date Due  . INFLUENZA VACCINE  07/28/2023  . COVID-19 Vaccine (7 - 2023-24 season) 08/28/2023  . Medicare Annual Wellness (AWV)  04/25/2024  . Pneumonia Vaccine 75+ Years old  Completed  . DEXA SCAN  Completed  . Zoster Vaccines- Shingrix  Completed  . HPV VACCINES  Aged Out  . DTaP/Tdap/Td  Discontinued    Physical Exam: Vitals:   11/01/23 1040  BP: 122/70  Pulse: 82  Resp: 18  Temp: 97.8 F (36.6 C)  TempSrc: Temporal  SpO2: 97%  Weight: 135 lb (61.2 kg)  Height: 5\' 4"  (1.626 m)   Body mass index is 23.17 kg/m. Physical Exam  Labs reviewed: Basic Metabolic Panel: Recent Labs    02/04/23 1022 03/14/23 0834 06/09/23 1257 07/07/23 0000  NA 140 142 140 141  K 3.8 3.9 4.6 4.6  CL 104 105 105 104  CO2 23 21 21 20   GLUCOSE 100* 91 91  --   BUN 26 27 30* 35*  CREATININE 0.55* 0.82 0.82 0.8  CALCIUM 9.4 9.7 9.9 9.7  MG 2.0  --   --   --   TSH  --   --   --  2.76   Liver Function Tests: Recent Labs    03/14/23 0834 07/07/23 0000  AST 23 31  ALT 14 11  ALKPHOS 57 52  BILITOT 0.6  --   PROT 6.7  --   ALBUMIN 4.3 4.2   No results for input(s): "LIPASE", "AMYLASE" in the last 8760 hours. No results for input(s): "AMMONIA" in the last 8760 hours. CBC: Recent Labs    07/07/23 0000  WBC 6.2  HGB 12.9  HCT 39  PLT 370   Lipid Panel: Recent Labs    03/14/23 0834  CHOL 141  HDL 76  LDLCALC 53  TRIG 56  LDLDIRECT 58   No results found for: "HGBA1C"  Procedures since last visit: No results found.  Assessment/Plan There are no diagnoses linked to this encounter.   Labs/tests ordered:  * No order type specified * Next appt:  04/27/2024

## 2023-11-03 DIAGNOSIS — R300 Dysuria: Secondary | ICD-10-CM | POA: Diagnosis not present

## 2023-11-16 ENCOUNTER — Other Ambulatory Visit: Payer: Self-pay | Admitting: Internal Medicine

## 2023-11-16 DIAGNOSIS — I10 Essential (primary) hypertension: Secondary | ICD-10-CM

## 2023-12-06 ENCOUNTER — Encounter: Payer: Self-pay | Admitting: Internal Medicine

## 2023-12-06 ENCOUNTER — Non-Acute Institutional Stay: Payer: Medicare Other | Admitting: Internal Medicine

## 2023-12-06 VITALS — BP 112/64 | HR 74 | Temp 97.7°F | Resp 17 | Ht 64.0 in | Wt 135.0 lb

## 2023-12-06 DIAGNOSIS — R0609 Other forms of dyspnea: Secondary | ICD-10-CM | POA: Diagnosis not present

## 2023-12-06 DIAGNOSIS — R040 Epistaxis: Secondary | ICD-10-CM | POA: Diagnosis not present

## 2023-12-06 DIAGNOSIS — N952 Postmenopausal atrophic vaginitis: Secondary | ICD-10-CM

## 2023-12-06 DIAGNOSIS — R300 Dysuria: Secondary | ICD-10-CM | POA: Diagnosis not present

## 2023-12-06 DIAGNOSIS — I10 Essential (primary) hypertension: Secondary | ICD-10-CM | POA: Diagnosis not present

## 2023-12-06 DIAGNOSIS — B3731 Acute candidiasis of vulva and vagina: Secondary | ICD-10-CM | POA: Diagnosis not present

## 2023-12-06 DIAGNOSIS — E039 Hypothyroidism, unspecified: Secondary | ICD-10-CM | POA: Diagnosis not present

## 2023-12-06 MED ORDER — NYSTATIN 100000 UNIT/GM EX OINT: 1.0000 | TOPICAL_OINTMENT | Freq: Two times a day (BID) | CUTANEOUS | 4 refills | Status: DC

## 2023-12-06 MED ORDER — ESTRADIOL 0.1 MG/GM VA CREA: 1.0000 | TOPICAL_CREAM | VAGINAL | 12 refills | Status: DC

## 2023-12-06 MED ORDER — OXYMETAZOLINE HCL 0.05 % NA SOLN: 1.0000 | Freq: Two times a day (BID) | NASAL | 0 refills | Status: DC | PRN

## 2023-12-06 NOTE — Progress Notes (Signed)
Location:  [facility] of Service:  Clinic (12)  Provider:   Code Status:  Goals of Care:     12/06/2023    9:45 AM  Advanced Directives  Does Patient Have a Medical Advance Directive? Yes  Type of Estate agent of [place];Living will;Out of facility DNR (pink MOST or yellow form)  Does patient want to make changes to medical advance directive? No - Patient declined  Copy of Healthcare Power of Attorney in Chart? Yes - validated most recent copy scanned in chart (See row information)     Chief Complaint  Patient presents with   Medical Management of Chronic Issues    Patient is being seen for a 1 month follow up. Patient states she has been having a burning sensation.     HPI: Patient is a 82 y.o. female seen today for medical management of chronic diseases.   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 It was discussed her pain was likely muscular in nature due to the significant atrophy previously seen on MRI so she was recommended to give aquatic therapy '' Which Did not help either She is not taking anything like tylenol Walks with her walker and uses Power chair outside Is off Eliquis now   Chest pain With SOB Sees Cardiology Echo normal Lexiscan Low risk study Statins  and BP control   Nose Bleed New issue noticed few days ago had some bleed this morning also   Past Medical History:  Diagnosis Date   Arthritis    OA   GERD (gastroesophageal reflux disease)    Hematuria    idiopathic- Dr. Mena Goes [facility]rology   Hyperlipemia    Hypertension    Hypothyroidism     Past Surgical History:  Procedure Laterality Date   ABDOMINAL HYSTERECTOMY     APPENDECTOMY     BACK SURGERY  2023   lumbar   EYE SURGERY     bilateral cataract extraction with IOL   GASTROC RECESSION EXTREMITY Left 02/23/2018   Procedure: Left Gastroc Recession;  Surgeon: Toni Arthurs, MD;  Location: [facility];  Service: Orthopedics;  Laterality: Left;   HAND SURGERY     left thumb MP   JOINT REPLACEMENT     right knee   PARTIAL KNEE ARTHROPLASTY  11/14/2012   Procedure: UNICOMPARTMENTAL KNEE;  Surgeon: Shelda Pal, MD;  Location: [facility];  Service: Orthopedics;  Laterality: Left;   TARSAL METATARSAL ARTHRODESIS Left 02/23/2018   Procedure: Left First and Second Tarsometatarsal Arthrodesis;  Surgeon: Toni Arthurs, MD;  Location: [facility];  Service: Orthopedics;  Laterality: Left;   thumb surgery     left   VAGINAL HYSTERECTOMY      Allergies  Allergen  Reactions   Statins     Outpatient Encounter Medications as of 12/06/2023  Medication Sig   amLODipine (NORVASC) 5 MG tablet Take 1 tablet (5 mg total) by mouth daily.   Bempedoic Acid-Ezetimibe (NEXLIZET) 180-10 MG TABS TAKE 1 TABLET BY MOUTH EVERY DAY AT 12 NOON.   Biotin 5000 MCG CAPS Take 1 capsule by mouth daily.   Calcium Citrate-Vitamin D (CALCIUM CITRATE + D) 315-5 MG-MCG TABS Take 1 tablet by mouth in the morning and at bedtime.   Evening Primrose Oil CAPS Take 1 capsule by mouth daily at 12 noon.   ketoconazole (NIZORAL) 2 % shampoo Apply 1 Application topically 2 (two) times a week.   levothyroxine (SYNTHROID) 25 MCG tablet Take 1 tablet (25 mcg total) by mouth daily.   Magnesium  Citrate 100 MG TABS Take 150 mg by mouth every morning.   Multiple Vitamins-Minerals (MULTIVITAMIN WITH MINERALS) tablet Take 1 tablet by mouth daily.   nystatin ointment (MYCOSTATIN) Apply 1 Application topically 2 (two) times daily. Apply outside your vaginal area   olmesartan-hydrochlorothiazide (BENICAR HCT) 40-12.5 MG tablet Take 1 tablet by mouth daily.   omega-3 acid ethyl esters (LOVAZA) 1 g capsule Take 2 g by mouth 2 (two) times daily.   potassium chloride SA (KLOR-CON M) 20 MEQ tablet TAKE 1 TABLET BY MOUTH EVERY DAY   RESTASIS 0.05 % ophthalmic emulsion Place 1 drop into both eyes 2 (two) times daily.   Turmeric 500 MG CAPS Take 500 mg by mouth daily.   No facility-administered encounter medications on file as of 12/06/2023.    Review of Systems:  Review of Systems  Constitutional:  Negative for activity change and appetite change.  HENT: Negative.    Respiratory:  Negative for cough and shortness of breath.   Cardiovascular:  Negative for leg swelling.  Gastrointestinal:  Negative for constipation.  Genitourinary:  Positive for vaginal pain.  Musculoskeletal:  Positive for back pain and gait problem. Negative for arthralgias and myalgias.  Skin: Negative.   Neurological:  Negative for dizziness and weakness.  Psychiatric/Behavioral:  Negative for confusion, dysphoric mood and sleep disturbance.     Health Maintenance  Topic Date Due   COVID-19 Vaccine (8 - 2023-24 season) 12/13/2023   Medicare Annual Wellness (AWV)  04/25/2024   Pneumonia Vaccine 61+ Years old  Completed   INFLUENZA VACCINE  Completed   DEXA SCAN  Completed   Zoster Vaccines- Shingrix  Completed   HPV VACCINES  Aged Out   DTaP/Tdap/Td  Discontinued    Physical Exam: Vitals:   12/06/23 0943  BP: 112/64  Pulse: 74  Resp: 17  Temp: 97.7 F (36.5 C)  TempSrc: Temporal  SpO2: 95%  Weight: 135 lb (61.2 kg)  Height: 5\' 4"  (1.626 m)   Body mass index is 23.17 kg/m. Physical Exam Vitals  reviewed.  Constitutional:      Appearance: Normal appearance.  HENT:     Head: Normocephalic.     Right Ear: Tympanic membrane normal.     Left Ear: Tympanic membrane normal.     Nose: Congestion present.     Comments: Small ulcer in Right side of nose which could be the source of bleed    Mouth/Throat:     Mouth: Mucous membranes are moist.     Pharynx: Oropharynx is clear.  Eyes:     Pupils: Pupils are equal, round, and reactive to light.  Cardiovascular:     Rate and Rhythm: Normal rate and regular rhythm.  Pulses: Normal pulses.     Heart sounds: Normal heart sounds. No murmur heard. Pulmonary:     Effort: Pulmonary effort is normal.     Breath sounds: Normal breath sounds.  Abdominal:     General: Abdomen is flat. Bowel sounds are normal.     Palpations: Abdomen is soft.  Genitourinary:    Comments: Vaginal exam was normal some changes of Atrophy Musculoskeletal:        General: No swelling.     Cervical back: Neck supple.  Skin:    General: Skin is warm.  Neurological:     General: No focal deficit present.     Mental Status: She is alert and oriented to person, place, and time.  Psychiatric:        Mood and Affect: Mood normal.        Thought Content: Thought content normal.     Labs reviewed: Basic Metabolic Panel: Recent Labs    02/04/23 1022 03/14/23 0834 06/09/23 1257 07/07/23 0000  NA 140 142 140 141  K 3.8 3.9 4.6 4.6  CL 104 105 105 104  CO2 23 21 21 20   GLUCOSE 100* 91 91  --   BUN 26 27 30* 35*  CREATININE 0.55* 0.82 0.82 0.8  CALCIUM 9.4 9.7 9.9 9.7  MG 2.0  --   --   --   TSH  --   --   --  2.76   Liver Function Tests: Recent Labs    03/14/23 0834 07/07/23 0000  AST 23 31  ALT 14 11  ALKPHOS 57 52  BILITOT 0.6  --   PROT 6.7  --   ALBUMIN 4.3 4.2   No results for input(s): "LIPASE", "AMYLASE" in the last 8760 hours. No results for input(s): "AMMONIA" in the last 8760 hours. CBC: Recent Labs    07/07/23 0000  WBC 6.2   HGB 12.9  HCT 39  PLT 370   Lipid Panel: Recent Labs    03/14/23 0834  CHOL 141  HDL 76  LDLCALC 53  TRIG 56  LDLDIRECT 58   No results found for: "HGBA1C"  Procedures since last visit: No results found.  Assessment/Plan 1. Bleeding from the nose Can Try Afrin Spray PRN for Bleeding Also using Nasal spry for Moisture  2. Dysuria Urine and Work up negative Using Nystatin is helping  3. Vaginal candidiasis Nystatin  4. Vaginal atrophy Will start on Estrace cream to see if ti helps  5. Acquired hypothyroidism Needs repeat Labs  TSH normal  6. Dyspnea on exertion Work up Negative per Cardiology They recommended Pulmonary if Symptoms continue ar she has SOB She is refusing right now  7. Primary hypertension Norvasc and Benicar 8 HLD On NExilizet    Labs/tests ordered:  CBC,CMP,Lipid,TSH before 4/15 visit Next appt:  04/27/2024

## 2023-12-06 NOTE — Patient Instructions (Addendum)
Use Estrogen cream at night 2 /week Can use Nystatin cream as needed in the morning I am calling in afrin spray for nasal Bleeding Need RSV vaccine

## 2023-12-23 ENCOUNTER — Telehealth: Payer: Self-pay

## 2023-12-23 ENCOUNTER — Other Ambulatory Visit (HOSPITAL_COMMUNITY): Payer: Self-pay

## 2023-12-23 DIAGNOSIS — E782 Mixed hyperlipidemia: Secondary | ICD-10-CM

## 2023-12-23 NOTE — Telephone Encounter (Signed)
Pharmacy Patient Advocate Encounter   Received notification from CoverMyMeds that prior authorization for NEXLIZET is required/requested.   Insurance verification completed.   The patient is insured through [facility] .   Per test claim: PA required; PA submitted to above mentioned insurance via CoverMyMeds Key/confirmation #/EOC [id number] Status is pending

## 2023-12-23 NOTE — Telephone Encounter (Signed)
Spoke with pt regarding Stephanie Baker, [employer] message about needing repeat lipids in order to send off PA forms. Explained to pt the need for having repeat labs and pt verbalized understanding. Orders for a lipid panel and direct LDL have been placed and released. Pt is aware. Stephanie Baker has been made aware as well for her records.

## 2023-12-23 NOTE — Telephone Encounter (Signed)
Prior auth for Nexlizet renewal needs LDL labs within the last 120 days. I think if we submit what we have, the PA will get denied. Can we have patient come in for lipids?

## 2023-12-29 ENCOUNTER — Telehealth: Payer: Self-pay | Admitting: Cardiology

## 2023-12-29 NOTE — Telephone Encounter (Signed)
 Spoke with pt. She stated she went and had her blood work done this morning. Told pt we would call her with the results once they populate and Dr. Odis Hollingshead goes over them. Pt verbalized understanding.

## 2023-12-29 NOTE — Telephone Encounter (Signed)
 Patient is requesting to speak with Dr. Emelda Brothers nurse. She states she was advised to by [facility] to have 2 cholesterol panels drawn. She would like to inform the nurse that she had the lab work today.

## 2023-12-30 LAB — CMP14+EGFR
ALT: 10 [IU]/L (ref 0–32)
AST: 20 [IU]/L (ref 0–40)
Albumin: 4.1 g/dL (ref 3.7–4.7)
Alkaline Phosphatase: 53 [IU]/L (ref 44–121)
BUN/Creatinine Ratio: 40 — ABNORMAL HIGH (ref 12–28)
BUN: 37 mg/dL — ABNORMAL HIGH (ref 8–27)
Bilirubin Total: 0.4 mg/dL (ref 0.0–1.2)
CO2: 20 mmol/L (ref 20–29)
Calcium: 9.6 mg/dL (ref 8.7–10.3)
Chloride: 105 mmol/L (ref 96–106)
Creatinine, Ser: 0.93 mg/dL (ref 0.57–1.00)
Globulin, Total: 2.4 g/dL (ref 1.5–4.5)
Glucose: 82 mg/dL (ref 70–99)
Potassium: 4.2 mmol/L (ref 3.5–5.2)
Sodium: 141 mmol/L (ref 134–144)
Total Protein: 6.5 g/dL (ref 6.0–8.5)
eGFR: 61 mL/min/{1.73_m2} (ref >59)

## 2023-12-30 LAB — LIPID PANEL WITH LDL/HDL RATIO
Cholesterol, Total: 138 mg/dL (ref 100–199)
HDL: 59 mg/dL (ref >39)
LDL Chol Calc (NIH): 62 mg/dL (ref 0–99)
LDL/HDL Ratio: 1.1 {ratio} (ref 0.0–3.2)
Triglycerides: 90 mg/dL (ref 0–149)
VLDL Cholesterol Cal: 17 mg/dL (ref 5–40)

## 2023-12-30 LAB — LDL CHOLESTEROL, DIRECT: LDL Direct: 60 mg/dL (ref 0–99)

## 2024-01-03 ENCOUNTER — Telehealth: Payer: Self-pay | Admitting: Pharmacy Technician

## 2024-01-03 ENCOUNTER — Other Ambulatory Visit (HOSPITAL_COMMUNITY): Payer: Self-pay

## 2024-01-03 NOTE — Telephone Encounter (Signed)
Spoke with pt and she verbalized understanding. No further questions at this time.

## 2024-01-03 NOTE — Telephone Encounter (Signed)
 Pharmacy Patient Advocate Encounter  Received notification from [facility] that Prior Authorization for nexlizet has been APPROVED from 12/28/23 to 12/26/24   PA #/Case ID/Reference #: [id number]

## 2024-01-21 ENCOUNTER — Other Ambulatory Visit: Payer: Self-pay | Admitting: Cardiology

## 2024-01-21 DIAGNOSIS — Z789 Other specified health status: Secondary | ICD-10-CM

## 2024-01-21 DIAGNOSIS — I700 Atherosclerosis of aorta: Secondary | ICD-10-CM

## 2024-01-21 DIAGNOSIS — I2510 Atherosclerotic heart disease of native coronary artery without angina pectoris: Secondary | ICD-10-CM

## 2024-01-23 ENCOUNTER — Telehealth: Payer: Self-pay | Admitting: Cardiology

## 2024-01-23 DIAGNOSIS — I700 Atherosclerosis of aorta: Secondary | ICD-10-CM

## 2024-01-23 DIAGNOSIS — Z789 Other specified health status: Secondary | ICD-10-CM

## 2024-01-23 DIAGNOSIS — I2510 Atherosclerotic heart disease of native coronary artery without angina pectoris: Secondary | ICD-10-CM

## 2024-01-23 MED ORDER — NEXLIZET 180-10 MG PO TABS: ORAL_TABLET | ORAL | 4 refills | Status: DC

## 2024-01-23 NOTE — Telephone Encounter (Signed)
Pt's medication was sent to pt's pharmacy as requested. Confirmation received.

## 2024-01-23 NOTE — Telephone Encounter (Signed)
*  STAT* If patient is at the pharmacy, call can be transferred to refill team.   1. Which medications need to be refilled? (please list name of each medication and dose if known)   2. Which pharmacy/location (including street and city if local pharmacy) is medication to be sent to? [id number] - [place], [place] - [address] AT [facility] [address] [facility] [phone]   3. Do they need a 30 day or 90 day supply? Bempedoic Acid-Ezetimibe (NEXLIZET) 180-10 MG TABS 30

## 2024-03-14 ENCOUNTER — Other Ambulatory Visit: Payer: Self-pay | Admitting: Internal Medicine

## 2024-03-14 DIAGNOSIS — E039 Hypothyroidism, unspecified: Secondary | ICD-10-CM

## 2024-04-03 LAB — HEPATIC FUNCTION PANEL
ALT: 15 U/L (ref 7–35)
AST: 25 (ref 13–35)
Alkaline Phosphatase: 49 (ref 25–125)
Bilirubin, Total: 0.3

## 2024-04-03 LAB — COMPREHENSIVE METABOLIC PANEL WITH GFR
Albumin: 4.1 (ref 3.5–5.0)
Calcium: 9.4 (ref 8.7–10.7)
Globulin: 2.4
eGFR: 67

## 2024-04-03 LAB — LIPID PANEL
Cholesterol: 138 (ref 0–200)
HDL: 56 (ref 35–70)
LDL Cholesterol: 66
Triglycerides: 80 (ref 40–160)

## 2024-04-03 LAB — BASIC METABOLIC PANEL WITH GFR
BUN: 42 — AB (ref 4–21)
CO2: 21 (ref 13–22)
Chloride: 102 (ref 99–108)
Creatinine: 0.9 (ref 0.5–1.1)
Glucose: 83
Potassium: 4.3 meq/L (ref 3.5–5.1)
Sodium: 135 — AB (ref 137–147)

## 2024-04-03 LAB — CBC: RBC: 4.05 (ref 3.87–5.11)

## 2024-04-03 LAB — TSH: TSH: 4.65 (ref 0.41–5.90)

## 2024-04-03 LAB — CBC AND DIFFERENTIAL
HCT: 38 (ref 36–46)
Hemoglobin: 13.2 (ref 12.0–16.0)
Platelets: 336 10*3/uL (ref 150–400)
WBC: 5.1

## 2024-04-04 ENCOUNTER — Encounter: Payer: Self-pay | Admitting: Internal Medicine

## 2024-04-10 ENCOUNTER — Non-Acute Institutional Stay: Payer: Medicare Other | Admitting: Internal Medicine

## 2024-04-10 ENCOUNTER — Encounter: Payer: Self-pay | Admitting: Internal Medicine

## 2024-04-10 VITALS — BP 118/70 | HR 70 | Temp 98.3°F | Resp 21 | Ht 64.0 in | Wt 130.0 lb

## 2024-04-10 DIAGNOSIS — E785 Hyperlipidemia, unspecified: Secondary | ICD-10-CM

## 2024-04-10 DIAGNOSIS — E039 Hypothyroidism, unspecified: Secondary | ICD-10-CM

## 2024-04-10 DIAGNOSIS — I10 Essential (primary) hypertension: Secondary | ICD-10-CM

## 2024-04-10 DIAGNOSIS — G8929 Other chronic pain: Secondary | ICD-10-CM

## 2024-04-10 DIAGNOSIS — R0609 Other forms of dyspnea: Secondary | ICD-10-CM

## 2024-04-10 DIAGNOSIS — F32A Depression, unspecified: Secondary | ICD-10-CM

## 2024-04-10 DIAGNOSIS — M545 Low back pain, unspecified: Secondary | ICD-10-CM | POA: Diagnosis not present

## 2024-04-10 NOTE — Progress Notes (Unsigned)
 Location:  [facility] features editor of Service:  Clinic (12)  Provider:   Code Status: *** Goals of Care:     12/06/2023    9:45 AM  Advanced Directives  Does Patient Have a Medical Advance Directive? Yes  Type of Estate agent of [place];Living will;Out of facility DNR (pink MOST or yellow form)  Does patient want to make changes to medical advance directive? No - Patient declined  Copy of Healthcare Power of Attorney in Chart? Yes - validated most recent copy scanned in chart (See row information)     Chief Complaint  Patient presents with   Follow-up    Patient has concerns about back pain.    HPI: Patient is a 83 y.o. female seen today for medical management of chronic diseases.     Past Medical History:  Diagnosis Date   Arthritis    OA   GERD (gastroesophageal reflux disease)    Hematuria    idiopathic- Dr. Mena Goes [facility]   Hyperlipemia    Hypertension    Hypothyroidism     Past Surgical History:  Procedure Laterality Date   ABDOMINAL HYSTERECTOMY     APPENDECTOMY     BACK SURGERY  2023   lumbar   EYE SURGERY     bilateral cataract extraction with IOL   GASTROC RECESSION EXTREMITY Left 02/23/2018   Procedure: Left Gastroc Recession;  Surgeon: Toni Arthurs, MD;  Location: [facility];  Service: Orthopedics;  Laterality: Left;   HAND SURGERY     left thumb MP   JOINT REPLACEMENT     right knee   PARTIAL KNEE ARTHROPLASTY  11/14/2012   Procedure: UNICOMPARTMENTAL KNEE;  Surgeon: Shelda Pal, MD;  Location: [facility];  Service: Orthopedics;  Laterality: Left;   TARSAL METATARSAL ARTHRODESIS Left 02/23/2018   Procedure: Left First and Second Tarsometatarsal Arthrodesis;  Surgeon: Toni Arthurs, MD;  Location: [facility];  Service: Orthopedics;  Laterality: Left;   thumb surgery     left   VAGINAL HYSTERECTOMY      Allergies  Allergen Reactions   Statins     Outpatient Encounter  Medications as of 04/10/2024  Medication Sig   amLODipine (NORVASC) 5 MG tablet Take 1 tablet (5 mg total) by mouth daily.   Bempedoic Acid-Ezetimibe (NEXLIZET) 180-10 MG TABS TAKE 1 TABLET BY MOUTH EVERY DAY AT 12 NOON.   Biotin 5000 MCG CAPS Take 1 capsule by mouth daily.   Calcium Citrate-Vitamin D (CALCIUM CITRATE + D) 315-5 MG-MCG TABS Take 1 tablet by mouth in the morning and at bedtime.   Evening Primrose Oil CAPS Take 1 capsule by mouth daily at 12 noon.   ketoconazole (NIZORAL) 2 % shampoo Apply 1 Application topically 2 (two) times a week.   levothyroxine (SYNTHROID) 25 MCG tablet TAKE 1 TABLET(25 MCG) BY MOUTH DAILY   levothyroxine (SYNTHROID) 25 MCG tablet Take 25 mcg by mouth once. 2 tablets (50 mcg), oral, once a morning.   Magnesium Citrate 100 MG TABS Take 150 mg by mouth every morning.   Multiple Vitamins-Minerals (MULTIVITAMIN WITH MINERALS) tablet Take 1 tablet by mouth daily.   olmesartan-hydrochlorothiazide (BENICAR HCT) 40-12.5 MG tablet Take 1 tablet by mouth daily.   omega-3 acid ethyl esters (LOVAZA) 1 g capsule Take 2 g by mouth 3 (three) times daily. 2 grams (2 caps), oral three times a day   potassium chloride SA (KLOR-CON M) 20 MEQ tablet TAKE 1 TABLET BY MOUTH  EVERY DAY   sennosides-docusate sodium (SENOKOT-S) 8.6-50 MG tablet Take 1 tablet by mouth in the morning and at bedtime. 1 tablet, oral, Twice a day, Take 1 tablet PO 2 times daily.   Turmeric 500 MG CAPS Take 500 mg by mouth daily.   acetaminophen (TYLENOL) 500 MG tablet Take 500 mg by mouth as needed. 2 tabs, oral, As needed, Tylenol 500 mg 2 tabs PO PRN TID pain (Patient not taking: Reported on 04/10/2024)   estradiol (ESTRACE VAGINAL) 0.1 MG/GM vaginal cream Place 1 Applicatorful vaginally 2 (two) times a week. (Patient not taking: Reported on 04/10/2024)   nystatin ointment (MYCOSTATIN) Apply 1 Application topically 2 (two) times daily. Apply outside your vaginal area (Patient not taking: Reported on  04/10/2024)   oxyCODONE (OXY IR/ROXICODONE) 5 MG immediate release tablet Take 5 mg by mouth as needed for severe pain (pain score 7-10). 5 mg, oral, as needed, take 1 tablet PO BID as needed for pain (Patient not taking: Reported on 04/10/2024)   oxymetazoline (AFRIN NASAL SPRAY) 0.05 % nasal spray Place 1 spray into both nostrils 2 (two) times daily as needed for congestion. (Patient not taking: Reported on 04/10/2024)   RESTASIS 0.05 % ophthalmic emulsion Place 1 drop into both eyes 2 (two) times daily. (Patient not taking: Reported on 04/10/2024)   simethicone (MYLICON) 125 MG chewable tablet Chew 125 mg by mouth as needed for flatulence. 1 tab, oral, As needed, Simethicone 125 mg TID PRN gas pain (Patient not taking: Reported on 04/10/2024)   No facility-administered encounter medications on file as of 04/10/2024.    Review of Systems:  Review of Systems  Health Maintenance  Topic Date Due   Medicare Annual Wellness (AWV)  04/25/2024   COVID-19 Vaccine (8 - 2024-25 season) 10/17/2024 (Originally 12/13/2023)   INFLUENZA VACCINE  07/27/2024   Pneumonia Vaccine 17+ Years old  Completed   DEXA SCAN  Completed   Zoster Vaccines- Shingrix  Completed   HPV VACCINES  Aged Out   Meningococcal B Vaccine  Aged Out   DTaP/Tdap/Td  Discontinued    Physical Exam: Vitals:   04/10/24 0818  BP: 118/70  Pulse: 70  Resp: (!) 21  Temp: 98.3 F (36.8 C)  SpO2: 98%  Weight: 130 lb (59 kg)  Height: 5\' 4"  (1.626 m)   Body mass index is 22.31 kg/m. Physical Exam  Labs reviewed: Basic Metabolic Panel: Recent Labs    06/09/23 1257 07/07/23 0000 12/29/23 1055 04/03/24 0000  NA 140 141 141 135*  K 4.6 4.6 4.2 4.3  CL 105 104 105 102  CO2 21 20 20 21   GLUCOSE 91  --  82  --   BUN 30* 35* 37* 42*  CREATININE 0.82 0.8 0.93 0.9  CALCIUM 9.9 9.7 9.6 9.4  TSH  --  2.76  --  4.65   Liver Function Tests: Recent Labs    07/07/23 0000 12/29/23 1055 04/03/24 0000  AST 31 20 25   ALT 11 10 15    ALKPHOS 52 53 49  BILITOT  --  0.4  --   PROT  --  6.5  --   ALBUMIN 4.2 4.1 4.1   No results for input(s): "LIPASE", "AMYLASE" in the last 8760 hours. No results for input(s): "AMMONIA" in the last 8760 hours. CBC: Recent Labs    07/07/23 0000 04/03/24 0000  WBC 6.2 5.1  HGB 12.9 13.2  HCT 39 38  PLT 370 336   Lipid Panel: Recent Labs  12/29/23 1055 04/03/24 0000  CHOL 138 138  HDL 59 56  LDLCALC 62 66  TRIG 90 80  LDLDIRECT 60  --    No results found for: "HGBA1C"  Procedures since last visit: No results found.  Assessment/Plan There are no diagnoses linked to this encounter.   Labs/tests ordered:  * No order type specified * Next appt:  04/27/2024

## 2024-04-10 NOTE — Patient Instructions (Addendum)
 Can use Nasal Normal Saline Over the counter Make appointment with Dr McCuen for normal Eye check up You need Tetanus shot DTAP

## 2024-04-27 ENCOUNTER — Ambulatory Visit: Payer: Medicare Other | Admitting: Adult Health

## 2024-04-27 ENCOUNTER — Encounter: Payer: Self-pay | Admitting: Adult Health

## 2024-04-27 DIAGNOSIS — Z0000 Encounter for general adult medical examination without abnormal findings: Secondary | ICD-10-CM

## 2024-04-27 NOTE — Progress Notes (Signed)
 Subjective:   Stephanie Baker is a 83 y.o. female who presents for Medicare Annual (Subsequent) preventive examination.  Visit Complete: Virtual I connected with  Stephanie Baker on 04/27/24 by a video and audio enabled telemedicine application and verified that I am speaking with the correct person using two identifiers.  Patient Location: Home  Provider Location: Office/Clinic  I discussed the limitations of evaluation and management by telemedicine. The patient expressed understanding and agreed to proceed.  Vital Signs: Because this visit was a virtual/telehealth visit, some criteria may be missing or patient reported. Any vitals not documented were not able to be obtained and vitals that have been documented are patient reported.  Patient Medicare AWV questionnaire was completed by the patient on 04/27/24; I have confirmed that all information answered by patient is correct and no changes since this date.  Cardiac Risk Factors include: advanced age (>107men, >50 women);dyslipidemia;hypertension     Objective:    Today's Vitals   04/27/24 1315  PainSc: 5    There is no height or weight on file to calculate BMI.     12/06/2023    9:45 AM 12/06/2023    8:38 AM 11/01/2023   10:42 AM 07/23/2023    9:49 AM 07/12/2023    1:49 PM 04/26/2023    3:22 PM 04/12/2023    3:11 PM  Advanced Directives  Does Patient Have a Medical Advance Directive? Yes Yes Yes Yes Yes Yes Yes  Type of Estate agent of [place];Living will;Out of facility DNR (pink MOST or yellow form) Healthcare Power of [place];Living will Healthcare Power of [place];Living will;Out of facility DNR (pink MOST or yellow form) Living will;Healthcare Power of [place];Out of facility DNR (pink MOST or yellow form) Living will;Healthcare Power of [place];Out of facility DNR (pink MOST or yellow form) Healthcare Power of [place];Living will;Out of facility DNR (pink MOST or yellow form)  Healthcare Power of [place];Living will;Out of facility DNR (pink MOST or yellow form)  Does patient want to make changes to medical advance directive? No - Patient declined  No - Patient declined No - Patient declined No - Patient declined No - Patient declined No - Patient declined  Copy of Healthcare Power of Attorney in Chart? Yes - validated most recent copy scanned in chart (See row information) Yes - validated most recent copy scanned in chart (See row information) Yes - validated most recent copy scanned in chart (See row information) Yes - validated most recent copy scanned in chart (See row information) Yes - validated most recent copy scanned in chart (See row information) Yes - validated most recent copy scanned in chart (See row information) No - copy requested    Current Medications (verified) Outpatient Encounter Medications as of 04/27/2024  Medication Sig   amLODipine  (NORVASC ) 5 MG tablet Take 1 tablet (5 mg total) by mouth daily.   Bempedoic Acid-Ezetimibe (NEXLIZET ) 180-10 MG TABS TAKE 1 TABLET BY MOUTH EVERY DAY AT 12 NOON.   Biotin 5000 MCG CAPS Take 1 capsule by mouth daily.   Calcium Citrate-Vitamin D (CALCIUM CITRATE + D) 315-5 MG-MCG TABS Take 1 tablet by mouth in the morning and at bedtime.   Evening Primrose Oil CAPS Take 1 capsule by mouth daily at 12 noon.   ketoconazole (NIZORAL) 2 % shampoo Apply 1 Application topically 2 (two) times a week.   levothyroxine  (SYNTHROID ) 25 MCG tablet TAKE 1 TABLET(25 MCG) BY MOUTH DAILY   levothyroxine  (SYNTHROID ) 25 MCG tablet Take 25 mcg  by mouth once. 2 tablets (50 mcg), oral, once a morning.   Magnesium Citrate 100 MG TABS Take 150 mg by mouth every morning.   Multiple Vitamins-Minerals (MULTIVITAMIN WITH MINERALS) tablet Take 1 tablet by mouth daily.   olmesartan -hydrochlorothiazide  (BENICAR  HCT) 40-12.5 MG tablet Take 1 tablet by mouth daily.   omega-3 acid ethyl esters (LOVAZA) 1 g capsule Take 2 g by mouth 3 (three) times  daily. 2 grams (2 caps), oral three times a day   potassium chloride  SA (KLOR-CON  M) 20 MEQ tablet TAKE 1 TABLET BY MOUTH EVERY DAY   RESTASIS 0.05 % ophthalmic emulsion Place 1 drop into both eyes 2 (two) times daily.   sennosides-docusate sodium  (SENOKOT-S) 8.6-50 MG tablet Take 1 tablet by mouth in the morning and at bedtime. 1 tablet, oral, Twice a day, Take 1 tablet PO 2 times daily.   Turmeric 500 MG CAPS Take 500 mg by mouth daily.   No facility-administered encounter medications on file as of 04/27/2024.    Allergies (verified) Statins   History: Past Medical History:  Diagnosis Date   Arthritis    OA   GERD (gastroesophageal reflux disease)    Hematuria    idiopathic- [facility]. [facility]   Hyperlipemia    Hypertension    Hypothyroidism    Past Surgical History:  Procedure Laterality Date   ABDOMINAL HYSTERECTOMY     APPENDECTOMY     BACK SURGERY  2023   lumbar   EYE SURGERY     bilateral cataract extraction with IOL   GASTROC RECESSION EXTREMITY Left 02/23/2018   Procedure: Left Gastroc Recession;  Surgeon: Amada Backer, MD;  Location: [facility];  Service: Orthopedics;  Laterality: Left;   HAND SURGERY     left thumb MP   JOINT REPLACEMENT     right knee   PARTIAL KNEE ARTHROPLASTY  11/14/2012   Procedure: UNICOMPARTMENTAL KNEE;  Surgeon: Bevin Bucks, MD;  Location: [facility];  Service: Orthopedics;  Laterality: Left;   TARSAL METATARSAL ARTHRODESIS Left 02/23/2018   Procedure: Left First and Second Tarsometatarsal Arthrodesis;  Surgeon: Amada Backer, MD;  Location: [facility];  Service: Orthopedics;  Laterality: Left;   thumb surgery     left   VAGINAL HYSTERECTOMY     Family History  Problem Relation Age of Onset   Heart attack Mother    Healthy Father    Breast cancer Sister 36   Healthy Brother    Alcoholism Brother    Breast cancer Maternal Aunt    Colon cancer Neg Hx    Stomach cancer Neg Hx    Liver cancer Neg Hx     Pancreatic cancer Neg Hx    Rectal cancer Neg Hx    Social History   Socioeconomic History   Marital status: Widowed    Spouse name: Not on file   Number of children: 3   Years of education: Not on file   Highest education level: Not on file  Occupational History   Not on file  Tobacco Use   Smoking status: Former    Current packs/day: 0.00    Types: Cigarettes    Quit date: 12/27/1988    Years since quitting: 35.3   Smokeless tobacco: Never  Vaping Use   Vaping status: Never Used  Substance and Sexual Activity   Alcohol  use: Yes    Alcohol /week: 7.0 standard drinks of alcohol     Types: 7 Glasses of wine per week  Comment: wine nightly   1 glass   Drug use: No   Sexual activity: Not on file  Other Topics Concern   Not on file  Social History Narrative   Not on file   Social Drivers of Health   Financial Resource Strain: Low Risk  (02/20/2024)   Received from [facility]   Overall Financial Resource Strain (CARDIA)    Difficulty of Paying Living Expenses: Not very hard  Food Insecurity: No Food Insecurity (02/20/2024)   [facility] from [facility]   Hunger Vital Sign    Worried About Running Out of Food in the Last Year: Never true    Ran Out of Food in the Last Year: Never true  Transportation Needs: No Transportation Needs (02/20/2024)   [facility] - [facility]    In the past 12 months, has lack of transportation kept you from medical appointments or from getting medications?: No    Lack of Transportation (Non-Medical): No  Physical Activity: Not on file  Stress: Not on file  Social Connections: Not on file    Tobacco Counseling Counseling given: Not Answered   Clinical Intake:  Pre-visit preparation completed: Yes  Pain : 0-10 Pain Score: 5  Pain Type: Chronic pain Pain Location: Back Pain Orientation: Mid Pain Radiating Towards: None Pain Descriptors / Indicators:  Aching Pain Onset: Other (comment) (almost a year) Pain Frequency: Constant Pain Relieving Factors: nothing Effect of Pain on Daily Activities: not able to do a lot of activities  Pain Relieving Factors: nothing  BMI - recorded: 23.17 Nutritional Status: BMI of 19-24  Normal Diabetes: No  How often do you need to have someone help you when you read instructions, pamphlets, or other written materials from your doctor or pharmacy?: 1 - Never What is the last grade level you completed in school?: college graduate  Interpreter Needed?: No  Information entered by :: Vincenza Dail Medina-Vargas DNP   Activities of Daily Living    04/27/2024    1:24 PM  In your present state of health, do you have any difficulty performing the following activities:  Hearing? 0  Vision? 0  Difficulty concentrating or making decisions? 0  Walking or climbing stairs? 1  Comment cannot walk without walker  Dressing or bathing? 0  Doing errands, shopping? 0  Preparing Food and eating ? N  Using the Toilet? N  In the past six months, have you accidently leaked urine? N  Do you have problems with loss of bowel control? N  Managing your Medications? N  Managing your Finances? N  Housekeeping or managing your Housekeeping? N    Patient Care Team: Marguerite Shiley, MD as PCP - General (Internal Medicine)  Indicate any recent Medical Services you may have received from other than [facility] providers in the past year (date may be approximate).     Assessment:   This is a routine wellness examination for Menaal.  Hearing/Vision screen Hearing Screening - Comments:: No hearing issues Vision Screening - Comments:: Dr Roxanne Copping   Goals Addressed             This Visit's Progress    Patient Stated       Get rid of back pain   Exercises at least 5X/week for 30 mins       Depression Screen    04/27/2024    1:07 PM 12/06/2023    9:44 AM 11/01/2023   10:41 AM 07/12/2023  1:47 PM 04/26/2023    3:23 PM  PHQ  2/9 Scores  PHQ - 2 Score 2 0 0 2 1  PHQ- 9 Score 3   10     Fall Risk    04/27/2024    1:07 PM 12/06/2023    9:44 AM 11/01/2023   10:41 AM 07/12/2023    1:47 PM 04/26/2023    3:22 PM  Fall Risk   Falls in the past year? 0 0 0 0 0  Number falls in past yr: 0 0 0 0 0  Injury with Fall? 0 0 0 0 0  Risk for fall due to : No Fall Risks No Fall Risks No Fall Risks No Fall Risks No Fall Risks  Follow up Falls evaluation completed Falls evaluation completed Falls evaluation completed Falls evaluation completed     MEDICARE RISK AT HOME: Medicare Risk at Home Any stairs in or around the home?: Yes If so, are there any without handrails?:  (has elevator) Home free of loose throw rugs in walkways, pet beds, electrical cords, etc?: Yes Adequate lighting in your home to reduce risk of falls?: Yes Life alert?: No Use of a cane, walker or w/c?: Yes (wheelchair and walker) Grab bars in the bathroom?: Yes Shower chair or bench in shower?: Yes Elevated toilet seat or a handicapped toilet?: No  TIMED UP AND GO:  Was the test performed?  No    Cognitive Function:        04/27/2024    1:11 PM 04/26/2023    3:23 PM  6CIT Screen  What Year? 0 points 0 points  What month? 0 points 0 points  What time? 0 points 0 points  Count back from 20 0 points 0 points  Months in reverse 0 points 0 points  Repeat phrase 2 points 0 points  Total Score 2 points 0 points    Immunizations Immunization History  Administered Date(s) Administered   Fluad Quad(high Dose 65+) 09/28/2022, 10/18/2023   Fluad Trivalent(High Dose 65+) 09/29/2023   Influenza Split 11/27/2010, 11/23/2011, 10/10/2012   Influenza, Quadrivalent, Recombinant, Inj, Pf 09/30/2018, 08/23/2019, 09/27/2020   Influenza,inj,Quad PF,6+ Mos 10/02/2013   Influenza,inj,quad, With Preservative 08/29/2017   Influenza-Unspecified 09/26/2016   Moderna Covid-19 Vaccine  Bivalent Booster 39yrs & up 10/09/2021, 10/18/2023   Moderna SARS-COV2 Booster  Vaccination 05/31/2022   PFIZER Comirnaty(Gray Top)Covid-19 Tri-Sucrose Vaccine 03/20/2023   PFIZER(Purple Top)SARS-COV-2 Vaccination 09/07/2019, 09/27/2019, 04/16/2020   PNEUMOCOCCAL CONJUGATE-20 08/09/2022   Pfizer Covid-19 Vaccine Bivalent Booster 30yrs & up 09/17/2022   Pneumococcal Polysaccharide-23 11/27/2010, 08/09/2022   RSV,unspecified 12/15/2022   Td (Adult),5 Lf Tetanus Toxid, Preservative Free 11/02/2010, 11/27/2010   Tdap 12/27/2008   Zoster Recombinant(Shingrix) 07/26/2017, 01/06/2018   Zoster, Live 12/27/2009, 01/15/2010    Tdap:  will need to be updated  Flu Vaccine status: Up to date  Pneumococcal vaccine status: Up to date  Covid-19 vaccine status: Information provided on how to obtain vaccines.   Qualifies for Shingles Vaccine? Yes   Zostavax completed Yes   Shingrix Completed?: Yes  Screening Tests Health Maintenance  Topic Date Due   Medicare Annual Wellness (AWV)  04/25/2024   COVID-19 Vaccine (8 - 2024-25 season) 10/17/2024 (Originally 12/13/2023)   INFLUENZA VACCINE  07/27/2024   Pneumonia Vaccine 79+ Years old  Completed   DEXA SCAN  Completed   Zoster Vaccines- Shingrix  Completed   HPV VACCINES  Aged Out   Meningococcal B Vaccine  Aged Out   DTaP/Tdap/Td  Discontinued  Health Maintenance  Health Maintenance Due  Topic Date Due   Medicare Annual Wellness (AWV)  04/25/2024    Colorectal cancer screening: Type of screening: Colonoscopy. Completed 11/21/18. Repeat every aged out years  Mammogram status: Completed 10/13/22. Repeat every year  Bone Density status: Completed 11/28/2006. Results reflect: Bone density results: NORMAL. Repeat every 2 years.  Lung Cancer Screening: (Low Dose CT Chest recommended if Age 38-80 years, 20 pack-year currently smoking OR have quit w/in 15years.) does qualify.   Lung Cancer Screening Referral: No  Additional Screening:  Hepatitis C Screening: does qualify; Completed Not done  Vision Screening:  Recommended annual ophthalmology exams for early detection of glaucoma and other disorders of the eye. Is the patient up to date with their annual eye exam?  Yes  Who is the provider or what is the name of the office in which the patient attends annual eye exams?  Dr. Roslynn Coombes If pt is not established with a provider, would they like to be referred to a provider to establish care? No .   Dental Screening: Recommended annual dental exams for proper oral hygiene  Diabetic Foot Exam: Diabetic Foot Exam: Overdue, Pt has been advised about the importance in completing this exam. Pt is scheduled for diabetic foot exam on Not diabetic.  Community Resource Referral / Chronic Care Management: CRR required this visit?  No   CCM required this visit?  No     Plan:     I have personally reviewed and noted the following in the patient's chart:   Medical and social history Use of alcohol , tobacco or illicit drugs  Current medications and supplements including opioid prescriptions. Patient is not currently taking opioid prescriptions. Functional ability and status Nutritional status Physical activity Advanced directives List of other physicians Hospitalizations, surgeries, and ER visits in previous 12 months Vitals Screenings to include cognitive, depression, and falls Referrals and appointments  In addition, I have reviewed and discussed with patient certain preventive protocols, quality metrics, and best practice recommendations. A written personalized care plan for preventive services as well as general preventive health recommendations were provided to patient.     Eliska Hamil Medina-Vargas, NP   04/27/2024   After Visit Summary: (Mail) Due to this being a telephonic visit, the after visit summary with patients personalized plan was offered to patient via mail   Nurse Notes: Needs to be done annually, needs hep C antibody test, Tdap vaccine

## 2024-04-27 NOTE — Progress Notes (Signed)
 This service is provided via telemedicine  No vital signs collected/recorded due to the encounter was a telemedicine visit.   Location of patient (ex: home, work):  home  Patient consents to a telephone visit: yes  Location of the provider (ex: office, home):  [facility]   Name of any referring provider:  N/A  Names of all persons participating in the telemedicine service and their role in the encounter:  Stephanie Baker/ RMA, Stephanie C Grayce Sessions, NP  and Patient.   Time spent on call:  11

## 2024-04-27 NOTE — Patient Instructions (Signed)
  Ms. Stephanie Baker , Thank you for taking time to come for your Medicare Wellness Visit. I appreciate your ongoing commitment to your health goals. Please review the following plan we discussed and let me know if I can assist you in the future.   These are the goals we discussed:  Goals      Patient Stated     Get rid of back pain   Exercises at least 5X/week for 30 mins        This is a list of the screening recommended for you and due dates:  Health Maintenance  Topic Date Due   Medicare Annual Wellness Visit  04/25/2024   COVID-19 Vaccine (8 - 2024-25 season) 10/17/2024*   Flu Shot  07/27/2024   Pneumonia Vaccine  Completed   DEXA scan (bone density measurement)  Completed   Zoster (Shingles) Vaccine  Completed   HPV Vaccine  Aged Out   Meningitis B Vaccine  Aged Out   DTaP/Tdap/Td vaccine  Discontinued  *Topic was postponed. The date shown is not the original due date.

## 2024-05-07 ENCOUNTER — Encounter: Admitting: Adult Health

## 2024-05-08 ENCOUNTER — Encounter: Admitting: Internal Medicine

## 2024-06-08 ENCOUNTER — Ambulatory Visit: Payer: Self-pay | Admitting: Cardiology

## 2024-06-08 ENCOUNTER — Encounter: Payer: Self-pay | Admitting: Cardiology

## 2024-06-08 ENCOUNTER — Ambulatory Visit: Attending: Cardiology | Admitting: Cardiology

## 2024-06-08 VITALS — BP 108/68 | HR 71 | Resp 16 | Ht 64.0 in | Wt 138.0 lb

## 2024-06-08 DIAGNOSIS — Z789 Other specified health status: Secondary | ICD-10-CM | POA: Insufficient documentation

## 2024-06-08 DIAGNOSIS — I2584 Coronary atherosclerosis due to calcified coronary lesion: Secondary | ICD-10-CM | POA: Insufficient documentation

## 2024-06-08 DIAGNOSIS — I10 Essential (primary) hypertension: Secondary | ICD-10-CM | POA: Insufficient documentation

## 2024-06-08 DIAGNOSIS — E782 Mixed hyperlipidemia: Secondary | ICD-10-CM | POA: Insufficient documentation

## 2024-06-08 DIAGNOSIS — I2510 Atherosclerotic heart disease of native coronary artery without angina pectoris: Secondary | ICD-10-CM | POA: Diagnosis present

## 2024-06-08 DIAGNOSIS — I700 Atherosclerosis of aorta: Secondary | ICD-10-CM | POA: Diagnosis present

## 2024-06-08 NOTE — Progress Notes (Signed)
 Cardiology Office Note:  .   Date:  06/08/2024  ID:  Stephanie Baker, DOB Dec 09, 1941, MRN [id number] PCP:  Marguerite Shiley, MD  Former Cardiology Providers: NA [facility] Providers Cardiologist:  Olinda Bertrand, DO , [facility] (established care 02/04/2023) Electrophysiologist:  None  Click to update primary MD,subspecialty MD or APP then REFRESH:1}    Chief Complaint  Patient presents with   Precordial pain   Follow-up    History of Present Illness: .   Stephanie Baker is a 83 y.o.  female whose past medical history and cardiovascular risk factors includes: History of coronary and aortic atherosclerosis (nongated CT study 2019), hyperlipidemia, history of intolerance to statin, former smoker (25-pack-year history, quit 33 years ago), history of PE / DVT(postoperative May 2023, currently on anticoagulation), history of pericarditis, hypertension.   She was referred to the practice for evaluation of precordial pain.  She did undergo echocardiogram and stress test as part of the workup.  Given her risk factors and a coronary artery calcification aortic atherosclerosis recommended lipid management.  Patient states that she is unable to tolerate statin therapy and therefore was started on Nexlizet .  She wanted to avoid PCSK9 inhibitors or Leqvio if possible.  She presents today for 1 year follow-up visit.  Over the last 1 year she denies anginal chest pain or heart failure symptoms. She continues to have back pain despite undergoing surgical intervention and has difficulty walking.  She continues to use a walker to help assist with ambulation and to prevent falls. Blood pressures have significantly improved since last office visit. She takes both olmesartan /HCTZ and amlodipine  in the morning.  She is a retired Adult nurse and her husband was orthopedic surgery within the community.  Review of Systems: .   Review of Systems  Cardiovascular:  Positive for dyspnea on  exertion (chronic and stable). Negative for chest pain, claudication, irregular heartbeat, leg swelling, near-syncope, orthopnea, palpitations, paroxysmal nocturnal dyspnea and syncope.  Hematologic/Lymphatic: Negative for bleeding problem.  Musculoskeletal:  Positive for back pain.    Studies Reviewed:   EKG: EKG Interpretation Date/Time:  Friday June 08 2024 11:11:48 EDT Ventricular Rate:  70 PR Interval:  156 QRS Duration:  74 QT Interval:  394 QTC Calculation: 425 R Axis:   -38  Text Interpretation: Normal sinus rhythm Left axis deviation Low voltage QRS Cannot rule out Anterior infarct , age undetermined When compared with ECG of 21-Feb-2018 13:59,  No significant change since last tracing Confirmed by Olinda Bertrand [phone]) on 06/08/2024 11:34:57 AM  Echocardiogram: 02/11/2023: Normal LV systolic function with visual EF 60-65%. Left ventricle cavity is normal in size. Normal left ventricular wall thickness. Normal global wall motion. Normal diastolic filling pattern, normal LAP.  Mild (Grade I) aortic regurgitation. Mild tricuspid regurgitation. No evidence of pulmonary hypertension. Mild pulmonic regurgitation. Compared to 08/2018 moderate AR is now mild otherwise no significant change.   Stress Testing: Lexiscan  Tetrofosmin  stress test 02/22/2023: Low risk study.  See report for additional details  RADIOLOGY: NA  Risk Assessment/Calculations:   NA   Labs:       Latest Ref Rng & Units 04/03/2024   12:00 AM 07/07/2023   12:00 AM 05/17/2022   12:00 AM  CBC  WBC  5.1     6.2     4.5      Hemoglobin 12.0 - 16.0 13.2     12.9     10.7      Hematocrit 36 - 46 38  39     31      Platelets 150 - 400 K/uL 336     370     423         This result is from an external source.       Latest Ref Rng & Units 04/03/2024   12:00 AM 12/29/2023   10:55 AM 07/07/2023   12:00 AM  BMP  Glucose 70 - 99 mg/dL  82    BUN 4 - 21 42     37  35      Creatinine 0.5 - 1.1 0.9     0.93   0.8      BUN/Creat Ratio 12 - 28  40    Sodium 137 - 147 135     141  141      Potassium 3.5 - 5.1 mEq/L 4.3     4.2  4.6      Chloride 99 - 108 102     105  104      CO2 13 - 22 21     20  20       Calcium 8.7 - 10.7 9.4     9.6  9.7         This result is from an external source.      Latest Ref Rng & Units 04/03/2024   12:00 AM 12/29/2023   10:55 AM 07/07/2023   12:00 AM  CMP  Glucose 70 - 99 mg/dL  82    BUN 4 - 21 42     37  35      Creatinine 0.5 - 1.1 0.9     0.93  0.8      Sodium 137 - 147 135     141  141      Potassium 3.5 - 5.1 mEq/L 4.3     4.2  4.6      Chloride 99 - 108 102     105  104      CO2 13 - 22 21     20  20       Calcium 8.7 - 10.7 9.4     9.6  9.7      Total Protein 6.0 - 8.5 g/dL  6.5    Total Bilirubin 0.0 - 1.2 mg/dL  0.4    Alkaline Phos 25 - 125 49     53  52      AST 13 - 35 25     20  31       ALT 7 - 35 U/L 15     10  11          This result is from an external source.    Lab Results  Component Value Date   CHOL 138 04/03/2024   HDL 56 04/03/2024   LDLCALC 66 04/03/2024   LDLDIRECT 60 12/29/2023   TRIG 80 04/03/2024   No results for input(s): LIPOA in the last 8760 hours. No components found for: NTPROBNP No results for input(s): PROBNP in the last 8760 hours. Recent Labs    07/07/23 0000 04/03/24 0000  TSH 2.76 4.65    Physical Exam:    Today's Vitals   06/08/24 1100  BP: 108/68  Pulse: 71  Resp: 16  SpO2: 98%  Weight: 138 lb (62.6 kg)  Height: 5' 4 (1.626 m)   Body mass index is 23.69 kg/m. Wt Readings from Last 3 Encounters:  06/08/24 138 lb (62.6 kg)  04/10/24 130  lb (59 kg)  12/06/23 135 lb (61.2 kg)    Physical Exam  Constitutional: No distress.  Age appropriate, hemodynamically stable, walks w/ walker  Neck: No JVD present.  Cardiovascular: Normal rate, regular rhythm, S1 normal, S2 normal, intact distal pulses and normal pulses. Exam reveals no gallop, no S3 and no S4.  Murmur heard. Holosystolic murmur  is present with a grade of 3/6 at the apex. Pulmonary/Chest: Effort normal and breath sounds normal. No stridor. She has no wheezes. She has no rales.  Abdominal: Soft. Bowel sounds are normal. She exhibits no distension. There is no abdominal tenderness.  Musculoskeletal:        General: No edema.     Cervical back: Neck supple.  Neurological: She is alert and oriented to person, place, and time. She has intact cranial nerves (2-12).  Skin: Skin is warm and moist.   Impression & Recommendation(s):  Impression:   ICD-10-CM   1. Calcification of native coronary artery  I25.10 EKG 12-Lead   I25.84     2. Atherosclerosis of aorta (HCC)  I70.0     3. Mixed hyperlipidemia  E78.2     4. Statin intolerance  Z78.9     5. Benign hypertension  I10        Recommendation(s):  Calcification of native coronary artery Atherosclerosis of aorta (HCC) No reoccurrence of chest pain since last office visit. EKG is nonischemic. Intolerant to statin therapy. Currently on Nexlizet -outside labs reviewed LDL in the acceptable Prior echo and stress test reviewed. Patient endorses an intermittent episodes of shortness of breath not brought on by effort related activities but quite random.  I have asked her to discuss noncardiac causes of shortness of breath with PCP as well. She will let us  know if the symptoms worsen in intensity frequency or duration which would merit re evaluation.  Patient agreeable with the plan of care.  Mixed hyperlipidemia Statin intolerance Currently on Nexlizet  and tolerating it well. Most recent lipid profile from [facility] database reviewed, LDL 66 mg/dL In the past patient has endorsed did not wanting to be on Leqvio or Repatha due to how they are administered.  Benign hypertension Office blood pressures have significantly improved when compared to last year. Currently taking all of her antihypertensive medications in the morning. Recommending taking her amlodipine  5 mg p.o.  every evening. Recommending taking her Benicar /HCTZ 40/12.5 mg p.o. every morning Recommended goal SBP 130 mmHg.   Cardiology following peripherally, managed by primary care provider.  Recommended follow-up every 2 years.  Patient states that she prefers to be seen on an annual basis.  Discussed management of at least 2 chronic comorbid conditions, prescription drug management, EKG ordered and independently reviewed, outside labs 04/03/2024 reviewed  Orders Placed:  Orders Placed This Encounter  Procedures   EKG 12-Lead     Final Medication List:   No orders of the defined types were placed in this encounter.   Medications Discontinued During This Encounter  Medication Reason   ketoconazole (NIZORAL) 2 % shampoo Patient Preference   Evening Primrose Oil CAPS Patient Preference   Magnesium Citrate 100 MG TABS Patient Preference   Multiple Vitamins-Minerals (MULTIVITAMIN WITH MINERALS) tablet Patient Preference   sennosides-docusate sodium  (SENOKOT-S) 8.6-50 MG tablet Patient Preference     Current Outpatient Medications:    amLODipine  (NORVASC ) 5 MG tablet, Take 1 tablet (5 mg total) by mouth daily., Disp: 90 tablet, Rfl: 3   Bempedoic Acid-Ezetimibe (NEXLIZET ) 180-10 MG TABS, TAKE 1 TABLET BY MOUTH  EVERY DAY AT 12 NOON., Disp: 30 tablet, Rfl: 4   Biotin 5000 MCG CAPS, Take 1 capsule by mouth daily., Disp: , Rfl:    Calcium Citrate-Vitamin D (CALCIUM CITRATE + D) 315-5 MG-MCG TABS, Take 1 tablet by mouth in the morning and at bedtime., Disp: , Rfl:    levothyroxine  (SYNTHROID ) 25 MCG tablet, Take 25 mcg by mouth once. 2 tablets (50 mcg), oral, once a morning., Disp: , Rfl:    olmesartan -hydrochlorothiazide  (BENICAR  HCT) 40-12.5 MG tablet, Take 1 tablet by mouth daily., Disp: 90 tablet, Rfl: 3   omega-3 acid ethyl esters (LOVAZA) 1 g capsule, Take 2 g by mouth 3 (three) times daily. 2 grams (2 caps), oral three times a day, Disp: , Rfl:    potassium chloride  SA (KLOR-CON  M) 20 MEQ  tablet, TAKE 1 TABLET BY MOUTH EVERY DAY, Disp: 90 tablet, Rfl: 3   RESTASIS 0.05 % ophthalmic emulsion, Place 1 drop into both eyes 2 (two) times daily., Disp: , Rfl:    Turmeric 500 MG CAPS, Take 500 mg by mouth daily., Disp: , Rfl:    levothyroxine  (SYNTHROID ) 25 MCG tablet, TAKE 1 TABLET(25 MCG) BY MOUTH DAILY, Disp: 90 tablet, Rfl: 1  Consent:   NA  Disposition:   1 year follow-up sooner if needed  Her questions and concerns were addressed to her satisfaction. She voices understanding of the recommendations provided during this encounter.    Signed, Awilda Bogus, [facility]  [facility]  A Division of [facility] [address], [place], [place] [address]  [place], [place] [address] 06/08/2024 12:08 PM

## 2024-06-08 NOTE — Patient Instructions (Addendum)
 Medication Instructions:  No Changes  Per Dr. Albert Huff, please take your Amlodipine  (Norvasc ) at bedtime/in evening.  Also, take your olmesartan -hydrochlorothiazide  (BENICAR  HCT) in the morning.  *If you need a refill on your cardiac medications before your next appointment, please call your pharmacy*  Lab Work: None  Follow-Up: At [facility], you and your health needs are our priority.  As part of our continuing mission to provide you with exceptional heart care, our providers are all part of one team.  This team includes your primary Cardiologist (physician) and Advanced Practice Providers or APPs (Physician Assistants and Nurse Practitioners) who all work together to provide you with the care you need, when you need it.  Your next appointment:   1 year(s)  Provider:   Olinda Bertrand, DO    Other Instructions Please call us  or send a MyChart message with any Cardiology related questions/concerns.  [phone].  Thank you!

## 2024-06-30 ENCOUNTER — Other Ambulatory Visit: Payer: Self-pay | Admitting: Internal Medicine

## 2024-07-02 ENCOUNTER — Ambulatory Visit: Payer: Self-pay

## 2024-07-02 ENCOUNTER — Encounter: Payer: Self-pay | Admitting: Adult Health

## 2024-07-02 ENCOUNTER — Ambulatory Visit: Admitting: Adult Health

## 2024-07-02 VITALS — BP 120/74 | HR 71 | Temp 98.1°F | Wt 138.0 lb

## 2024-07-02 DIAGNOSIS — M25552 Pain in left hip: Secondary | ICD-10-CM | POA: Diagnosis not present

## 2024-07-02 DIAGNOSIS — G8929 Other chronic pain: Secondary | ICD-10-CM

## 2024-07-02 DIAGNOSIS — N949 Unspecified condition associated with female genital organs and menstrual cycle: Secondary | ICD-10-CM | POA: Diagnosis not present

## 2024-07-02 DIAGNOSIS — M545 Low back pain, unspecified: Secondary | ICD-10-CM | POA: Diagnosis not present

## 2024-07-02 MED ORDER — ACETAMINOPHEN 500 MG PO TABS: 1000.0000 mg | ORAL_TABLET | Freq: Two times a day (BID) | ORAL | 0 refills | Status: DC

## 2024-07-02 MED ORDER — PREDNISONE 20 MG PO TABS: 20.0000 mg | ORAL_TABLET | Freq: Every day | ORAL | 0 refills | Status: DC

## 2024-07-02 MED ORDER — METHOCARBAMOL 500 MG PO TABS: 500.0000 mg | ORAL_TABLET | Freq: Three times a day (TID) | ORAL | 0 refills | Status: DC | PRN

## 2024-07-02 NOTE — Progress Notes (Signed)
 Location:  [facility]  POS: Clinic  Provider: Tawni America, ANP   Goals of Care:     12/06/2023    9:45 AM  Advanced Directives  Does Patient Have a Medical Advance Directive? Yes  Type of Estate agent of [place];Living will;Out of facility DNR (pink MOST or yellow form)  Does patient want to make changes to medical advance directive? No - Patient declined  Copy of Healthcare Power of Attorney in Chart? Yes - validated most recent copy scanned in chart (See row information)     Chief Complaint  Patient presents with   Back Pain    Lower back pain    HPI:   History of Present Illness   Stephanie Baker is an 83 year old female with spinal stenosis who presents with chronic low back pain.  She has been experiencing chronic low back pain for a long time, localized to the middle to low back without radiation to the legs. The pain is constant and significantly impacts her mobility at home. She describes being 'tired of it.'  She underwent lumbar surgery in 2023 for spinal stenosis, which did not alleviate her pain. She has not been taking regular medication for the pain but took Tylenol  today, which did not provide sufficient relief. She manages her medications herself .  No pain radiating to the buttocks and no recent falls. She experiences discomfort in the perineal area but denies any burning during urination or signs of a bladder infection.  Her social history includes driving herself to pick up medications and spending a fair amount of time sitting and lying down. She has a good cushion in her chair.       No numbness or tingling  Note per neurosurgery 04/19/24 S/p T11-L4 reinstrumentation, T12-L1 through L2-3 SPO's (Shaffrey 04/28/22) 2  Injections  02/24/23 CT guided bilateral SI joint injections [facility]) - no relief 04/06/24 bilateral L3,L4, L5 MBB (Dr. Arminda)   She tried multiple measures with no relief.  Recommended physiatry  consult with Dr Claudene   Also has had chronic irritation in the vaginal area. No relief with estrogen or nystatin .  Past Medical History:  Diagnosis Date   Arthritis    OA   GERD (gastroesophageal reflux disease)    Hematuria    idiopathic- Dr. [facility]   Hyperlipemia    Hypertension    Hypothyroidism     Past Surgical History:  Procedure Laterality Date   ABDOMINAL HYSTERECTOMY     APPENDECTOMY     BACK SURGERY  2023   lumbar   EYE SURGERY     bilateral cataract extraction with IOL   GASTROC RECESSION EXTREMITY Left 02/23/2018   Procedure: Left Gastroc Recession;  Surgeon: Kit Rush, MD;  Location: [facility];  Service: Orthopedics;  Laterality: Left;   HAND SURGERY     left thumb MP   JOINT REPLACEMENT     right knee   PARTIAL KNEE ARTHROPLASTY  11/14/2012   Procedure: UNICOMPARTMENTAL KNEE;  Surgeon: Donnice JONETTA Car, MD;  Location: [facility];  Service: Orthopedics;  Laterality: Left;   TARSAL METATARSAL ARTHRODESIS Left 02/23/2018   Procedure: Left First and Second Tarsometatarsal Arthrodesis;  Surgeon: Kit Rush, MD;  Location: [facility];  Service: Orthopedics;  Laterality: Left;   thumb surgery     left   VAGINAL HYSTERECTOMY      Allergies  Allergen Reactions   Statins     Outpatient Encounter Medications as  of 07/02/2024  Medication Sig   acetaminophen  (TYLENOL ) 500 MG tablet Take 2 tablets (1,000 mg total) by mouth in the morning and at bedtime.   amLODipine  (NORVASC ) 5 MG tablet Take 1 tablet (5 mg total) by mouth daily.   Bempedoic Acid-Ezetimibe (NEXLIZET ) 180-10 MG TABS TAKE 1 TABLET BY MOUTH EVERY DAY AT 12 NOON.   Biotin 5000 MCG CAPS Take 1 capsule by mouth daily.   Calcium Citrate-Vitamin D (CALCIUM CITRATE + D) 315-5 MG-MCG TABS Take 1 tablet by mouth in the morning and at bedtime.   levothyroxine  (SYNTHROID ) 25 MCG tablet Take 25 mcg by mouth once. 2 tablets (50 mcg), oral, once a morning.   methocarbamol   (ROBAXIN ) 500 MG tablet Take 1 tablet (500 mg total) by mouth every 8 (eight) hours as needed for muscle spasms.   olmesartan -hydrochlorothiazide  (BENICAR  HCT) 40-12.5 MG tablet TAKE 1 TABLET BY MOUTH DAILY   omega-3 acid ethyl esters (LOVAZA) 1 g capsule Take 2 g by mouth 3 (three) times daily. 2 grams (2 caps), oral three times a day   potassium chloride  SA (KLOR-CON  M) 20 MEQ tablet TAKE 1 TABLET BY MOUTH EVERY DAY   predniSONE  (DELTASONE ) 20 MG tablet Take 1 tablet (20 mg total) by mouth daily with breakfast.   RESTASIS 0.05 % ophthalmic emulsion Place 1 drop into both eyes 2 (two) times daily.   Turmeric 500 MG CAPS Take 500 mg by mouth daily.   [DISCONTINUED] levothyroxine  (SYNTHROID ) 25 MCG tablet TAKE 1 TABLET(25 MCG) BY MOUTH DAILY (Patient not taking: Reported on 07/02/2024)   No facility-administered encounter medications on file as of 07/02/2024.    Review of Systems:  Review of Systems  Constitutional:  Positive for activity change. Negative for appetite change, chills, diaphoresis, fatigue, fever and unexpected weight change.  HENT:  Negative for congestion.   Respiratory:  Negative for cough, shortness of breath and wheezing.   Cardiovascular:  Positive for leg swelling. Negative for chest pain and palpitations.  Gastrointestinal:  Negative for abdominal distention, abdominal pain, constipation and diarrhea.  Genitourinary:  Negative for difficulty urinating, dysuria, enuresis, flank pain, frequency, menstrual problem, pelvic pain, vaginal bleeding, vaginal discharge and vaginal pain.       Vaginal irritation  Musculoskeletal:  Positive for arthralgias, back pain and gait problem. Negative for joint swelling and myalgias.  Neurological:  Negative for dizziness, tremors, seizures, syncope, facial asymmetry, speech difficulty, weakness, light-headedness, numbness and headaches.  Psychiatric/Behavioral:  Negative for agitation, behavioral problems and confusion.     Health  Maintenance  Topic Date Due   COVID-19 Vaccine (8 - 2024-25 season) 10/17/2024 (Originally 12/13/2023)   INFLUENZA VACCINE  07/27/2024   Medicare Annual Wellness (AWV)  04/27/2025   Pneumococcal Vaccine: 50+ Years  Completed   DEXA SCAN  Completed   Zoster Vaccines- Shingrix  Completed   Hepatitis B Vaccines  Aged Out   HPV VACCINES  Aged Out   Meningococcal B Vaccine  Aged Out   DTaP/Tdap/Td  Discontinued    Physical Exam: Vitals:   07/02/24 1540  BP: 120/74  Pulse: 71  Temp: 98.1 F (36.7 C)  SpO2: 99%  Weight: 138 lb (62.6 kg)   Body mass index is 23.69 kg/m. Physical Exam Constitutional:      General: She is not in acute distress.    Appearance: She is not diaphoretic.  HENT:     Head: Normocephalic and atraumatic.  Neck:     Vascular: No JVD.  Cardiovascular:  Rate and Rhythm: Normal rate and regular rhythm.     Heart sounds: No murmur heard. Pulmonary:     Effort: Pulmonary effort is normal. No respiratory distress.     Breath sounds: Normal breath sounds. No wheezing.  Genitourinary:    Vagina: No vaginal discharge.     Comments: No erythema or drainage.  Atrophy to the vaginal area.  Musculoskeletal:     Lumbar back: Tenderness present. No swelling, edema, deformity, signs of trauma, lacerations, spasms or bony tenderness. Normal range of motion. Negative right straight leg raise test and negative left straight leg raise test. No scoliosis.     Comments: Trace edema to ankles Strength 4/5 to BLE  Skin:    General: Skin is warm and dry.  Neurological:     Mental Status: She is alert and oriented to person, place, and time.     Comments: No pain with ROM to either hip Pain on palpation of the left hip lesser trochanter      Labs reviewed: Basic Metabolic Panel: Recent Labs    07/07/23 0000 12/29/23 1055 04/03/24 0000  NA 141 141 135*  K 4.6 4.2 4.3  CL 104 105 102  CO2 20 20 21   GLUCOSE  --  82  --   BUN 35* 37* 42*  CREATININE 0.8 0.93  0.9  CALCIUM 9.7 9.6 9.4  TSH 2.76  --  4.65   Liver Function Tests: Recent Labs    07/07/23 0000 12/29/23 1055 04/03/24 0000  AST 31 20 25   ALT 11 10 15   ALKPHOS 52 53 49  BILITOT  --  0.4  --   PROT  --  6.5  --   ALBUMIN 4.2 4.1 4.1   No results for input(s): LIPASE, AMYLASE in the last 8760 hours. No results for input(s): AMMONIA in the last 8760 hours. CBC: Recent Labs    07/07/23 0000 04/03/24 0000  WBC 6.2 5.1  HGB 12.9 13.2  HCT 39 38  PLT 370 336   Lipid Panel: Recent Labs    12/29/23 1055 04/03/24 0000  CHOL 138 138  HDL 59 56  LDLCALC 62 66  TRIG 90 80  LDLDIRECT 60  --    No results found for: HGBA1C  Procedures since last visit: No results found.   Assessment and Plan    Chronic Low Back Pain Chronic low back pain likely due to spinal stenosis and arthritis changes. Pain persists, affecting daily activities. Focus on pain management with medications rather than further surgery per pt wishes - Prescribe Tylenol  1 gram twice daily. - Prescribe Robaxin  as needed for severe pain, caution against driving until response is known. - Prescribe low-dose prednisone  for 5 days, taken in the morning with food. - Re-evaluate in 2 weeks if pain persists to consider further imaging of hip and back.  Hip Pain Left Tenderness suggests possible arthritis. Prednisone  expected to help with inflammation. - Prescribe low-dose prednisone  for 5 days, taken in the morning with food. - Re-evaluate in 2 weeks if pain persists to consider further imaging of hip and back.  Perineal Discomfort Long term issue No signs of infection on exam, has atrophy Discomfort possibly due to menopause-related changes. Consider estrogen cream if symptoms persist. - Discuss potential use of estrogen cream if symptoms persist or worsen. She has been prescribed this before but I am not sure she was using it regularly. Does not remember taking it.          Labs/tests  ordered:  * No order type specified * Next appt:  07/16/2024   Total time :  time greater than 50% of total time spent doing pt counseling and coordination of care

## 2024-07-02 NOTE — Telephone Encounter (Signed)
 FYI Only or Action Required?: FYI only for provider.  Patient was last seen in primary care on 04/27/2024 by Medina-Vargas, Jereld BROCKS, NP. Called Nurse Triage reporting Tailbone Pain. Symptoms began a week ago. Interventions attempted: Nothing. Symptoms are: unchanged.  Triage Disposition: See PCP When Office is Open (Within 3 Days)  Patient/caregiver understands and will follow disposition?: Yes  Scheduled appt with Tawni, NP today 340. Spoke with Powell, clinical Nurse at [facility]  Copied from [employer] [phone]. Topic: Clinical - Red Word Triage >> Jul 02, 2024  9:32 AM Mercer PEDLAR wrote: Red Word that prompted transfer to Nurse Triage: Powell, nurse from [facility] to schedule an appointment due to painful lower back, pain level 9/10. Reason for Disposition  [1] MODERATE back pain (e.g., interferes with normal activities) AND [2] present > 3 days  Answer Assessment - Initial Assessment Questions 1. ONSET: When did the pain begin?      Ongoing issue 2. LOCATION: Where does it hurt? (upper, mid or lower back)     Sacral region 3. SEVERITY: How bad is the pain?  (e.g., Scale 1-10; mild, moderate, or severe)   - MILD (1-3): Doesn't interfere with normal activities.    - MODERATE (4-7): Interferes with normal activities or awakens from sleep.    - SEVERE (8-10): Excruciating pain, unable to do any normal activities.      8/10 4. PATTERN: Is the pain constant? (e.g., yes, no; constant, intermittent)      Constant  8. MEDICINES: What have you taken so far for the pain? (e.g., nothing, acetaminophen , NSAIDS)     Nurse didn't say  10. OTHER SYMPTOMS: Do you have any other symptoms? (e.g., fever, abdomen pain, burning with urination, blood in urine)       No  Protocols used: Back Pain-A-AH

## 2024-07-02 NOTE — Telephone Encounter (Signed)
 Appointment scheduled for 07/02/2024

## 2024-07-02 NOTE — Patient Instructions (Signed)
 Try tylenol  twice a day on a schedule Robaxin  as needed for severe pain Prednisone  for 5 days for inflammation  Follow up with me in 2 weeks to see how you are doing.

## 2024-07-04 ENCOUNTER — Telehealth: Payer: Self-pay | Admitting: *Deleted

## 2024-07-04 NOTE — Telephone Encounter (Signed)
 Spoke with pharmacy tech, patient will have to get tylenol  over the counter, methocarbamol  is not covered (pharmacy had not let us  know), and the others were received.  Christy please advise about an alternative to Methocarbamol 

## 2024-07-04 NOTE — Telephone Encounter (Signed)
 Copied from CRM [id number]. Topic: Clinical - Prescription Issue >> Jul 04, 2024  9:57 AM Vivian Z wrote: Reason for CRM: Patient called stating that the pharmacy has not received these prescriptions for her and they need to be resent.  acetaminophen  (TYLENOL ) 500 MG tablet methocarbamol  (ROBAXIN ) 500 MG tablet predniSONE  (DELTASONE ) 20 MG tablet  [id number] [place], [place] - [address] AT [facility] [address] [facility] [address] [place] [address] Phone: [phone]  Fax: [phone]

## 2024-07-04 NOTE — Telephone Encounter (Signed)
 Darlean Maus, NP to Me (Selected Message)     07/04/24  2:45 PM Lets see how she does with tylenol  and prednisone  first  We can hold off on the Robaxin  since it is not covered.  I am concerned about side effects with the muscle relaxers   Patient is aware of providers concerns, stated she was busy, and ended call quickly.

## 2024-07-04 NOTE — Telephone Encounter (Signed)
 Stephanie Baker had placed these orders on Mon. Please call [facility]. They should have these orders

## 2024-07-16 ENCOUNTER — Encounter: Payer: Self-pay | Admitting: Adult Health

## 2024-07-16 ENCOUNTER — Non-Acute Institutional Stay: Admitting: Adult Health

## 2024-07-16 VITALS — BP 124/70 | HR 75 | Temp 98.6°F | Ht 64.0 in | Wt 135.0 lb

## 2024-07-16 DIAGNOSIS — G8929 Other chronic pain: Secondary | ICD-10-CM

## 2024-07-16 DIAGNOSIS — M545 Low back pain, unspecified: Secondary | ICD-10-CM | POA: Diagnosis not present

## 2024-07-16 MED ORDER — ESTROGENS CONJUGATED 0.625 MG/GM VA CREA: 1.0000 | TOPICAL_CREAM | VAGINAL | 3 refills | Status: DC

## 2024-07-16 MED ORDER — TRAMADOL HCL 50 MG PO TABS: 25.0000 mg | ORAL_TABLET | Freq: Two times a day (BID) | ORAL | 0 refills | Status: AC | PRN

## 2024-07-16 MED ORDER — LIDOCAINE 4 % EX PTCH: 1.0000 | MEDICATED_PATCH | CUTANEOUS | 1 refills | Status: DC

## 2024-07-16 NOTE — Progress Notes (Signed)
 Location:  [facility]  POS: Clinic  Provider: Tawni America, ANP   Goals of Care:     12/06/2023    9:45 AM  Advanced Directives  Does Patient Have a Medical Advance Directive? Yes  Type of Estate agent of [place];Living will;Out of facility DNR (pink MOST or yellow form)  Does patient want to make changes to medical advance directive? No - Patient declined  Copy of Healthcare Power of Attorney in Chart? Yes - validated most recent copy scanned in chart (See row information)     Chief Complaint  Patient presents with   Follow-up    2 week follow up. Back is sore.    HPI:   History of Present Illness   Stephanie Baker is an 83 year old female who presents for follow-up on her chronic back pain management.  She experiences persistent low back pain that has not improved with previous treatments, including prednisone , surgery and injections.  She was referred to a physiatrist, but the appointment was not scheduled until the fall, leading her to abandon that option.  Was prescribed scheduled tylenol  but did not take it regularly.  The back pain significantly impacts her daily life, and she describes it as severe enough to consider stronger medication. However, she prefers to avoid medication due to side effects such as feeling 'dumbheaded.' She has not tried muscle relaxers like methocarbamol  due to insurance issues and has not used tramadol  or other medications because she generally avoids taking drugs.  She has not used lidocaine  patches due to difficulty applying them herself, but acknowledges that they might help if she can reach the affected area. The pain is located in her back. She is considering trying topical treatments that do not have systemic effects.  Regarding her mood, she acknowledges feeling depressed but does not believe it contributes to her pain. She reports adequate sleep, possibly too much, and a normal appetite. She  participates in community activities like playing bridge twice a week, although she sometimes lacks motivation to get things done.  She reports constant burning and irritation to the vaginal area. Previously examined last visit and felt to be due to vaginal atrophy. No drainage. Prior UA negative.       Prior to from neurosurgery 03/2024 Note per neurosurgery 04/19/24 S/p T11-L4 reinstrumentation, T12-L1 through L2-3 SPO's [facility] 04/28/22) 2  Injections  02/24/23 CT guided bilateral SI joint injections [facility]) - no relief 04/06/24 bilateral L3,L4, L5 MBB (Dr. Arminda)   She tried multiple measures with no relief.  Recommended physiatry consult with Dr Claudene   Past Medical History:  Diagnosis Date   Arthritis    OA   GERD (gastroesophageal reflux disease)    Hematuria    idiopathic- Dr. [facility]   Hyperlipemia    Hypertension    Hypothyroidism     Past Surgical History:  Procedure Laterality Date   ABDOMINAL HYSTERECTOMY     APPENDECTOMY     BACK SURGERY  2023   lumbar   EYE SURGERY     bilateral cataract extraction with IOL   GASTROC RECESSION EXTREMITY Left 02/23/2018   Procedure: Left Gastroc Recession;  Surgeon: Kit Rush, MD;  Location: [facility];  Service: Orthopedics;  Laterality: Left;   HAND SURGERY     left thumb MP   JOINT REPLACEMENT     right knee   PARTIAL KNEE ARTHROPLASTY  11/14/2012   Procedure: UNICOMPARTMENTAL KNEE;  Surgeon: Donnice JONETTA Car, MD;  Location: [facility];  Service: Orthopedics;  Laterality: Left;   TARSAL METATARSAL ARTHRODESIS Left 02/23/2018   Procedure: Left First and Second Tarsometatarsal Arthrodesis;  Surgeon: Kit Rush, MD;  Location: [facility];  Service: Orthopedics;  Laterality: Left;   thumb surgery     left   VAGINAL HYSTERECTOMY      Allergies  Allergen Reactions   Statins     Outpatient Encounter Medications as of 07/16/2024  Medication Sig   amLODipine  (NORVASC ) 5 MG  tablet Take 1 tablet (5 mg total) by mouth daily.   Bempedoic Acid-Ezetimibe (NEXLIZET ) 180-10 MG TABS TAKE 1 TABLET BY MOUTH EVERY DAY AT 12 NOON.   Biotin 5000 MCG CAPS Take 1 capsule by mouth daily.   Calcium Citrate-Vitamin D (CALCIUM CITRATE + D) 315-5 MG-MCG TABS Take 1 tablet by mouth in the morning and at bedtime.   conjugated estrogens  (PREMARIN ) vaginal cream Place 1 Applicatorful vaginally 3 (three) times a week.   levothyroxine  (SYNTHROID ) 25 MCG tablet Take 25 mcg by mouth once. 2 tablets (50 mcg), oral, once a morning.   lidocaine  4 % Place 1 patch onto the skin daily.   methocarbamol  (ROBAXIN ) 500 MG tablet Take 1 tablet (500 mg total) by mouth every 8 (eight) hours as needed for muscle spasms.   olmesartan -hydrochlorothiazide  (BENICAR  HCT) 40-12.5 MG tablet TAKE 1 TABLET BY MOUTH DAILY   omega-3 acid ethyl esters (LOVAZA) 1 g capsule Take 2 g by mouth 3 (three) times daily. 2 grams (2 caps), oral three times a day   potassium chloride  SA (KLOR-CON  M) 20 MEQ tablet TAKE 1 TABLET BY MOUTH EVERY DAY   predniSONE  (DELTASONE ) 20 MG tablet Take 1 tablet (20 mg total) by mouth daily with breakfast.   RESTASIS 0.05 % ophthalmic emulsion Place 1 drop into both eyes 2 (two) times daily.   traMADol  (ULTRAM ) 50 MG tablet Take 0.5-1 tablets (25-50 mg total) by mouth every 12 (twelve) hours as needed.   Turmeric 500 MG CAPS Take 500 mg by mouth daily.   [DISCONTINUED] acetaminophen  (TYLENOL ) 500 MG tablet Take 2 tablets (1,000 mg total) by mouth in the morning and at bedtime. (Patient not taking: Reported on 07/16/2024)   No facility-administered encounter medications on file as of 07/16/2024.    Review of Systems:  Review of Systems  Constitutional:  Negative for activity change, appetite change, chills, diaphoresis, fatigue and fever.  HENT:  Negative for congestion.   Respiratory:  Negative for cough, shortness of breath and wheezing.   Cardiovascular:  Negative for chest pain and leg  swelling.  Gastrointestinal:  Negative for abdominal distention, abdominal pain, constipation, diarrhea, nausea and vomiting.  Genitourinary:  Negative for difficulty urinating, dysuria, urgency, vaginal bleeding, vaginal discharge and vaginal pain.  Musculoskeletal:  Positive for back pain and gait problem. Negative for myalgias and neck pain.  Skin:  Negative for rash.  Neurological:  Negative for dizziness and weakness.  Psychiatric/Behavioral:  Positive for dysphoric mood. Negative for confusion.     Health Maintenance  Topic Date Due   COVID-19 Vaccine (8 - 2024-25 season) 10/17/2024 (Originally 12/13/2023)   INFLUENZA VACCINE  07/27/2024   Medicare Annual Wellness (AWV)  04/27/2025   Pneumococcal Vaccine: 50+ Years  Completed   DEXA SCAN  Completed   Zoster Vaccines- Shingrix  Completed   Hepatitis B Vaccines  Aged Out   HPV VACCINES  Aged Out   Meningococcal B Vaccine  Aged Out   DTaP/Tdap/Td  Discontinued  Physical Exam: Vitals:   07/16/24 1534  BP: 124/70  Pulse: 75  Temp: 98.6 F (37 C)  SpO2: 95%  Weight: 135 lb (61.2 kg)  Height: 5' 4 (1.626 m)   Body mass index is 23.17 kg/m. Physical Exam Vitals reviewed.  Constitutional:      General: She is not in acute distress.    Appearance: She is not diaphoretic.  HENT:     Head: Normocephalic and atraumatic.  Neck:     Vascular: No JVD.  Cardiovascular:     Rate and Rhythm: Normal rate and regular rhythm.     Heart sounds: No murmur heard. Pulmonary:     Effort: Pulmonary effort is normal. No respiratory distress.     Breath sounds: Normal breath sounds. No wheezing.  Abdominal:     General: Bowel sounds are normal.     Palpations: Abdomen is soft.  Musculoskeletal:        General: Tenderness (lumbar area) present.  Skin:    General: Skin is warm and dry.  Neurological:     Mental Status: She is alert and oriented to person, place, and time.  Psychiatric:     Comments: Flat      Labs  reviewed: Basic Metabolic Panel: Recent Labs    12/29/23 1055 04/03/24 0000  NA 141 135*  K 4.2 4.3  CL 105 102  CO2 20 21  GLUCOSE 82  --   BUN 37* 42*  CREATININE 0.93 0.9  CALCIUM 9.6 9.4  TSH  --  4.65   Liver Function Tests: Recent Labs    12/29/23 1055 04/03/24 0000  AST 20 25  ALT 10 15  ALKPHOS 53 49  BILITOT 0.4  --   PROT 6.5  --   ALBUMIN 4.1 4.1   No results for input(s): LIPASE, AMYLASE in the last 8760 hours. No results for input(s): AMMONIA in the last 8760 hours. CBC: Recent Labs    04/03/24 0000  WBC 5.1  HGB 13.2  HCT 38  PLT 336   Lipid Panel: Recent Labs    12/29/23 1055 04/03/24 0000  CHOL 138 138  HDL 59 56  LDLCALC 62 66  TRIG 90 80  LDLDIRECT 60  --    No results found for: HGBA1C  Procedures since last visit: No results found.  Assessment/Plan Assessment and Plan    Chronic Back Pain Chronic back pain persists despite previous treatments. Surgery and injections ineffective. She is hesitant about medication due to side effects. Tramadol  and lidocaine  patches not yet tried. - Prescribed tramadol  25 mg, advised to take half or whole tablet twice daily as needed for pain. No driving when taking - Prescribed lidocaine  patches, instructed to apply in the morning and remove in the evening on the area of pain.  Depression Reports depression with hypersomnia and lack of motivation. Symptoms may be related to chronic pain. She declined medication for depression, focusing on pain management first.  Atrophic Vaginitis Constant vaginal irritation likely due to postmenopausal estrogen deficiency. Dysuria present, but no infection signs. - Prescribed estrogen cream for vaginal application to alleviate irritation.         Labs/tests ordered:  * No order type specified * Next appt:  10/16/2024   Total time :  time greater than 50% of total time spent doing pt counseling and coordination of care

## 2024-07-16 NOTE — Patient Instructions (Signed)
 SABRA

## 2024-08-06 ENCOUNTER — Other Ambulatory Visit: Payer: Self-pay | Admitting: Cardiology

## 2024-08-06 DIAGNOSIS — Z789 Other specified health status: Secondary | ICD-10-CM

## 2024-08-06 DIAGNOSIS — I700 Atherosclerosis of aorta: Secondary | ICD-10-CM

## 2024-08-06 DIAGNOSIS — I2510 Atherosclerotic heart disease of native coronary artery without angina pectoris: Secondary | ICD-10-CM

## 2024-09-02 ENCOUNTER — Other Ambulatory Visit: Payer: Self-pay | Admitting: Cardiology

## 2024-09-02 DIAGNOSIS — I10 Essential (primary) hypertension: Secondary | ICD-10-CM

## 2024-09-10 ENCOUNTER — Other Ambulatory Visit: Payer: Self-pay | Admitting: Internal Medicine

## 2024-09-10 NOTE — Telephone Encounter (Signed)
 Medication has never been filled by patients pcp for levothyroxine  medication. Sent to pts pcp for further review

## 2024-10-11 LAB — COMPREHENSIVE METABOLIC PANEL WITH GFR
Albumin: 4 (ref 3.5–5.0)
Calcium: 9.8 (ref 8.7–10.7)
Globulin: 2.4
eGFR: 82

## 2024-10-11 LAB — BASIC METABOLIC PANEL WITH GFR
BUN: 36 — AB (ref 4–21)
CO2: 24 — AB (ref 13–22)
Chloride: 108 (ref 99–108)
Creatinine: 0.7 (ref 0.5–1.1)
Glucose: 103
Potassium: 4.4 meq/L (ref 3.5–5.1)
Sodium: 142 (ref 137–147)

## 2024-10-11 LAB — HEPATIC FUNCTION PANEL
ALT: 12 U/L (ref 7–35)
AST: 18 (ref 13–35)
Alkaline Phosphatase: 55 (ref 25–125)
Bilirubin, Total: 0.3

## 2024-10-11 LAB — CBC AND DIFFERENTIAL
HCT: 38 (ref 36–46)
Hemoglobin: 13.2 (ref 12.0–16.0)
Platelets: 291 K/uL (ref 150–400)
WBC: 7.2

## 2024-10-11 LAB — LIPID PANEL
Cholesterol: 221 — AB (ref 0–200)
HDL: 64 (ref 35–70)
LDL Cholesterol: 145
LDl/HDL Ratio: 3.5
Triglycerides: 60 (ref 40–160)

## 2024-10-11 LAB — CBC: RBC: 4.06 (ref 3.87–5.11)

## 2024-10-11 LAB — LAB REPORT - SCANNED
EGFR: 82
TSH: 1.62

## 2024-10-11 LAB — TSH: TSH: 1.62 (ref 0.41–5.90)

## 2024-10-16 ENCOUNTER — Non-Acute Institutional Stay: Admitting: Internal Medicine

## 2024-10-16 ENCOUNTER — Encounter: Payer: Self-pay | Admitting: Internal Medicine

## 2024-10-16 VITALS — BP 110/60 | HR 75 | Temp 97.7°F | Ht 64.0 in | Wt 130.0 lb

## 2024-10-16 DIAGNOSIS — Z1231 Encounter for screening mammogram for malignant neoplasm of breast: Secondary | ICD-10-CM

## 2024-10-16 DIAGNOSIS — I10 Essential (primary) hypertension: Secondary | ICD-10-CM

## 2024-10-16 DIAGNOSIS — E785 Hyperlipidemia, unspecified: Secondary | ICD-10-CM

## 2024-10-16 DIAGNOSIS — E2839 Other primary ovarian failure: Secondary | ICD-10-CM | POA: Diagnosis not present

## 2024-10-16 DIAGNOSIS — N949 Unspecified condition associated with female genital organs and menstrual cycle: Secondary | ICD-10-CM | POA: Diagnosis not present

## 2024-10-16 DIAGNOSIS — G8929 Other chronic pain: Secondary | ICD-10-CM

## 2024-10-16 DIAGNOSIS — M545 Low back pain, unspecified: Secondary | ICD-10-CM

## 2024-10-16 DIAGNOSIS — E039 Hypothyroidism, unspecified: Secondary | ICD-10-CM

## 2024-10-16 NOTE — Patient Instructions (Addendum)
 I am ordering Mammogram and DEXA in [facility] [phone] Make sure you take your Lovaza and Nexlizet  everyday Check BP once or twice a week and let me know the readings after 4 weeks

## 2024-10-17 NOTE — Progress Notes (Signed)
 Location:  [facility] features editor of Service:  Clinic (12)  Provider:   Code Status:  Goals of Care:     12/06/2023    9:45 AM  Advanced Directives  Does Patient Have a Medical Advance Directive? Yes  Type of Estate agent of [place];Living will;Out of facility DNR (pink MOST or yellow form)  Does patient want to make changes to medical advance directive? No - Patient declined  Copy of Healthcare Power of Attorney in Chart? Yes - validated most recent copy scanned in chart (See row information)     Chief Complaint  Patient presents with   Follow-up    6 month follow up Patient would like discuss back and shoulder pain    HPI: Patient is a 83 y.o. female seen today for medical management of chronic diseases.    Lives [place] in [facility]   Uses [employer] for [employer]. [address]   Chronic Dysuria Vaginal pain  Nothing has helped No Estrace  or Nystatin  Not using anything anymore Has seen Urologist  Nothing has changed She describes Perineal pain but does not want to do any thing for it   Lower Back pain Patient had Lumbar Fusion done in May 2023 She is status post T11-L4 reinstrumentation, T12-L1 through L2-3 SPO's from 04/28/22.  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 L3, L4, and L5 medial branch block. On 04/25  They Also recommended Turmeric, Vitamin D and Spinal Cord Stimulation She refused Does not even take Tylenol   Chest pain With SOB Sees Cardiology Echo normal Lexiscan  Low risk study Statins and BP control  Past Medical History:  Diagnosis Date   Arthritis    OA   GERD (gastroesophageal reflux disease)    Hematuria    idiopathic- Dr. [facility]   Hyperlipemia    Hypertension     Hypothyroidism     Past Surgical History:  Procedure Laterality Date   ABDOMINAL HYSTERECTOMY     APPENDECTOMY     BACK SURGERY  2023   lumbar   EYE SURGERY     bilateral cataract extraction with IOL   GASTROC RECESSION EXTREMITY Left 02/23/2018   Procedure: Left Gastroc Recession;  Surgeon: Kit Rush, MD;  Location: [facility];  Service: Orthopedics;  Laterality: Left;   HAND SURGERY     left thumb MP   JOINT REPLACEMENT     right knee   PARTIAL KNEE ARTHROPLASTY  11/14/2012   Procedure: UNICOMPARTMENTAL KNEE;  Surgeon: Donnice JONETTA Car, MD;  Location: [facility];  Service: Orthopedics;  Laterality: Left;   TARSAL METATARSAL ARTHRODESIS Left 02/23/2018   Procedure: Left First and Second Tarsometatarsal Arthrodesis;  Surgeon: Kit Rush, MD;  Location: [facility];  Service: Orthopedics;  Laterality: Left;   thumb surgery     left   VAGINAL HYSTERECTOMY      Allergies  Allergen Reactions   Statins     Outpatient Encounter Medications as of 10/16/2024  Medication Sig   amLODipine  (NORVASC ) 5 MG tablet TAKE 1 TABLET(5 MG) BY MOUTH DAILY   Bempedoic Acid-Ezetimibe (NEXLIZET ) 180-10 MG TABS TAKE 1 TABLET BY MOUTH EVERY DAY AT 12:00PM   Biotin 5000 MCG CAPS Take 1 capsule by mouth daily.  Calcium Citrate-Vitamin D (CALCIUM CITRATE + D) 315-5 MG-MCG TABS Take 1 tablet by mouth in the morning and at bedtime.   levothyroxine  (SYNTHROID ) 25 MCG tablet TAKE 1 TABLET(25 MCG) BY MOUTH DAILY   olmesartan -hydrochlorothiazide  (BENICAR  HCT) 40-12.5 MG tablet TAKE 1 TABLET BY MOUTH DAILY   omega-3 acid ethyl esters (LOVAZA) 1 g capsule Take 2 g by mouth 3 (three) times daily. 2 grams (2 caps), oral three times a day   potassium chloride  SA (KLOR-CON  M) 20 MEQ tablet TAKE 1 TABLET BY MOUTH EVERY DAY   RESTASIS 0.05 % ophthalmic emulsion Place 1 drop into both eyes 2 (two) times daily.   traMADol  (ULTRAM ) 50 MG tablet Take 0.5-1 tablets (25-50 mg total) by mouth  every 12 (twelve) hours as needed.   Turmeric 500 MG CAPS Take 500 mg by mouth daily.   [DISCONTINUED] conjugated estrogens  (PREMARIN ) vaginal cream Place 1 Applicatorful vaginally 3 (three) times a week. (Patient not taking: Reported on 10/16/2024)   [DISCONTINUED] lidocaine  4 % Place 1 patch onto the skin daily. (Patient not taking: Reported on 10/16/2024)   No facility-administered encounter medications on file as of 10/16/2024.    Review of Systems:  Review of Systems  Constitutional:  Negative for activity change and appetite change.  HENT: Negative.    Respiratory:  Negative for cough and shortness of breath.   Cardiovascular:  Negative for leg swelling.  Gastrointestinal:  Negative for constipation.  Genitourinary: Negative.   Musculoskeletal:  Positive for back pain and gait problem. Negative for arthralgias and myalgias.  Skin: Negative.   Neurological:  Negative for dizziness and weakness.  Psychiatric/Behavioral:  Negative for confusion, dysphoric mood and sleep disturbance.     Health Maintenance  Topic Date Due   COVID-19 Vaccine (9 - Pfizer risk 2025-26 season) 03/25/2025   Medicare Annual Wellness (AWV)  04/27/2025   Pneumococcal Vaccine: 50+ Years  Completed   Influenza Vaccine  Completed   DEXA SCAN  Completed   Zoster Vaccines- Shingrix  Completed   Meningococcal B Vaccine  Aged Out   DTaP/Tdap/Td  Discontinued    Physical Exam: Vitals:   10/16/24 0941  BP: 110/60  Pulse: 75  Temp: 97.7 F (36.5 C)  SpO2: 99%  Weight: 130 lb (59 kg)  Height: 5' 4 (1.626 m)   Body mass index is 22.31 kg/m. Physical Exam Vitals reviewed.  Constitutional:      Appearance: Normal appearance.  HENT:     Head: Normocephalic.     Nose: Nose normal.     Mouth/Throat:     Mouth: Mucous membranes are moist.     Pharynx: Oropharynx is clear.  Eyes:     Pupils: Pupils are equal, round, and reactive to light.  Cardiovascular:     Rate and Rhythm: Normal rate and  regular rhythm.     Pulses: Normal pulses.     Heart sounds: Normal heart sounds. No murmur heard. Pulmonary:     Effort: Pulmonary effort is normal.     Breath sounds: Normal breath sounds.  Abdominal:     General: Abdomen is flat. Bowel sounds are normal.     Palpations: Abdomen is soft.  Musculoskeletal:        General: No swelling.     Cervical back: Neck supple.  Skin:    General: Skin is warm.  Neurological:     Mental Status: She is alert and oriented to person, place, and time.  Psychiatric:  Mood and Affect: Mood normal.        Thought Content: Thought content normal.     Labs reviewed: Basic Metabolic Panel: Recent Labs    12/29/23 1055 04/03/24 0000 10/11/24 0000 10/11/24 0120  NA 141 135* 142  --   K 4.2 4.3 4.4  --   CL 105 102 108  --   CO2 20 21 24*  --   GLUCOSE 82  --   --   --   BUN 37* 42* 36*  --   CREATININE 0.93 0.9 0.7  --   CALCIUM 9.6 9.4 9.8  --   TSH  --  4.65 1.62 1.62   Liver Function Tests: Recent Labs    12/29/23 1055 04/03/24 0000 10/11/24 0000  AST 20 25 18   ALT 10 15 12   ALKPHOS 53 49 55  BILITOT 0.4  --   --   PROT 6.5  --   --   ALBUMIN 4.1 4.1 4.0   No results for input(s): LIPASE, AMYLASE in the last 8760 hours. No results for input(s): AMMONIA in the last 8760 hours. CBC: Recent Labs    04/03/24 0000 10/11/24 0000  WBC 5.1 7.2  HGB 13.2 13.2  HCT 38 38  PLT 336 291   Lipid Panel: Recent Labs    12/29/23 1055 04/03/24 0000 10/11/24 0000  CHOL 138 138 221*  HDL 59 56 64  LDLCALC 62 66 145  TRIG 90 80 60  LDLDIRECT 60  --   --    No results found for: HGBA1C  Procedures since last visit: No results found.  Assessment/Plan 1. Encounter for screening mammogram for malignant neoplasm of breast (Primary)  - MM 3D SCREENING MAMMOGRAM BILATERAL BREAST; Future  2. Estrogen deficiency  - DG Bone Density; Future  3. Chronic midline low back pain without sciatica Continues to be her  issue No More work up right now  4. Perineal discomfort in female No More work up per her request  5. Acquired hypothyroidism TSH normal  6. Primary hypertension Was slightly low She will get it checked by Facility nurse and if it is low will reduce the Norvasc   7. Hyperlipidemia, unspecified hyperlipidemia type LDL was high this time Will repeat labs She does not seem to remember missing any doses Nexlizet    Labs/tests ordered:  Labs  Next appt:  Visit date not found

## 2024-11-19 ENCOUNTER — Other Ambulatory Visit: Payer: Self-pay | Admitting: Internal Medicine

## 2024-11-19 DIAGNOSIS — I10 Essential (primary) hypertension: Secondary | ICD-10-CM

## 2024-11-19 NOTE — Telephone Encounter (Signed)
 Pharmacy requested refill Pended Rx and sent to Dr. Chales Abrahams for approval.

## 2024-12-07 ENCOUNTER — Encounter: Payer: Self-pay | Admitting: Adult Health

## 2024-12-07 MED ORDER — PHENAZOPYRIDINE HCL 100 MG PO TABS: 100.0000 mg | ORAL_TABLET | Freq: Three times a day (TID) | ORAL | 0 refills | Status: AC | PRN

## 2025-04-15 ENCOUNTER — Encounter: Payer: Self-pay | Admitting: Adult Health

## 2025-05-16 ENCOUNTER — Other Ambulatory Visit (HOSPITAL_BASED_OUTPATIENT_CLINIC_OR_DEPARTMENT_OTHER)
